# Patient Record
Sex: Male | Born: 1945 | Race: White | Hispanic: No | Marital: Married | Smoking: Current some day smoker
Health system: Southern US, Community
[De-identification: ages and names within clinical notes are randomized; demographics above are authoritative.]

## PROBLEM LIST (undated history)

## (undated) DIAGNOSIS — R911 Solitary pulmonary nodule: Secondary | ICD-10-CM

## (undated) DIAGNOSIS — I2510 Atherosclerotic heart disease of native coronary artery without angina pectoris: Secondary | ICD-10-CM

## (undated) DIAGNOSIS — F17200 Nicotine dependence, unspecified, uncomplicated: Secondary | ICD-10-CM

## (undated) DIAGNOSIS — S82853A Displaced trimalleolar fracture of unspecified lower leg, initial encounter for closed fracture: Secondary | ICD-10-CM

## (undated) DIAGNOSIS — I77810 Thoracic aortic ectasia: Secondary | ICD-10-CM

## (undated) DIAGNOSIS — M62830 Muscle spasm of back: Secondary | ICD-10-CM

## (undated) DIAGNOSIS — I82409 Acute embolism and thrombosis of unspecified deep veins of unspecified lower extremity: Secondary | ICD-10-CM

## (undated) DIAGNOSIS — I255 Ischemic cardiomyopathy: Secondary | ICD-10-CM

## (undated) DIAGNOSIS — R413 Other amnesia: Secondary | ICD-10-CM

## (undated) DIAGNOSIS — N529 Male erectile dysfunction, unspecified: Secondary | ICD-10-CM

## (undated) DIAGNOSIS — M7742 Metatarsalgia, left foot: Secondary | ICD-10-CM

## (undated) DIAGNOSIS — I420 Dilated cardiomyopathy: Secondary | ICD-10-CM

## (undated) DIAGNOSIS — C61 Malignant neoplasm of prostate: Secondary | ICD-10-CM

## (undated) DIAGNOSIS — L719 Rosacea, unspecified: Secondary | ICD-10-CM

## (undated) DIAGNOSIS — M1990 Unspecified osteoarthritis, unspecified site: Secondary | ICD-10-CM

## (undated) DIAGNOSIS — R296 Repeated falls: Secondary | ICD-10-CM

## (undated) DIAGNOSIS — M25469 Effusion, unspecified knee: Secondary | ICD-10-CM

## (undated) DIAGNOSIS — R2681 Unsteadiness on feet: Secondary | ICD-10-CM

## (undated) DIAGNOSIS — I10 Essential (primary) hypertension: Secondary | ICD-10-CM

## (undated) DIAGNOSIS — R3911 Hesitancy of micturition: Secondary | ICD-10-CM

## (undated) DIAGNOSIS — E789 Disorder of lipoprotein metabolism, unspecified: Secondary | ICD-10-CM

## (undated) DIAGNOSIS — M7740 Metatarsalgia, unspecified foot: Secondary | ICD-10-CM

## (undated) DIAGNOSIS — F0280 Dementia in other diseases classified elsewhere without behavioral disturbance: Secondary | ICD-10-CM

## (undated) DIAGNOSIS — I491 Atrial premature depolarization: Secondary | ICD-10-CM

## (undated) DIAGNOSIS — I441 Atrioventricular block, second degree: Secondary | ICD-10-CM

## (undated) DIAGNOSIS — E119 Type 2 diabetes mellitus without complications: Secondary | ICD-10-CM

## (undated) DIAGNOSIS — M1120 Other chondrocalcinosis, unspecified site: Secondary | ICD-10-CM

## (undated) DIAGNOSIS — M419 Scoliosis, unspecified: Secondary | ICD-10-CM

## (undated) DIAGNOSIS — C801 Malignant (primary) neoplasm, unspecified: Secondary | ICD-10-CM

## (undated) DIAGNOSIS — F1910 Other psychoactive substance abuse, uncomplicated: Secondary | ICD-10-CM

## (undated) DIAGNOSIS — Z87442 Personal history of urinary calculi: Secondary | ICD-10-CM

## (undated) DIAGNOSIS — E876 Hypokalemia: Secondary | ICD-10-CM

## (undated) DIAGNOSIS — M2040 Other hammer toe(s) (acquired), unspecified foot: Secondary | ICD-10-CM

## (undated) DIAGNOSIS — G4700 Insomnia, unspecified: Secondary | ICD-10-CM

## (undated) DIAGNOSIS — E785 Hyperlipidemia, unspecified: Secondary | ICD-10-CM

## (undated) DIAGNOSIS — N289 Disorder of kidney and ureter, unspecified: Secondary | ICD-10-CM

## (undated) HISTORY — DX: Acute embolism and thrombosis of unspecified deep veins of unspecified lower extremity: I82.409

## (undated) HISTORY — DX: Displaced trimalleolar fracture of unspecified lower leg, initial encounter for closed fracture: S82.853A

## (undated) HISTORY — DX: Disorder of kidney and ureter, unspecified: N28.9

## (undated) HISTORY — DX: Nicotine dependence, unspecified, uncomplicated: F17.200

## (undated) HISTORY — PX: FOOT FRACTURE SURGERY: SHX645

## (undated) HISTORY — DX: Other psychoactive substance abuse, uncomplicated: F19.10

## (undated) HISTORY — DX: Hyperlipidemia, unspecified: E78.5

## (undated) HISTORY — DX: Hypokalemia: E87.6

## (undated) HISTORY — DX: Other amnesia: R41.3

## (undated) HISTORY — DX: Unsteadiness on feet: R26.81

## (undated) HISTORY — DX: Metatarsalgia, left foot: M77.42

## (undated) HISTORY — DX: Dilated cardiomyopathy: I42.0

## (undated) HISTORY — DX: Type 2 diabetes mellitus without complications: E11.9

## (undated) HISTORY — DX: Male erectile dysfunction, unspecified: N52.9

## (undated) HISTORY — DX: Essential (primary) hypertension: I10

## (undated) HISTORY — DX: Atherosclerotic heart disease of native coronary artery without angina pectoris: I25.10

## (undated) HISTORY — DX: Atrioventricular block, second degree: I44.1

## (undated) HISTORY — DX: Atrial premature depolarization: I49.1

## (undated) HISTORY — DX: Other hammer toe(s) (acquired), unspecified foot: M20.40

## (undated) HISTORY — DX: Thoracic aortic ectasia: I77.810

## (undated) HISTORY — DX: Repeated falls: R29.6

## (undated) HISTORY — DX: Dementia in other diseases classified elsewhere, unspecified severity, without behavioral disturbance, psychotic disturbance, mood disturbance, and anxiety: F02.80

## (undated) HISTORY — DX: Insomnia, unspecified: G47.00

## (undated) HISTORY — PX: COLONOSCOPY: SHX174

## (undated) HISTORY — DX: Solitary pulmonary nodule: R91.1

## (undated) HISTORY — DX: Malignant neoplasm of prostate: C61

## (undated) HISTORY — DX: Other chondrocalcinosis, unspecified site: M11.20

## (undated) HISTORY — PX: FOOT SURGERY: SHX648

## (undated) HISTORY — DX: Rosacea, unspecified: L71.9

## (undated) HISTORY — DX: Hesitancy of micturition: R39.11

## (undated) HISTORY — PX: LITHOTRIPSY: SUR834

## (undated) HISTORY — DX: Scoliosis, unspecified: M41.9

## (undated) HISTORY — DX: Dilated cardiomyopathy: I25.5

## (undated) HISTORY — DX: Muscle spasm of back: M62.830

## (undated) HISTORY — DX: Effusion, unspecified knee: M25.469

## (undated) HISTORY — DX: Disorder of lipoprotein metabolism, unspecified: E78.9

---

## 1982-04-05 HISTORY — PX: VASECTOMY: SHX75

## 1988-04-05 HISTORY — PX: MOUTH SURGERY: SHX715

## 2005-08-19 ENCOUNTER — Emergency Department (HOSPITAL_COMMUNITY): Admission: EM | Admit: 2005-08-19 | Discharge: 2005-08-20 | Payer: Self-pay | Admitting: Emergency Medicine

## 2005-08-23 ENCOUNTER — Other Ambulatory Visit (HOSPITAL_COMMUNITY): Admission: RE | Admit: 2005-08-23 | Discharge: 2005-09-22 | Payer: Self-pay | Admitting: Psychiatry

## 2005-08-25 ENCOUNTER — Ambulatory Visit: Payer: Self-pay | Admitting: Psychiatry

## 2005-09-22 ENCOUNTER — Other Ambulatory Visit (HOSPITAL_COMMUNITY): Admission: RE | Admit: 2005-09-22 | Discharge: 2005-12-21 | Payer: Self-pay | Admitting: Psychiatry

## 2005-11-17 DIAGNOSIS — F1910 Other psychoactive substance abuse, uncomplicated: Secondary | ICD-10-CM

## 2005-11-17 HISTORY — DX: Other psychoactive substance abuse, uncomplicated: F19.10

## 2011-06-02 ENCOUNTER — Encounter: Payer: Self-pay | Admitting: Internal Medicine

## 2011-08-11 ENCOUNTER — Encounter: Payer: Self-pay | Admitting: Internal Medicine

## 2011-10-01 ENCOUNTER — Ambulatory Visit (AMBULATORY_SURGERY_CENTER): Payer: Managed Care, Other (non HMO) | Admitting: *Deleted

## 2011-10-01 ENCOUNTER — Encounter: Payer: Self-pay | Admitting: Internal Medicine

## 2011-10-01 VITALS — Ht 71.0 in | Wt 186.0 lb

## 2011-10-01 DIAGNOSIS — Z1211 Encounter for screening for malignant neoplasm of colon: Secondary | ICD-10-CM

## 2011-10-01 MED ORDER — MOVIPREP 100 G PO SOLR: ORAL | Status: DC

## 2011-10-15 ENCOUNTER — Encounter: Payer: Self-pay | Admitting: Internal Medicine

## 2011-10-15 ENCOUNTER — Ambulatory Visit (AMBULATORY_SURGERY_CENTER): Payer: Medicare Other | Admitting: Internal Medicine

## 2011-10-15 VITALS — BP 137/64 | HR 59 | Temp 97.3°F | Resp 20 | Ht 71.0 in | Wt 186.0 lb

## 2011-10-15 DIAGNOSIS — Z1211 Encounter for screening for malignant neoplasm of colon: Secondary | ICD-10-CM

## 2011-10-15 MED ORDER — SODIUM CHLORIDE 0.9 % IV SOLN: 500.0000 mL | INTRAVENOUS | Status: DC

## 2011-10-15 NOTE — Op Note (Signed)
[facility] [address] [place], [place]  [address]  COLONOSCOPY PROCEDURE REPORT  PATIENT:  Brent Griffin, Brent Griffin  MR#:  [id number] BIRTHDATE:  17-Apr-1945, 66 yrs. old  GENDER:  male ENDOSCOPIST:  Hedwig Morton. Juanda Chance, MD REF. BY:  Nolon Nations, M.D. PROCEDURE DATE:  10/15/2011 PROCEDURE:  Colonoscopy 19147 ASA CLASS:  Class II INDICATIONS:  colorectal cancer screening, average risk last colon 2003 MEDICATIONS:   MAC sedation, administered by CRNA, propofol (Diprivan) 200 mg  DESCRIPTION OF PROCEDURE:   After the risks and benefits and of the procedure were explained, informed consent was obtained. Digital rectal exam was performed and revealed no rectal masses. The LB CF-[id number] endoscope was introduced through the anus and advanced to the cecum, which was identified by both the appendix and ileocecal valve.  The quality of the prep was good, using MoviPrep.  The instrument was then slowly withdrawn as the colon was fully examined. <<PROCEDUREIMAGES>>  FINDINGS:  Mild diverticulosis was found in the sigmoid colon (see image1).  Internal Hemorrhoids were found (see image7 and image6). This was otherwise a normal examination of the colon (see image5, image4, image3, and image2).   Retroflexed views in the rectum revealed no abnormalities.    The scope was then withdrawn from the patient and the procedure completed.  COMPLICATIONS:  None ENDOSCOPIC IMPRESSION: 1) Mild diverticulosis in the sigmoid colon 2) Internal hemorrhoids 3) Otherwise normal examination RECOMMENDATIONS: 1) High fiber diet.  REPEAT EXAM:  In 10 year(s) for.  ______________________________ Hedwig Morton. Juanda Chance, MD  CC:  n. eSIGNED:   Hedwig Morton. Brodie at 10/15/2011 08:29 AM  Karrie Meres, [phone]

## 2011-10-15 NOTE — Progress Notes (Signed)
Patient did not experience any of the following events: a burn prior to discharge; a fall within the facility; wrong site/side/patient/procedure/implant event; or a hospital transfer or hospital admission upon discharge from the facility. (G8907) Patient did not have preoperative order for IV antibiotic SSI prophylaxis. (G8918)  

## 2011-10-15 NOTE — Patient Instructions (Addendum)
Discharge instructions given with verbal understanding. Handouts on diverticulosis and hemorrhoids given. Resume previous medications. YOU HAD AN ENDOSCOPIC PROCEDURE TODAY AT THE  ENDOSCOPY CENTER: Refer to the procedure report that was given to you for any specific questions about what was found during the examination.  If the procedure report does not answer your questions, please call your gastroenterologist to clarify.  If you requested that your care partner not be given the details of your procedure findings, then the procedure report has been included in a sealed envelope for you to review at your convenience later.  YOU SHOULD EXPECT: Some feelings of bloating in the abdomen. Passage of more gas than usual.  Walking can help get rid of the air that was put into your GI tract during the procedure and reduce the bloating. If you had a lower endoscopy (such as a colonoscopy or flexible sigmoidoscopy) you may notice spotting of blood in your stool or on the toilet paper. If you underwent a bowel prep for your procedure, then you may not have a normal bowel movement for a few days.  DIET: Your first meal following the procedure should be a light meal and then it is ok to progress to your normal diet.  A half-sandwich or bowl of soup is an example of a good first meal.  Heavy or fried foods are harder to digest and may make you feel nauseous or bloated.  Likewise meals heavy in dairy and vegetables can cause extra gas to form and this can also increase the bloating.  Drink plenty of fluids but you should avoid alcoholic beverages for 24 hours.  ACTIVITY: Your care partner should take you home directly after the procedure.  You should plan to take it easy, moving slowly for the rest of the day.  You can resume normal activity the day after the procedure however you should NOT DRIVE or use heavy machinery for 24 hours (because of the sedation medicines used during the test).    SYMPTOMS TO REPORT  IMMEDIATELY: A gastroenterologist can be reached at any hour.  During normal business hours, 8:30 AM to 5:00 PM Monday through Friday, call [phone].  After hours and on weekends, please call the GI answering service at [phone] who will take a message and have the physician on call contact you.   Following lower endoscopy (colonoscopy or flexible sigmoidoscopy):  Excessive amounts of blood in the stool  Significant tenderness or worsening of abdominal pains  Swelling of the abdomen that is new, acute  Fever of 100F or higher  FOLLOW UP: If any biopsies were taken you will be contacted by phone or by letter within the next 1-3 weeks.  Call your gastroenterologist if you have not heard about the biopsies in 3 weeks.  Our staff will call the home number listed on your records the next business day following your procedure to check on you and address any questions or concerns that you may have at that time regarding the information given to you following your procedure. This is a courtesy call and so if there is no answer at the home number and we have not heard from you through the emergency physician on call, we will assume that you have returned to your regular daily activities without incident.  SIGNATURES/CONFIDENTIALITY: You and/or your care partner have signed paperwork which will be entered into your electronic medical record.  These signatures attest to the fact that that the information above on your After Visit   Summary has been reviewed and is understood.  Full responsibility of the confidentiality of this discharge information lies with you and/or your care-partner. 

## 2011-10-15 NOTE — Progress Notes (Signed)
Propofol per j nulty crna. See scanned intra procedure report. ewm 

## 2011-10-18 ENCOUNTER — Telehealth: Payer: Self-pay | Admitting: *Deleted

## 2011-10-18 NOTE — Telephone Encounter (Signed)
  No answer at number given.  Let message on voicemail.

## 2013-05-13 DIAGNOSIS — E119 Type 2 diabetes mellitus without complications: Secondary | ICD-10-CM | POA: Insufficient documentation

## 2013-12-17 DIAGNOSIS — M62838 Other muscle spasm: Secondary | ICD-10-CM | POA: Insufficient documentation

## 2014-01-06 DIAGNOSIS — R972 Elevated prostate specific antigen [PSA]: Secondary | ICD-10-CM | POA: Insufficient documentation

## 2014-02-12 DIAGNOSIS — I10 Essential (primary) hypertension: Secondary | ICD-10-CM | POA: Insufficient documentation

## 2014-02-13 DIAGNOSIS — M419 Scoliosis, unspecified: Secondary | ICD-10-CM | POA: Insufficient documentation

## 2014-02-13 DIAGNOSIS — M62830 Muscle spasm of back: Secondary | ICD-10-CM | POA: Insufficient documentation

## 2014-02-13 DIAGNOSIS — L719 Rosacea, unspecified: Secondary | ICD-10-CM | POA: Insufficient documentation

## 2014-08-27 DIAGNOSIS — E1169 Type 2 diabetes mellitus with other specified complication: Secondary | ICD-10-CM | POA: Insufficient documentation

## 2014-08-27 DIAGNOSIS — E785 Hyperlipidemia, unspecified: Secondary | ICD-10-CM

## 2014-10-11 ENCOUNTER — Encounter: Payer: Self-pay | Admitting: Internal Medicine

## 2016-01-16 DIAGNOSIS — C61 Malignant neoplasm of prostate: Secondary | ICD-10-CM | POA: Insufficient documentation

## 2016-04-05 HISTORY — PX: ROTATOR CUFF REPAIR: SHX139

## 2016-05-25 ENCOUNTER — Ambulatory Visit (INDEPENDENT_AMBULATORY_CARE_PROVIDER_SITE_OTHER): Payer: Medicare Other | Admitting: Podiatry

## 2016-05-25 ENCOUNTER — Encounter: Payer: Self-pay | Admitting: Podiatry

## 2016-05-25 VITALS — BP 141/85 | HR 70 | Ht 70.0 in | Wt 197.0 lb

## 2016-05-25 DIAGNOSIS — B351 Tinea unguium: Secondary | ICD-10-CM

## 2016-05-25 DIAGNOSIS — M2040 Other hammer toe(s) (acquired), unspecified foot: Secondary | ICD-10-CM

## 2016-05-25 DIAGNOSIS — Q828 Other specified congenital malformations of skin: Secondary | ICD-10-CM

## 2016-05-25 DIAGNOSIS — M79672 Pain in left foot: Secondary | ICD-10-CM

## 2016-05-25 DIAGNOSIS — M79671 Pain in right foot: Secondary | ICD-10-CM | POA: Diagnosis not present

## 2016-05-25 DIAGNOSIS — M21969 Unspecified acquired deformity of unspecified lower leg: Secondary | ICD-10-CM | POA: Diagnosis not present

## 2016-05-25 NOTE — Patient Instructions (Signed)
Seen for painful calluses and evaluation for orthotics. X-ray findings reveal short first ray with elevated position. Contracted lesser digits with digital corns. All lesions debrided. Both feet casted for orthotics.

## 2016-05-25 NOTE — Progress Notes (Signed)
SUBJECTIVE: 71 y.o. year old male presents requesting a new pair of orthotics.  Patient brought in a homemade orthotics that was made by [facility] that lasted 4 years. He has had podiatrist x 2 who were retired. Stated that he has on going callus problem on bottom of both feet, always had bad ankle that turns easily. Does a lot of walking in woods with dog.  REVIEW OF SYSTEMS: A comprehensive review of systems was negative except for: Currently going through Prostate cancer treatment with Radiation therapy, Type II diabetic under control, and Scoliosis. Positive of on going foot calluses.  OBJECTIVE: DERMATOLOGIC EXAMINATION: Digital keratotic skin lesions at distal end of 3rd digiti bilateral. Keratotic lesions sub 1 bilateral, sub 3 left, sub 4 right, distal clivi 3rd bilateral.  VASCULAR EXAMINATION OF LOWER LIMBS: All pedal pulses are palpable with normal pulsation.  No edema or erythema noted. Temperature gradient from tibial crest to dorsum of foot is within normal bilateral.  NEUROLOGIC EXAMINATION OF THE LOWER LIMBS: All epicritic and tactile sensations grossly intact.   MUSCULOSKELETAL EXAMINATION: Positive for hypermobile first ray bilateral. Severe digital contracture 2-5 bilateral.   RADIOGRAPHIC STUDIES:  AP View:  Short first and long 2nd and 3rd metatarsal bilateral: Lateral view:  High arch foot with plantar calcaneal spur bilateral. Elevated first ray right.  ASSESSMENT: Multiple digital contracture with digital lesions.  Pain under plantar callus. Hypermobile first ray bilateral.  PLAN: Reviewed clinical findings and available treatment options. All lesions debrided. Both feet casted for orthotics.

## 2016-07-21 ENCOUNTER — Ambulatory Visit (INDEPENDENT_AMBULATORY_CARE_PROVIDER_SITE_OTHER): Payer: Medicare Other | Admitting: Podiatry

## 2016-07-21 ENCOUNTER — Encounter: Payer: Self-pay | Admitting: Podiatry

## 2016-07-21 DIAGNOSIS — M79672 Pain in left foot: Secondary | ICD-10-CM

## 2016-07-21 DIAGNOSIS — M79671 Pain in right foot: Secondary | ICD-10-CM | POA: Diagnosis not present

## 2016-07-21 DIAGNOSIS — S93402A Sprain of unspecified ligament of left ankle, initial encounter: Secondary | ICD-10-CM

## 2016-07-21 DIAGNOSIS — Q661 Congenital talipes calcaneovarus, unspecified foot: Secondary | ICD-10-CM

## 2016-07-21 DIAGNOSIS — B351 Tinea unguium: Secondary | ICD-10-CM

## 2016-07-21 DIAGNOSIS — Q828 Other specified congenital malformations of skin: Secondary | ICD-10-CM

## 2016-07-21 DIAGNOSIS — M21969 Unspecified acquired deformity of unspecified lower leg: Secondary | ICD-10-CM | POA: Diagnosis not present

## 2016-07-21 NOTE — Progress Notes (Signed)
SUBJECTIVE: 71 y.o. year old male presents stating Orthotics are doing great except his left ankle wants to turn in. Something need be done to prevent it.  Patient also request calluses and nails trimmed.  He is to have shoulder surgery tomorrow.  REVIEW OF SYSTEMS: A comprehensive review of systems was negative except for: Currently going through Prostate cancer treatment with Radiation therapy, Type II diabetic under control, and Scoliosis. Positive of on going foot calluses.  OBJECTIVE: DERMATOLOGIC EXAMINATION: Digital keratotic skin lesions at distal end of 3rd digiti bilateral. Keratotic lesions sub 1 bilateral, sub 3 left, sub 4 right, distal clivi 3rd bilateral.  VASCULAR EXAMINATION OF LOWER LIMBS: All pedal pulses are palpable with normal pulsation.  No edema or erythema noted. Temperature gradient from tibial crest to dorsum of foot is within normal bilateral.  NEUROLOGIC EXAMINATION OF THE LOWER LIMBS: All epicritic and tactile sensations grossly intact.   MUSCULOSKELETAL EXAMINATION: Positive for hypermobile first ray bilateral. Severe digital contracture 2-5 bilateral.  Rearfoot varus left with frequent turning.   RADIOGRAPHIC STUDIES:  AP View:  Short first and long 2nd and 3rd metatarsal bilateral: Lateral view:  High arch foot with plantar calcaneal spur bilateral. Elevated first ray right.  ASSESSMENT: Multiple digital contracture with digital lesions.  Pain under plantar callus. Hypermobile first ray bilateral. 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   PLAN: Reviewed clinical findings and available treatment options. All lesions and nails debrided. Ankle brace dispensed to prevent ankle sprain left.  Return in 2 month for palliation.

## 2016-07-21 NOTE — Patient Instructions (Signed)
Doing good with orthotics. All nails and calluses debrided. Ankle brace dispensed for problematic left ankle that turns in. Return in 2 months for RFC.

## 2016-07-29 ENCOUNTER — Ambulatory Visit: Payer: Medicare Other | Admitting: Podiatry

## 2016-09-14 ENCOUNTER — Ambulatory Visit (INDEPENDENT_AMBULATORY_CARE_PROVIDER_SITE_OTHER): Payer: Medicare Other | Admitting: Podiatry

## 2016-09-14 ENCOUNTER — Encounter: Payer: Self-pay | Admitting: Podiatry

## 2016-09-14 DIAGNOSIS — M79671 Pain in right foot: Secondary | ICD-10-CM | POA: Diagnosis not present

## 2016-09-14 DIAGNOSIS — Q828 Other specified congenital malformations of skin: Secondary | ICD-10-CM

## 2016-09-14 DIAGNOSIS — M79672 Pain in left foot: Secondary | ICD-10-CM

## 2016-09-14 DIAGNOSIS — B351 Tinea unguium: Secondary | ICD-10-CM

## 2016-09-14 NOTE — Progress Notes (Signed)
SUBJECTIVE: 71 y.o.year old malepresents requesting calluses and nails trimmed.   He is wearing Orthotics and left ankle brace, and doing well. Has had shoulder sugery right side 07/22/16 and recovering well.  OBJECTIVE: DERMATOLOGIC EXAMINATION: Digital keratotic skin lesions at distal end of 3rd digiti bilateral. Keratotic lesions sub 1 bilateral, sub 3 left, sub 4 right, distal clivi 3rd bilateral. Hypertrophic nails with fungal debris x 10.  VASCULAR EXAMINATION OF LOWER LIMBS: All pedal pulses are palpable with normal pulsation.  No edema or erythema noted. Temperature gradient from tibial crest to dorsum of foot is within normal bilateral.  NEUROLOGIC EXAMINATION OF THE LOWER LIMBS: All epicritic and tactile sensations grossly intact.   MUSCULOSKELETAL EXAMINATION: Positive for hypermobile first ray bilateral. Severe digital contracture 2-5 bilateral.  Rearfoot varus left with frequent turning.    ASSESSMENT: Onychomycosis x 10. Multiple digital contracture with digital lesions.  Pain under plantar callus. 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   PLAN: Reviewed clinical findings and available treatment options. All lesions and nails debrided. Return in 2 month for palliation.

## 2016-09-14 NOTE — Patient Instructions (Signed)
Seen for hypertrophic nails and painful calluses. All nails and calluses debrided. Return in 2 months or as needed.

## 2016-11-03 ENCOUNTER — Ambulatory Visit (INDEPENDENT_AMBULATORY_CARE_PROVIDER_SITE_OTHER): Payer: Medicare Other | Admitting: Podiatry

## 2016-11-03 ENCOUNTER — Encounter: Payer: Self-pay | Admitting: Podiatry

## 2016-11-03 DIAGNOSIS — M79672 Pain in left foot: Secondary | ICD-10-CM | POA: Diagnosis not present

## 2016-11-03 DIAGNOSIS — M79671 Pain in right foot: Secondary | ICD-10-CM

## 2016-11-03 DIAGNOSIS — B351 Tinea unguium: Secondary | ICD-10-CM | POA: Diagnosis not present

## 2016-11-03 NOTE — Patient Instructions (Signed)
Seen for hypertrophic nails. All nails debrided. Return in 2 months or as needed.  

## 2016-11-03 NOTE — Progress Notes (Signed)
SUBJECTIVE: 71 y.o.year old malepresents requesting calluses and nails trimmed. He was seen 2 months ago for routine foot care and requested to return in 2 month again.  He is wearing Orthotics and left ankle brace, and doing well. Has had shoulder sugery right side 07/22/16 and recovering well.  OBJECTIVE: DERMATOLOGIC EXAMINATION: Digital keratotic skin lesions at distal end of 3rd digiti bilateral. Keratotic lesions sub 1 bilateral, sub 3 left, sub 4 right, distal clivi 3rd bilateral. Hypertrophic nails with fungal debris x 10.  VASCULAR EXAMINATION OF LOWER LIMBS: All pedal pulses are palpable with normal pulsation.  No edema or erythema noted. Temperature gradient from tibial crest to dorsum of foot is within normal bilateral.  NEUROLOGIC EXAMINATION OF THE LOWER LIMBS: All epicritic and tactile sensations grossly intact.   MUSCULOSKELETAL EXAMINATION: Positive for hypermobile first ray bilateral. Severe digital contracture 2-5 bilateral.  Rearfoot varus left with frequent turning.    ASSESSMENT: Onychomycosis x 10. Multiple digital contracture with digital lesions.  Pain under plantar callus. 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   PLAN: Reviewed clinical findings and available treatment options. All lesions and nails debrided. Bleeding nail cleansed with iodine and dressing applied. Return in 2 month for palliation.

## 2016-11-09 ENCOUNTER — Ambulatory Visit: Payer: Medicare Other | Admitting: Podiatry

## 2017-01-03 ENCOUNTER — Ambulatory Visit: Payer: Medicare Other | Admitting: Podiatry

## 2017-02-22 ENCOUNTER — Ambulatory Visit: Payer: Medicare Other | Admitting: Podiatry

## 2017-02-28 ENCOUNTER — Ambulatory Visit (INDEPENDENT_AMBULATORY_CARE_PROVIDER_SITE_OTHER): Payer: Medicare Other | Admitting: Podiatry

## 2017-02-28 ENCOUNTER — Encounter: Payer: Self-pay | Admitting: Podiatry

## 2017-02-28 DIAGNOSIS — M79672 Pain in left foot: Secondary | ICD-10-CM | POA: Diagnosis not present

## 2017-02-28 DIAGNOSIS — B351 Tinea unguium: Secondary | ICD-10-CM

## 2017-02-28 DIAGNOSIS — M79671 Pain in right foot: Secondary | ICD-10-CM | POA: Diagnosis not present

## 2017-02-28 DIAGNOSIS — M2040 Other hammer toe(s) (acquired), unspecified foot: Secondary | ICD-10-CM

## 2017-02-28 DIAGNOSIS — M21969 Unspecified acquired deformity of unspecified lower leg: Secondary | ICD-10-CM

## 2017-02-28 NOTE — Patient Instructions (Signed)
Seen for hypertrophic nails and calluses. All nails and calluses debrided. Return in 1 month.

## 2017-02-28 NOTE — Progress Notes (Signed)
Subjective: 71 y.o. year old male patient presents complaining of painful nails. Patient requests toe nails and calluses trimmed.  Been using scrub stick to remove callus from the ball of left foot for the last 2 weeks. He has a vacation planned in January and expect to be on feet long hours. Last blood sugar reading was 84 yesterday.   Hx of right shoulder surgery 07/22/16 and has weak hand on right side. Been wearing Orthotics and left ankle brace with satisfaction.  Objective: Dermatologic: Digital keratotic lesions 3rd distal end bilateral. Porokeratosis under 1st MPJ bilateral, under 3rd MPJ left, 4th MPJ right, plantar heel left. Thick dystrophic nails x 10. Vascular: Pedal pulses are all palpable. Orthopedic: Contracted lesser digits 2-5 bilateral. Hypermobile first ray bilateral. Compensated rearfoot varus left. Neurologic: All epicritic and tactile sensations grossly intact.  Assessment: Dystrophic mycotic nails x 10. Pre ulcerative porokeratotic lesions plantar bilateral with digital lesions bilateral. 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 Diabetic under control.  Treatment: All mycotic nails, corns, calluses debrided.  Return in one month to repeat trimming before taking off for vacation.

## 2017-04-12 ENCOUNTER — Encounter: Payer: Self-pay | Admitting: Podiatry

## 2017-04-12 ENCOUNTER — Ambulatory Visit (INDEPENDENT_AMBULATORY_CARE_PROVIDER_SITE_OTHER): Payer: Medicare Other | Admitting: Podiatry

## 2017-04-12 DIAGNOSIS — M79672 Pain in left foot: Secondary | ICD-10-CM

## 2017-04-12 DIAGNOSIS — Q828 Other specified congenital malformations of skin: Secondary | ICD-10-CM | POA: Diagnosis not present

## 2017-04-12 DIAGNOSIS — M79671 Pain in right foot: Secondary | ICD-10-CM | POA: Diagnosis not present

## 2017-04-12 DIAGNOSIS — M21969 Unspecified acquired deformity of unspecified lower leg: Secondary | ICD-10-CM | POA: Diagnosis not present

## 2017-04-12 DIAGNOSIS — B351 Tinea unguium: Secondary | ICD-10-CM | POA: Diagnosis not present

## 2017-04-12 NOTE — Patient Instructions (Signed)
Seen for painful calluses and hypertrophic nails. All nails and calluses debrided. Return 6 weeks.

## 2017-04-12 NOTE — Progress Notes (Signed)
Subjective: 72 y.o. year old male patient presents complaining of pain in bottom of both feet since the end of December. He was seen on 02/28/17.   Hx of right shoulder surgery 07/22/16 and has weak hand on right side. Been wearing Orthotics and left ankle brace with satisfaction.  Objective: Dermatologic: Pre ulcerative keratotic lesion at distal end 3rd digit bilateral. Severe porokeratotic lesion sub 1 bilateral, under 3rd MPJ left, 4th MPJ right. Hypertrophic nails x 10. Vascular: Pedal pulses are all palpable. Orthopedic: Contracted lesser digits 2-5 bilateral. Hypermobile first ray bilateral. Compensated rearfoot varus left. Neurologic: All epicritic and tactile sensations grossly intact.  Assessment: Dystrophic mycotic nails x 10.  Treatment: All mycotic nails, corns, calluses debrided.  Return in 6 weeks or sooner if needed.

## 2017-05-24 ENCOUNTER — Ambulatory Visit (INDEPENDENT_AMBULATORY_CARE_PROVIDER_SITE_OTHER): Payer: Medicare Other | Admitting: Podiatry

## 2017-05-24 ENCOUNTER — Encounter: Payer: Self-pay | Admitting: Podiatry

## 2017-05-24 DIAGNOSIS — M79672 Pain in left foot: Secondary | ICD-10-CM | POA: Diagnosis not present

## 2017-05-24 DIAGNOSIS — B351 Tinea unguium: Secondary | ICD-10-CM

## 2017-05-24 DIAGNOSIS — M79671 Pain in right foot: Secondary | ICD-10-CM | POA: Diagnosis not present

## 2017-05-24 NOTE — Patient Instructions (Signed)
Seen for hypertrophic nails and painful calluses. All nails and calluses debrided. Return in 3 months or as needed.  

## 2017-05-24 NOTE — Progress Notes (Signed)
Subjective: 72 y.o. year old male patient presents complaining of painful nails and calluses. Patient requests toe nails, corns and calluses trimmed. Had a trip and did well.  Hx of right shoulder surgery 07/22/16 and has weak hand on right side. Been wearing Orthotics and left ankle brace with satisfaction.  Objective: Dermatologic: Thick yellow deformed nails x 10. Plantar callus distal end 3rd bilateral. Porokeratosis under 3rd and 4th MPJ right. Vascular: Pedal pulses are all palpable. Orthopedic: Contracted lesser digits  2-5 bilateral. Hypermobile first ray bilateral. Compensated rearfoot varus left. Neurologic: All epicritic and tactile sensations grossly intact.  Assessment: Dystrophic mycotic nails x 10. Painful porokeratosis. Digital contracture with painful corns and calluses.  Treatment: All mycotic nails, corns, calluses debrided.  Return in 3 months or sooner if needed.

## 2017-08-22 ENCOUNTER — Ambulatory Visit: Payer: Medicare Other | Admitting: Podiatry

## 2017-09-08 DIAGNOSIS — R3911 Hesitancy of micturition: Secondary | ICD-10-CM | POA: Insufficient documentation

## 2017-09-14 DIAGNOSIS — N5235 Erectile dysfunction following radiation therapy: Secondary | ICD-10-CM | POA: Insufficient documentation

## 2017-09-26 ENCOUNTER — Encounter: Payer: Self-pay | Admitting: Podiatry

## 2017-09-26 ENCOUNTER — Ambulatory Visit (INDEPENDENT_AMBULATORY_CARE_PROVIDER_SITE_OTHER): Payer: Medicare Other | Admitting: Podiatry

## 2017-09-26 DIAGNOSIS — B351 Tinea unguium: Secondary | ICD-10-CM

## 2017-09-26 DIAGNOSIS — M79672 Pain in left foot: Secondary | ICD-10-CM | POA: Diagnosis not present

## 2017-09-26 DIAGNOSIS — M79671 Pain in right foot: Secondary | ICD-10-CM | POA: Diagnosis not present

## 2017-09-26 NOTE — Progress Notes (Signed)
Subjective: 72 y.o. year old male patient presents requesting toe nails, corns and calluses trimmed. He is on his way out abroad next week.  Hx of right shoulder surgery 07/22/16 and has weak hand on right side. Been wearing Orthotics and left ankle brace with satisfaction.  Objective: Dermatologic: Thick yellow deformed nails x 10. Plantar callus distal end 3rd bilateral. Porokeratosis under 3rd and 4th MPJ right. Vascular: Pedal pulses are all palpable. Orthopedic: Contracted lesser digits  2-5 bilateral. Hypermobile first ray bilateral. Compensated rearfoot varus left. Neurologic: All epicritic and tactile sensations grossly intact.  Assessment: Dystrophic mycotic nails x 10. Painful porokeratosis. Digital contracture with painful corns and calluses.  Treatment: All mycotic nails, corns, calluses debrided.  Return in 3 months or sooner if needed.

## 2017-09-26 NOTE — Patient Instructions (Signed)
Seen for hypertrophic nails, corns, and calluses. All nails, corns, and calluses debrided. Return in 3 months or as needed.  

## 2017-12-01 DIAGNOSIS — M542 Cervicalgia: Secondary | ICD-10-CM | POA: Insufficient documentation

## 2018-03-10 DIAGNOSIS — F17200 Nicotine dependence, unspecified, uncomplicated: Secondary | ICD-10-CM | POA: Insufficient documentation

## 2018-03-10 DIAGNOSIS — F5102 Adjustment insomnia: Secondary | ICD-10-CM | POA: Insufficient documentation

## 2018-03-10 DIAGNOSIS — N289 Disorder of kidney and ureter, unspecified: Secondary | ICD-10-CM | POA: Insufficient documentation

## 2018-03-13 DIAGNOSIS — E876 Hypokalemia: Secondary | ICD-10-CM | POA: Insufficient documentation

## 2018-04-05 DIAGNOSIS — C801 Malignant (primary) neoplasm, unspecified: Secondary | ICD-10-CM

## 2018-04-05 HISTORY — DX: Malignant (primary) neoplasm, unspecified: C80.1

## 2018-04-19 ENCOUNTER — Other Ambulatory Visit: Payer: Self-pay

## 2018-04-20 ENCOUNTER — Encounter: Payer: Self-pay | Admitting: Podiatry

## 2018-04-20 ENCOUNTER — Ambulatory Visit (INDEPENDENT_AMBULATORY_CARE_PROVIDER_SITE_OTHER): Payer: Medicare Other | Admitting: Podiatry

## 2018-04-20 DIAGNOSIS — Q828 Other specified congenital malformations of skin: Secondary | ICD-10-CM | POA: Diagnosis not present

## 2018-04-20 DIAGNOSIS — E1142 Type 2 diabetes mellitus with diabetic polyneuropathy: Secondary | ICD-10-CM

## 2018-04-20 DIAGNOSIS — B351 Tinea unguium: Secondary | ICD-10-CM | POA: Diagnosis not present

## 2018-04-23 NOTE — Progress Notes (Signed)
Subjective:  Patient ID: [name]., male    DOB: 04/14/1945,  MRN: [id number]  Chief Complaint  Patient presents with  . Callouses    diabetic foot exam/debridement of bilateral painful nails and callouses    73 y.o. male presents  for diabetic foot care. Last AMBS was unknown last A1c 6.1. Reports numbness and tingling in their feet. Denies cramping in legs and thighs.  Review of Systems: Negative except as noted in the HPI. Denies N/V/F/Ch.  Past Medical History:  Diagnosis Date  . Diabetes mellitus   . Hyperlipidemia   . Hypertension   . 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  amLODipine (NORVASC) 10 MG tablet, Take 10 mg by mouth., Disp: , Rfl:  .  aspirin (GOODSENSE ASPIRIN) 325 MG tablet, Take by mouth., Disp: , Rfl:  .  baclofen (LIORESAL) 10 MG tablet, , Disp: , Rfl:  .  cyclobenzaprine (FLEXERIL) 10 MG tablet, Take 10 mg by mouth 3 (three) times daily as needed., Disp: , Rfl:  .  doxycycline (VIBRA-TABS) 100 MG tablet, Take 100 mg by mouth., Disp: , Rfl:  .  Folic Acid-Vit G2-RKY H06 (FA-VITAMIN B-6-VITAMIN B-12) 2.2-25-0.5 MG TABS, Take by mouth., Disp: , Rfl:  .  hydrochlorothiazide (HYDRODIURIL) 25 MG tablet, , Disp: , Rfl:  .  losartan (COZAAR) 100 MG tablet, , Disp: , Rfl:  .  metFORMIN (GLUCOPHAGE) 500 MG tablet, Take 500 mg by mouth., Disp: , Rfl:  .  metoprolol succinate (TOPROL-XL) 100 MG 24 hr tablet, , Disp: , Rfl:  .  Multiple Vitamin (MULTI-VITAMINS) TABS, Take by mouth., Disp: , Rfl:  .  naproxen (NAPROSYN) 500 MG tablet, Take 500 mg by mouth., Disp: , Rfl:  .  ONETOUCH DELICA LANCETS 23J MISC, USE ONE LANCET DAILY TO TEST BLOOD GLUCOSE, Disp: , Rfl: 3 .  ONETOUCH VERIO test strip, USE ONE STRIP DAILY TO TEST BLOOD GLUCOSE., Disp: , Rfl: 3 .  rosuvastatin (CRESTOR) 5 MG tablet, Take 5 mg by mouth., Disp: , Rfl:  .  tamsulosin (FLOMAX) 0.4 MG CAPS capsule, , Disp: , Rfl:  .  valsartan (DIOVAN)  320 MG tablet, Take 320 mg by mouth daily., Disp: , Rfl:  .  carbamide peroxide (DEBROX) 6.5 % OTIC solution, Place 5 drops in affected ear(s) four times daily for 1 week, Disp: , Rfl:   Social History   Tobacco Use  Smoking Status Current Some Day Smoker  . Packs/day: 0.10  . Types: Cigarettes  Smokeless Tobacco Never Used    No Known Allergies Objective:  There were no vitals filed for this visit. There is no height or weight on file to calculate BMI. Constitutional Well developed. Well nourished.  Vascular Dorsalis pedis pulses present 1+ bilaterally  Posterior tibial pulses 1+ bilaterally  Pedal hair growth diminished. Capillary refill normal to all digits.  No cyanosis or clubbing noted.  Neurologic Normal speech. Oriented to person, place, and time. Epicritic sensation to light touch grossly present bilaterally. Protective sensation with 5.07 monofilament  present bilaterally. Vibratory sensation present bilaterally.  Dermatologic Nails elongated, thickened, dystrophic. No open wounds. HPKs sub 3/4 MPJ R  Orthopedic: Normal joint ROM without pain or crepitus bilaterally. No visible deformities. No bony tenderness.   Assessment:   1. DM type 2 with diabetic peripheral neuropathy ([place])   2. Onychomycosis   3. Porokeratosis    Plan:  Patient was evaluated and treated and all questions  answered.  Diabetes with DPN, Onychomycosis -Educated on diabetic footcare. Diabetic risk level 1 -Nails x10 debrided sharply and manually with large nail nipper and rotary burr.   Procedure: Nail Debridement Rationale: Patient meets criteria for routine foot care due to PAd Type of Debridement: manual, sharp debridement. Instrumentation: Nail nipper, rotary burr. Number of Nails: 10   Procedure: Paring of Lesion Rationale: painful hyperkeratotic lesion Type of Debridement: manual, sharp debridement. Instrumentation: 312 blade Number of Lesions: 2   Will discuss surgical  intervention to prevent further callus formation in the future.  No follow-ups on file.

## 2018-05-11 ENCOUNTER — Ambulatory Visit (INDEPENDENT_AMBULATORY_CARE_PROVIDER_SITE_OTHER): Payer: Medicare Other

## 2018-05-11 ENCOUNTER — Other Ambulatory Visit: Payer: Self-pay | Admitting: Podiatry

## 2018-05-11 ENCOUNTER — Ambulatory Visit (INDEPENDENT_AMBULATORY_CARE_PROVIDER_SITE_OTHER): Payer: Medicare Other | Admitting: Podiatry

## 2018-05-11 DIAGNOSIS — M2012 Hallux valgus (acquired), left foot: Secondary | ICD-10-CM

## 2018-05-11 DIAGNOSIS — M79671 Pain in right foot: Secondary | ICD-10-CM

## 2018-05-11 DIAGNOSIS — M21962 Unspecified acquired deformity of left lower leg: Secondary | ICD-10-CM

## 2018-05-11 DIAGNOSIS — M79672 Pain in left foot: Secondary | ICD-10-CM

## 2018-05-11 DIAGNOSIS — M2041 Other hammer toe(s) (acquired), right foot: Secondary | ICD-10-CM

## 2018-05-11 DIAGNOSIS — M21961 Unspecified acquired deformity of right lower leg: Secondary | ICD-10-CM

## 2018-05-11 DIAGNOSIS — M2042 Other hammer toe(s) (acquired), left foot: Secondary | ICD-10-CM | POA: Diagnosis not present

## 2018-05-11 NOTE — Patient Instructions (Signed)
Pre-Operative Instructions  Congratulations, you have decided to take an important step towards improving your quality of life.  You can be assured that the doctors and staff at [facility] will be with you every step of the way.  Here are some important things you should know:  1. Plan to be at the surgery center/hospital at least 1 (one) hour prior to your scheduled time, unless otherwise directed by the surgical center/hospital staff.  You must have a responsible adult accompany you, remain during the surgery and drive you home.  Make sure you have directions to the surgical center/hospital to ensure you arrive on time. 2. If you are having surgery at [facility] or [facility], you will need a copy of your medical history and physical form from your family physician within one month prior to the date of surgery. We will give you a form for your primary physician to complete.  3. We make every effort to accommodate the date you request for surgery.  However, there are times where surgery dates or times have to be moved.  We will contact you as soon as possible if a change in schedule is required.   4. No aspirin/ibuprofen for one week before surgery.  If you are on aspirin, any non-steroidal anti-inflammatory medications (Mobic, Aleve, Ibuprofen) should not be taken seven (7) days prior to your surgery.  You make take Tylenol for pain prior to surgery.  5. Medications - If you are taking daily heart and blood pressure medications, seizure, reflux, allergy, asthma, anxiety, pain or diabetes medications, make sure you notify the surgery center/hospital before the day of surgery so they can tell you which medications you should take or avoid the day of surgery. 6. No food or drink after midnight the night before surgery unless directed otherwise by surgical center/hospital staff. 7. No alcoholic beverages 24-hours prior to surgery.  No smoking 24-hours prior or 24-hours after  surgery. 8. Wear loose pants or shorts. They should be loose enough to fit over bandages, boots, and casts. 9. Don't wear slip-on shoes. Sneakers are preferred. 10. Bring your boot with you to the surgery center/hospital.  Also bring crutches or a walker if your physician has prescribed it for you.  If you do not have this equipment, it will be provided for you after surgery. 11. If you have not been contacted by the surgery center/hospital by the day before your surgery, call to confirm the date and time of your surgery. 12. Leave-time from work may vary depending on the type of surgery you have.  Appropriate arrangements should be made prior to surgery with your employer. 13. Prescriptions will be provided immediately following surgery by your doctor.  Fill these as soon as possible after surgery and take the medication as directed. Pain medications will not be refilled on weekends and must be approved by the doctor. 14. Remove nail polish on the operative foot and avoid getting pedicures prior to surgery. 15. Wash the night before surgery.  The night before surgery wash the foot and leg well with water and the antibacterial soap provided. Be sure to pay special attention to beneath the toenails and in between the toes.  Wash for at least three (3) minutes. Rinse thoroughly with water and dry well with a towel.  Perform this wash unless told not to do so by your physician.  Enclosed: 1 Ice pack (please put in freezer the night before surgery)   1 Hibiclens skin cleaner     Pre-op instructions  If you have any questions regarding the instructions, please do not hesitate to call our office.  [place]: [address], [place], [place] [address] -- [phone]  [place]: [address] [place], [place] [address] -- [phone]  [place]: [address]  [place], [place] [address] -- [phone]  [place]: [address], [address], [place], [place] [address] -- [phone]  Website:  [email or web address] 

## 2018-06-03 NOTE — Progress Notes (Signed)
Subjective:  Patient ID: [name]., male    DOB: June 15, 1945,  MRN: [id number]  Chief Complaint  Patient presents with  . Callouses    Pt states bilateral plantar callouses, and callouses on bilateral 3rd toes.    73 y.o. male presents  for diabetic foot care. Last AMBS was unknown last A1c 6.1. Reports numbness and tingling in their feet. Denies cramping in legs and thighs.  Review of Systems: Negative except as noted in the HPI. Denies N/V/F/Ch.  Past Medical History:  Diagnosis Date  . Diabetes mellitus   . Hyperlipidemia   . Hypertension   . 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  amLODipine (NORVASC) 10 MG tablet, Take 10 mg by mouth., Disp: , Rfl:  .  aspirin (GOODSENSE ASPIRIN) 325 MG tablet, Take by mouth., Disp: , Rfl:  .  baclofen (LIORESAL) 10 MG tablet, , Disp: , Rfl:  .  carbamide peroxide (DEBROX) 6.5 % OTIC solution, Place 5 drops in affected ear(s) four times daily for 1 week, Disp: , Rfl:  .  cyclobenzaprine (FLEXERIL) 10 MG tablet, Take 10 mg by mouth 3 (three) times daily as needed., Disp: , Rfl:  .  doxycycline (VIBRA-TABS) 100 MG tablet, Take 100 mg by mouth., Disp: , Rfl:  .  Folic Acid-Vit V7-CHY I50 (FA-VITAMIN B-6-VITAMIN B-12) 2.2-25-0.5 MG TABS, Take by mouth., Disp: , Rfl:  .  hydrochlorothiazide (HYDRODIURIL) 25 MG tablet, , Disp: , Rfl:  .  losartan (COZAAR) 100 MG tablet, , Disp: , Rfl:  .  metFORMIN (GLUCOPHAGE) 500 MG tablet, Take 500 mg by mouth., Disp: , Rfl:  .  metoprolol succinate (TOPROL-XL) 100 MG 24 hr tablet, , Disp: , Rfl:  .  Multiple Vitamin (MULTI-VITAMINS) TABS, Take by mouth., Disp: , Rfl:  .  naproxen (NAPROSYN) 500 MG tablet, Take 500 mg by mouth., Disp: , Rfl:  .  ONETOUCH DELICA LANCETS 27X MISC, USE ONE LANCET DAILY TO TEST BLOOD GLUCOSE, Disp: , Rfl: 3 .  ONETOUCH VERIO test strip, USE ONE STRIP DAILY TO TEST BLOOD GLUCOSE., Disp: , Rfl: 3 .  rosuvastatin  (CRESTOR) 5 MG tablet, Take 5 mg by mouth., Disp: , Rfl:  .  tamsulosin (FLOMAX) 0.4 MG CAPS capsule, , Disp: , Rfl:  .  tiZANidine (ZANAFLEX) 4 MG tablet, , Disp: , Rfl:  .  valsartan (DIOVAN) 320 MG tablet, Take 320 mg by mouth daily., Disp: , Rfl:   Social History   Tobacco Use  Smoking Status Current Some Day Smoker  . Packs/day: 0.10  . Types: Cigarettes  Smokeless Tobacco Never Used    No Known Allergies Objective:  There were no vitals filed for this visit. There is no height or weight on file to calculate BMI. Constitutional Well developed. Well nourished.  Vascular Dorsalis pedis pulses present 1+ bilaterally  Posterior tibial pulses 1+ bilaterally  Pedal hair growth diminished. Capillary refill normal to all digits.  No cyanosis or clubbing noted.  Neurologic Normal speech. Oriented to person, place, and time. Epicritic sensation to light touch grossly present bilaterally. Protective sensation with 5.07 monofilament  present bilaterally. Vibratory sensation present bilaterally.  Dermatologic Nails elongated, thickened, dystrophic. No open wounds. HPKs sub 3/4 MPJ R  Orthopedic: Normal joint ROM without pain or crepitus bilaterally. Hallux interphalangeus left, prominent metatarsal heads 2/3 left, hammertoe left 2nd/3rd  toe left, right 3rd toe   Assessment:   1. Metatarsal deformity, right  2. Metatarsal deformity, left   3. Hallux interphalangeus, acquired, left   4. Hammertoe of left foot   5. Hammertoe of right foot    Plan:  Patient was evaluated and treated and all questions answered.  Left Great Toe Hallux Interphalangeus, metatarsal deformity, hammertoes -Discussed etiology of the above deformities and their role in formation of calluses.  Discussed definitive surgical correction of the multiple deformities and attempt to alleviate the conditions. -Patient has failed all conservative therapy and wishes to proceed with surgical intervention. All  risks, benefits, and alternatives discussed with patient. No guarantees given. Consent reviewed and signed by patient. -Planned procedures: Left great toe straightening osteotomy, second metatarsal shortening osteotomy, third metatarsal shortening osteotomy, correction second hammertoe, third hammertoe, excision of lesion from heel, correction hammertoe right third toe with flexor tenotomy  Return in about 9 weeks (around 07/13/2018) for Diabetic Foot Care.

## 2018-07-13 ENCOUNTER — Ambulatory Visit: Payer: Medicare Other | Admitting: Podiatry

## 2018-12-05 HISTORY — PX: FOOT SURGERY: SHX648

## 2020-03-27 ENCOUNTER — Emergency Department (HOSPITAL_BASED_OUTPATIENT_CLINIC_OR_DEPARTMENT_OTHER): Payer: Medicare Other

## 2020-03-27 ENCOUNTER — Other Ambulatory Visit: Payer: Self-pay

## 2020-03-27 ENCOUNTER — Emergency Department (HOSPITAL_BASED_OUTPATIENT_CLINIC_OR_DEPARTMENT_OTHER)
Admission: EM | Admit: 2020-03-27 | Discharge: 2020-03-27 | Disposition: A | Discharge status: Home / Self Care | Payer: Medicare Other | Attending: Emergency Medicine | Admitting: Emergency Medicine

## 2020-03-27 ENCOUNTER — Encounter (HOSPITAL_BASED_OUTPATIENT_CLINIC_OR_DEPARTMENT_OTHER): Payer: Self-pay | Admitting: *Deleted

## 2020-03-27 DIAGNOSIS — I10 Essential (primary) hypertension: Secondary | ICD-10-CM | POA: Diagnosis not present

## 2020-03-27 DIAGNOSIS — Z7984 Long term (current) use of oral hypoglycemic drugs: Secondary | ICD-10-CM | POA: Insufficient documentation

## 2020-03-27 DIAGNOSIS — S42402A Unspecified fracture of lower end of left humerus, initial encounter for closed fracture: Secondary | ICD-10-CM

## 2020-03-27 DIAGNOSIS — F17210 Nicotine dependence, cigarettes, uncomplicated: Secondary | ICD-10-CM | POA: Diagnosis not present

## 2020-03-27 DIAGNOSIS — W19XXXA Unspecified fall, initial encounter: Secondary | ICD-10-CM | POA: Insufficient documentation

## 2020-03-27 DIAGNOSIS — Z20822 Contact with and (suspected) exposure to covid-19: Secondary | ICD-10-CM | POA: Diagnosis not present

## 2020-03-27 DIAGNOSIS — Z7982 Long term (current) use of aspirin: Secondary | ICD-10-CM | POA: Diagnosis not present

## 2020-03-27 DIAGNOSIS — S40922A Unspecified superficial injury of left upper arm, initial encounter: Secondary | ICD-10-CM | POA: Diagnosis present

## 2020-03-27 DIAGNOSIS — E1169 Type 2 diabetes mellitus with other specified complication: Secondary | ICD-10-CM | POA: Diagnosis not present

## 2020-03-27 DIAGNOSIS — Z79899 Other long term (current) drug therapy: Secondary | ICD-10-CM | POA: Insufficient documentation

## 2020-03-27 LAB — RESP PANEL BY RT-PCR (FLU A&B, COVID) ARPGX2
Influenza A by PCR: NEGATIVE
Influenza B by PCR: NEGATIVE
SARS Coronavirus 2 by RT PCR: NEGATIVE

## 2020-03-27 MED ORDER — OXYCODONE-ACETAMINOPHEN 5-325 MG PO TABS
1.0000 | ORAL_TABLET | Freq: Once | ORAL | Status: AC
  Administered 2020-03-27: 1 via ORAL
  Filled 2020-03-27: qty 1

## 2020-03-27 MED ORDER — OXYCODONE-ACETAMINOPHEN 5-325 MG PO TABS: 1.0000 | ORAL_TABLET | ORAL | 0 refills | Status: DC | PRN

## 2020-03-27 NOTE — ED Provider Notes (Signed)
[name] EMERGENCY DEPARTMENT Provider Note   CSN: [id number] Arrival date & time: 03/27/20  1212     History Chief Complaint  Patient presents with  . Fall  . Arm Injury    Brent Griffin. is a 74 y.o. male.  The history is provided by the patient and medical records. No language interpreter was used.  Arm Injury Location:  Elbow Elbow location:  L elbow Injury: yes   Time since incident:  1 day Mechanism of injury: fall   Pain details:    Quality:  Aching Handedness:  Ambidextrous Dislocation: no   Prior injury to area:  Yes Relieved by:  Nothing Worsened by:  Movement Ineffective treatments:  None tried Associated symptoms: no back pain, no fatigue, no fever, no muscle weakness, no neck pain and no numbness        Past Medical History:  Diagnosis Date  . Diabetes mellitus   . Hyperlipidemia   . Hypertension   . Substance abuse ([place]) [id number]   former alcoholic     Patient Active Problem List   Diagnosis Date Noted  . Hypokalemia 03/13/2018  . Adjustment insomnia 03/10/2018  . Renal insufficiency 03/10/2018  . Tobacco use disorder 03/10/2018  . Neck pain 12/01/2017  . Erectile dysfunction following radiation therapy 09/14/2017  . Urinary hesitancy 09/08/2017  . Prostate cancer ([place]) 01/16/2016  . Hyperlipidemia LDL goal <100 08/27/2014  . Hyperlipidemia associated with type 2 diabetes mellitus ([place]) 08/27/2014  . Muscle spasm of back 02/13/2014  . Rosacea 02/13/2014  . Scoliosis 02/13/2014  . Essential hypertension 02/12/2014  . Elevated prostate specific antigen (PSA) 01/06/2014  . Muscle spasms of neck 12/17/2013  . Type II diabetes mellitus ([place]) 05/13/2013    Past Surgical History:  Procedure Laterality Date  . COLONOSCOPY    . FOOT SURGERY    . MOUTH SURGERY  1990  . VASECTOMY  1984       Family History  Problem Relation Age of Onset  . Colon cancer Other 76    Social History   Tobacco Use  . Smoking  status: Current Some Day Smoker    Packs/day: 0.10    Types: Cigarettes  . Smokeless tobacco: Never Used  Substance Use Topics  . Alcohol use: No    Comment: former  . Drug use: No    Home Medications Prior to Admission medications   Medication Sig Start Date End Date Taking? Authorizing Provider  amLODipine (NORVASC) 10 MG tablet Take 10 mg by mouth. 01/19/16   [provider]  aspirin (GOODSENSE ASPIRIN) 325 MG tablet Take by mouth. 11/07/10   [provider]  baclofen (LIORESAL) 10 MG tablet  02/28/16   [provider]  carbamide peroxide (DEBROX) 6.5 % OTIC solution Place 5 drops in affected ear(s) four times daily for 1 week 10/20/16   [provider]  cyclobenzaprine (FLEXERIL) 10 MG tablet Take 10 mg by mouth 3 (three) times daily as needed.    [provider]  doxycycline (VIBRA-TABS) 100 MG tablet Take 100 mg by mouth. 01/19/16   [provider]  Folic Acid-Vit Q000111Q 123456 (FA-VITAMIN B-6-VITAMIN B-12) 2.2-25-0.5 MG TABS Take by mouth. 02/09/16   [provider]  hydrochlorothiazide (HYDRODIURIL) 25 MG tablet  02/28/16   [provider]  losartan (COZAAR) 100 MG tablet  04/20/16   [provider]  metFORMIN (GLUCOPHAGE) 500 MG tablet Take 500 mg by mouth. 03/10/16   [provider]  metoprolol succinate (  TOPROL-XL) 100 MG 24 hr tablet  05/18/16   [provider]  Multiple Vitamin (MULTI-VITAMINS) TABS Take by mouth.    [provider]  naproxen (NAPROSYN) 500 MG tablet Take 500 mg by mouth. 12/03/15   [provider]  Dola [place]yle LANCETS 33G MISC USE ONE LANCET DAILY TO TEST BLOOD GLUCOSE 05/16/16   [provider]  ONETOUCH VERIO test strip USE ONE STRIP DAILY TO TEST BLOOD GLUCOSE. 05/15/16   [provider]  rosuvastatin (CRESTOR) 5 MG tablet Take 5 mg by mouth. 04/20/16   [provider]  tamsulosin (FLOMAX) 0.4 MG CAPS capsule  05/04/16    [provider]  tiZANidine (ZANAFLEX) 4 MG tablet  04/21/18   [provider]  valsartan (DIOVAN) 320 MG tablet Take 320 mg by mouth daily.    [provider]    Allergies    Patient has no known allergies.  Review of Systems   Review of Systems  Constitutional: Negative for chills, diaphoresis, fatigue and fever.  HENT: Negative for congestion.   Eyes: Negative for visual disturbance.  Respiratory: Negative for cough, shortness of breath and wheezing.   Cardiovascular: Negative for chest pain.  Gastrointestinal: Negative for abdominal pain, constipation, diarrhea, nausea and vomiting.  Musculoskeletal: Negative for arthralgias, back pain, neck pain and neck stiffness.  Skin: Negative for wound.  Neurological: Negative for headaches.  Psychiatric/Behavioral: Negative for agitation and confusion.  All other systems reviewed and are negative.   Physical Exam Updated Vital Signs BP 138/76 (BP Location: Right Arm)   Pulse (!) 33   Temp 98.1 F (36.7 C) (Oral)   Resp 20   Ht 5\' 10"  (1.778 m)   Wt 87.1 kg   SpO2 100%   BMI 27.55 kg/m   Physical Exam Vitals and nursing note reviewed.  Constitutional:      General: He is not in acute distress.    Appearance: He is well-developed and well-nourished. He is not ill-appearing, toxic-appearing or diaphoretic.  HENT:     Head: Normocephalic and atraumatic.     Nose: Nose normal. No congestion or rhinorrhea.     Mouth/Throat:     Mouth: Mucous membranes are moist.     Pharynx: No oropharyngeal exudate or posterior oropharyngeal erythema.  Eyes:     Extraocular Movements: Extraocular movements intact.     Conjunctiva/sclera: Conjunctivae normal.     Pupils: Pupils are equal, round, and reactive to light.  Cardiovascular:     Rate and Rhythm: Normal rate and regular rhythm.     Heart sounds: No murmur heard.   Pulmonary:     Effort: Pulmonary effort is normal. No respiratory distress.     Breath  sounds: Normal breath sounds. No wheezing, rhonchi or rales.  Chest:     Chest wall: No tenderness.  Abdominal:     General: Abdomen is flat.     Palpations: Abdomen is soft.     Tenderness: There is no abdominal tenderness. There is no right CVA tenderness, left CVA tenderness, guarding or rebound.  Musculoskeletal:        General: Swelling, tenderness, deformity and signs of injury present. No edema.     Cervical back: Neck supple. No tenderness.     Left lower leg: No edema.  Skin:    General: Skin is warm and dry.     Capillary Refill: Capillary refill takes less than 2 seconds.     Findings: No erythema or rash.  Neurological:  General: No focal deficit present.     Mental Status: He is alert and oriented to person, place, and time.     Sensory: No sensory deficit.     Motor: No weakness.  Psychiatric:        Mood and Affect: Mood and affect and mood normal.     ED Results / Procedures / Treatments   Labs (all labs ordered are listed, but only abnormal results are displayed) Labs Reviewed  RESP PANEL BY RT-PCR (FLU A&B, COVID) [id number]    EKG None  Radiology CT Elbow Left Wo Contrast  Result Date: 03/27/2020 CLINICAL DATA:  Left elbow swelling and bruising after falling yesterday. Reported elbow fracture on outside radiographs. EXAM: CT OF THE UPPER LEFT EXTREMITY WITHOUT CONTRAST TECHNIQUE: Multidetector CT imaging of the left elbow was performed according to the standard protocol. COMPARISON:  None. FINDINGS: Bones/Joint/Cartilage There is a comminuted and moderately displaced intercondylar fracture of the distal humerus. The medial humeral epicondyle demonstrates up to 10 mm of anterior and medial displacement relative to the remainder of the humerus. This fracture extends into the central articular surface of the trochlea. No involvement of the articular surface of the capitellum identified. The radiocapitellar and ulnohumeral articulations are intact. There are  probable intra-articular fracture fragments between the trochlea and olecranon process. There is spurring of the olecranon process without definite acute injury of the proximal ulna or radius. Moderate-sized elbow joint effusion noted. Ligaments Suboptimally assessed by CT. Muscles and Tendons The biceps and triceps tendons appear intact. No focal muscular hematoma identified. Soft tissues Diffuse soft tissue swelling around the elbow, greatest posteriorly. No focal hematoma, foreign body or soft tissue emphysema. IMPRESSION: 1. Comminuted and moderately displaced intercondylar fracture of the distal humerus as described, involving the articular surface of the trochlea. The medial humeral epicondyle demonstrates up to 10 mm of anterior and medial displacement relative to the remainder of the humerus. 2. Probable intra-articular fracture fragments between the trochlea and olecranon process. Moderate-sized elbow joint effusion. 3. Diffuse soft tissue swelling around the elbow, greatest posteriorly. Electronically Signed   By: Richardean Sale M.D.   On: 03/27/2020 15:28    Procedures Procedures (including critical care time)  Medications Ordered in ED Medications  oxyCODONE-acetaminophen (PERCOCET/ROXICET) 5-325 MG per tablet 1 tablet (1 tablet Oral Given 03/27/20 1416)    ED Course  I have reviewed the triage vital signs and the nursing notes.  Pertinent labs & imaging results that were available during my care of the patient were reviewed by me and considered in my medical decision making (see chart for details).    MDM Rules/Calculators/A&P                          Brent Griffin. is a 74 y.o. male with a past medical history significant for hypertension, hyperlipidemia, prior prostate cancer, and diabetes who presents for further management of elbow injury.  Patient reports that yesterday, he was walking his large dog when it jerked him to go see his wife and was dragged to the ground.   Patient landed on his elbow and sustained injury to it.  He reports that he saw an outpatient clinic with [facility] who did x-rays and then they called him today saying that he has a distal humerus fracture in the elbow.  Patient called his family who is good friends with the providers at [facility] and they put him in touch with  Dr. Paralee Cancel.  Dr. Ihor Gully told him to come to the emergency department for splint placement.  Patient reports he is having some elbow pain but otherwise denies numbness, tingling, or weakness in the hand.  He denies any feeling cold in the hand and has normal sensation.  He denies any other injuries or complaints.  Pain is moderate at this time.  He has swelling of the elbow.  On exam, he is swelling in the elbow and has tenderness.  He does have pain when I manipulate the elbow.  He has intact pulses, strength, and sensation with grip strength.  He has good cap refill.  It is not cold.  There is no evidence of acute compartment syndrome at this time.  Shoulder is nontender and lungs are clear and chest is nontender.  I spoke with Dr. Alvan Dame who requested we get a CT of the left elbow, swab him for Covid to help with perioperative management, and place him in a long-arm posterior slab splint with padding.  Patient will follow up with the orthopedics team with [facility] next week.  Patient was given a dose of pain medicine and will follow up with the orthopedics team.  He was given a prescription for pain medicine as well.  Patient understood plan of care and will be discharged after splint was placed.  Patient agreed with plan and knows to watch for signs and symptoms of developing compartment syndrome.   Final Clinical Impression(s) / ED Diagnoses Final diagnoses:  Closed fracture of left elbow, initial encounter  Fall, initial encounter    Rx / DC Orders ED Discharge Orders         Ordered    oxyCODONE-acetaminophen (PERCOCET/ROXICET) 5-325 MG  tablet  Every 4 hours PRN        03/27/20 1543          Clinical Impression: 1. Closed fracture of left elbow, initial encounter   2. Fall, initial encounter     Disposition: Discharge  Condition: Good  I have discussed the results, Dx and Tx plan with the pt(& family if present). He/she/they expressed understanding and agree(s) with the plan. Discharge instructions discussed at great length. Strict return precautions discussed and pt &/or family have verbalized understanding of the instructions. No further questions at time of discharge.    New Prescriptions   OXYCODONE-ACETAMINOPHEN (PERCOCET/ROXICET) 5-325 MG TABLET    Take 1 tablet by mouth every 4 (four) hours as needed for severe pain.    Follow Up: Paralee Cancel, [place] [address] [address] [phone]   with [facility]  [facility] [place] EMERGENCY DEPARTMENT [address] [id number] [address] [place] [phone]       Kohler Pellerito, Gwenyth Allegra, MD 03/27/20 1640

## 2020-03-27 NOTE — ED Notes (Signed)
Pt. To get pain meds and has a driver

## 2020-03-27 NOTE — Discharge Instructions (Addendum)
Your work-up today and imaging confirmed a distal humeral fracture at the elbow.  I spoke with your orthopedic team with Dr. Isa Rankin with [facility] and he requested the CT which we performed.  They will use this to help with operative planning.  He also requested a Covid test which we collected.  He requested a shoulder immobilizer and placing your elbow in a splint.  Please try to keep it elevated.  Please follow-up with them next week to discuss further management.  If any symptoms change or worsen including losing sensation in your fingers or fingers changing color, please return to the nearest emergency department.

## 2020-03-27 NOTE — ED Triage Notes (Addendum)
Fall yesterday. Left arm injury. His left elbow is swollen. He had an xray this am. He has the report. He is unable to get into an orthopedic office today.

## 2020-03-31 ENCOUNTER — Other Ambulatory Visit (HOSPITAL_COMMUNITY): Payer: Medicare Other

## 2020-03-31 ENCOUNTER — Other Ambulatory Visit (HOSPITAL_COMMUNITY)
Admission: RE | Admit: 2020-03-31 | Discharge: 2020-03-31 | Disposition: A | Discharge status: Home / Self Care | Payer: Medicare Other | Source: Ambulatory Visit | Attending: Orthopedic Surgery | Admitting: Orthopedic Surgery

## 2020-03-31 ENCOUNTER — Encounter (HOSPITAL_COMMUNITY): Payer: Self-pay | Admitting: Orthopedic Surgery

## 2020-03-31 ENCOUNTER — Other Ambulatory Visit: Payer: Self-pay

## 2020-03-31 DIAGNOSIS — Z01812 Encounter for preprocedural laboratory examination: Secondary | ICD-10-CM | POA: Insufficient documentation

## 2020-03-31 DIAGNOSIS — Z20822 Contact with and (suspected) exposure to covid-19: Secondary | ICD-10-CM | POA: Insufficient documentation

## 2020-03-31 NOTE — Progress Notes (Signed)
Mr. Brent Griffin denies chest pain or shortness of breath. Mr. Brent Griffin was tested for Covid today and is in quarantine with his wife.  Mr. Brent Griffin has type II diabetes. Patient no longer take Metformin- A1C is < 7.0. I instructed patient to check CBG after awaking and every 2 hours until arrival  to the hospital.  I Instructed patient if CBG is less than 70 to take 4 Glucose Tablets or 1 tube of Glucose Gel or 1/2 cup of a clear juice. Recheck CBG in 15 minutes then call pre- op desk at [phone] for further instructions. If scheduled to receive Insulin, do not take Insulin.

## 2020-04-01 ENCOUNTER — Ambulatory Visit (HOSPITAL_COMMUNITY): Payer: Medicare Other | Admitting: Certified Registered"

## 2020-04-01 ENCOUNTER — Inpatient Hospital Stay (HOSPITAL_COMMUNITY)
Admission: EM | Admit: 2020-04-01 | Discharge: 2020-04-07 | DRG: 492 | Disposition: A | Discharge status: Skilled Nursing Facility | Payer: Medicare Other | Attending: Orthopedic Surgery | Admitting: Orthopedic Surgery

## 2020-04-01 ENCOUNTER — Ambulatory Visit (HOSPITAL_COMMUNITY): Payer: Medicare Other

## 2020-04-01 ENCOUNTER — Encounter (HOSPITAL_COMMUNITY)
Admission: EM | Disposition: A | Discharge status: Skilled Nursing Facility | Payer: Self-pay | Source: Home / Self Care | Attending: Orthopedic Surgery

## 2020-04-01 ENCOUNTER — Encounter (HOSPITAL_COMMUNITY): Payer: Self-pay | Admitting: Orthopedic Surgery

## 2020-04-01 DIAGNOSIS — Y92019 Unspecified place in single-family (private) house as the place of occurrence of the external cause: Secondary | ICD-10-CM

## 2020-04-01 DIAGNOSIS — Z79899 Other long term (current) drug therapy: Secondary | ICD-10-CM

## 2020-04-01 DIAGNOSIS — I10 Essential (primary) hypertension: Secondary | ICD-10-CM | POA: Diagnosis present

## 2020-04-01 DIAGNOSIS — M7022 Olecranon bursitis, left elbow: Secondary | ICD-10-CM | POA: Diagnosis present

## 2020-04-01 DIAGNOSIS — E871 Hypo-osmolality and hyponatremia: Secondary | ICD-10-CM | POA: Diagnosis present

## 2020-04-01 DIAGNOSIS — W1839XA Other fall on same level, initial encounter: Secondary | ICD-10-CM | POA: Diagnosis present

## 2020-04-01 DIAGNOSIS — Z923 Personal history of irradiation: Secondary | ICD-10-CM

## 2020-04-01 DIAGNOSIS — F17210 Nicotine dependence, cigarettes, uncomplicated: Secondary | ICD-10-CM | POA: Diagnosis present

## 2020-04-01 DIAGNOSIS — S42402A Unspecified fracture of lower end of left humerus, initial encounter for closed fracture: Secondary | ICD-10-CM | POA: Diagnosis present

## 2020-04-01 DIAGNOSIS — E119 Type 2 diabetes mellitus without complications: Secondary | ICD-10-CM | POA: Diagnosis present

## 2020-04-01 DIAGNOSIS — D62 Acute posthemorrhagic anemia: Secondary | ICD-10-CM | POA: Diagnosis not present

## 2020-04-01 DIAGNOSIS — E876 Hypokalemia: Secondary | ICD-10-CM | POA: Diagnosis present

## 2020-04-01 DIAGNOSIS — Z800 Family history of malignant neoplasm of digestive organs: Secondary | ICD-10-CM

## 2020-04-01 DIAGNOSIS — Z87442 Personal history of urinary calculi: Secondary | ICD-10-CM

## 2020-04-01 DIAGNOSIS — Z419 Encounter for procedure for purposes other than remedying health state, unspecified: Secondary | ICD-10-CM

## 2020-04-01 DIAGNOSIS — Z23 Encounter for immunization: Secondary | ICD-10-CM

## 2020-04-01 DIAGNOSIS — Z8546 Personal history of malignant neoplasm of prostate: Secondary | ICD-10-CM

## 2020-04-01 DIAGNOSIS — G9341 Metabolic encephalopathy: Secondary | ICD-10-CM | POA: Diagnosis not present

## 2020-04-01 DIAGNOSIS — Z20822 Contact with and (suspected) exposure to covid-19: Secondary | ICD-10-CM | POA: Diagnosis present

## 2020-04-01 DIAGNOSIS — F17200 Nicotine dependence, unspecified, uncomplicated: Secondary | ICD-10-CM | POA: Diagnosis present

## 2020-04-01 DIAGNOSIS — Z7982 Long term (current) use of aspirin: Secondary | ICD-10-CM

## 2020-04-01 DIAGNOSIS — R059 Cough, unspecified: Secondary | ICD-10-CM

## 2020-04-01 DIAGNOSIS — E785 Hyperlipidemia, unspecified: Secondary | ICD-10-CM | POA: Diagnosis present

## 2020-04-01 DIAGNOSIS — S42492A Other displaced fracture of lower end of left humerus, initial encounter for closed fracture: Principal | ICD-10-CM | POA: Diagnosis present

## 2020-04-01 HISTORY — DX: Metatarsalgia, unspecified foot: M77.40

## 2020-04-01 HISTORY — PX: ORIF ELBOW FRACTURE: SHX5031

## 2020-04-01 HISTORY — DX: Unspecified osteoarthritis, unspecified site: M19.90

## 2020-04-01 HISTORY — DX: Personal history of urinary calculi: Z87.442

## 2020-04-01 HISTORY — PX: ULNAR NERVE TRANSPOSITION: SHX2595

## 2020-04-01 HISTORY — DX: Malignant (primary) neoplasm, unspecified: C80.1

## 2020-04-01 LAB — BASIC METABOLIC PANEL
Anion gap: 10 (ref 5–15)
BUN: 12 mg/dL (ref 8–23)
CO2: 25 mmol/L (ref 22–32)
Calcium: 8.7 mg/dL — ABNORMAL LOW (ref 8.9–10.3)
Chloride: 106 mmol/L (ref 98–111)
Creatinine, Ser: 0.89 mg/dL (ref 0.61–1.24)
GFR, Estimated: >60 mL/min (ref >60)
Glucose, Bld: 93 mg/dL (ref 70–99)
Potassium: 3.3 mmol/L — ABNORMAL LOW (ref 3.5–5.1)
Sodium: 141 mmol/L (ref 135–145)

## 2020-04-01 LAB — GLUCOSE, CAPILLARY
Glucose-Capillary: 92 mg/dL (ref 70–99)
Glucose-Capillary: 93 mg/dL (ref 70–99)
Glucose-Capillary: 75 mg/dL (ref 70–99)

## 2020-04-01 LAB — CBC
HCT: 41.3 % (ref 39.0–52.0)
Hemoglobin: 13.5 g/dL (ref 13.0–17.0)
MCH: 30.5 pg (ref 26.0–34.0)
MCHC: 32.7 g/dL (ref 30.0–36.0)
MCV: 93.2 fL (ref 80.0–100.0)
Platelets: 161 10*3/uL (ref 150–400)
RBC: 4.43 MIL/uL (ref 4.22–5.81)
RDW: 14.5 % (ref 11.5–15.5)
WBC: 5 10*3/uL (ref 4.0–10.5)
nRBC: 0 % (ref 0.0–0.2)

## 2020-04-01 LAB — SARS CORONAVIRUS 2 (TAT 6-24 HRS): SARS Coronavirus 2: NEGATIVE

## 2020-04-01 LAB — SURGICAL PCR SCREEN
MRSA, PCR: NEGATIVE
Staphylococcus aureus: POSITIVE — AB

## 2020-04-01 SURGERY — OPEN REDUCTION INTERNAL FIXATION (ORIF) ELBOW/OLECRANON FRACTURE
Anesthesia: Regional | Site: Elbow | Laterality: Left

## 2020-04-01 MED ORDER — 0.9 % SODIUM CHLORIDE (POUR BTL) OPTIME
TOPICAL | Status: DC | PRN
  Administered 2020-04-01: 16:00:00 1000 mL

## 2020-04-01 MED ORDER — FENTANYL CITRATE (PF) 100 MCG/2ML IJ SOLN
INTRAMUSCULAR | Status: DC | PRN
  Administered 2020-04-01: 100 ug via INTRAVENOUS

## 2020-04-01 MED ORDER — CHLORHEXIDINE GLUCONATE 0.12 % MT SOLN
15.0000 mL | Freq: Once | OROMUCOSAL | Status: AC
  Administered 2020-04-01: 13:00:00 15 mL via OROMUCOSAL
  Filled 2020-04-01: qty 15

## 2020-04-01 MED ORDER — FENTANYL CITRATE (PF) 100 MCG/2ML IJ SOLN
50.0000 ug | Freq: Once | INTRAMUSCULAR | Status: AC
  Filled 2020-04-01: qty 1

## 2020-04-01 MED ORDER — BUPIVACAINE HCL (PF) 0.25 % IJ SOLN
INTRAMUSCULAR | Status: AC
  Filled 2020-04-01: qty 30

## 2020-04-01 MED ORDER — OXYCODONE HCL 5 MG PO TABS: 5.0000 mg | ORAL_TABLET | Freq: Once | ORAL | Status: DC | PRN

## 2020-04-01 MED ORDER — OXYCODONE HCL 5 MG PO TABS
10.0000 mg | ORAL_TABLET | ORAL | Status: DC | PRN
  Administered 2020-04-02: 10 mg via ORAL
  Filled 2020-04-01 (×2): qty 2

## 2020-04-01 MED ORDER — PROMETHAZINE HCL 12.5 MG RE SUPP
12.5000 mg | Freq: Four times a day (QID) | RECTAL | Status: DC | PRN
  Filled 2020-04-01: qty 1

## 2020-04-01 MED ORDER — ONDANSETRON HCL 4 MG/2ML IJ SOLN: 4.0000 mg | Freq: Four times a day (QID) | INTRAMUSCULAR | Status: DC | PRN

## 2020-04-01 MED ORDER — HYDROMORPHONE HCL 1 MG/ML IJ SOLN
0.5000 mg | INTRAMUSCULAR | Status: DC | PRN
  Administered 2020-04-02: 1 mg via INTRAVENOUS
  Filled 2020-04-01: qty 1

## 2020-04-01 MED ORDER — LACTATED RINGERS IV SOLN: INTRAVENOUS | Status: DC

## 2020-04-01 MED ORDER — LOSARTAN POTASSIUM 50 MG PO TABS
100.0000 mg | ORAL_TABLET | Freq: Every day | ORAL | Status: DC
  Administered 2020-04-01 – 2020-04-07 (×7): 100 mg via ORAL
  Filled 2020-04-01 (×8): qty 2

## 2020-04-01 MED ORDER — FENTANYL CITRATE (PF) 100 MCG/2ML IJ SOLN: 25.0000 ug | INTRAMUSCULAR | Status: DC | PRN

## 2020-04-01 MED ORDER — MIDAZOLAM HCL 2 MG/2ML IJ SOLN
INTRAMUSCULAR | Status: AC
  Filled 2020-04-01: qty 2

## 2020-04-01 MED ORDER — OXYCODONE HCL 5 MG PO TABS
5.0000 mg | ORAL_TABLET | ORAL | Status: DC | PRN
  Administered 2020-04-02: 10 mg via ORAL
  Filled 2020-04-01: qty 1

## 2020-04-01 MED ORDER — FAMOTIDINE 20 MG PO TABS: 20.0000 mg | ORAL_TABLET | Freq: Two times a day (BID) | ORAL | Status: DC | PRN

## 2020-04-01 MED ORDER — METOPROLOL SUCCINATE ER 25 MG PO TB24
ORAL_TABLET | ORAL | Status: AC
  Filled 2020-04-01: qty 6

## 2020-04-01 MED ORDER — BUPIVACAINE-EPINEPHRINE (PF) 0.5% -1:200000 IJ SOLN
INTRAMUSCULAR | Status: DC | PRN
  Administered 2020-04-01: 30 mL via PERINEURAL

## 2020-04-01 MED ORDER — HYDROCHLOROTHIAZIDE 25 MG PO TABS
25.0000 mg | ORAL_TABLET | Freq: Every day | ORAL | Status: DC
Start: 2020-04-01 — End: 2020-04-08
  Administered 2020-04-01 – 2020-04-07 (×7): 25 mg via ORAL
  Filled 2020-04-01 (×7): qty 1

## 2020-04-01 MED ORDER — ONDANSETRON HCL 4 MG/2ML IJ SOLN: 4.0000 mg | Freq: Once | INTRAMUSCULAR | Status: DC | PRN

## 2020-04-01 MED ORDER — CEFAZOLIN SODIUM-DEXTROSE 2-4 GM/100ML-% IV SOLN
2.0000 g | INTRAVENOUS | Status: AC
  Administered 2020-04-01: 15:00:00 2 g via INTRAVENOUS
  Filled 2020-04-01: qty 100

## 2020-04-01 MED ORDER — LIDOCAINE 2% (20 MG/ML) 5 ML SYRINGE
INTRAMUSCULAR | Status: AC
  Filled 2020-04-01: qty 5

## 2020-04-01 MED ORDER — LIDOCAINE 2% (20 MG/ML) 5 ML SYRINGE
INTRAMUSCULAR | Status: DC | PRN
  Administered 2020-04-01: 40 mg via INTRAVENOUS

## 2020-04-01 MED ORDER — POTASSIUM CHLORIDE CRYS ER 20 MEQ PO TBCR
20.0000 meq | EXTENDED_RELEASE_TABLET | Freq: Two times a day (BID) | ORAL | Status: DC
  Administered 2020-04-01 – 2020-04-07 (×12): 20 meq via ORAL
  Filled 2020-04-01 (×12): qty 1

## 2020-04-01 MED ORDER — ONDANSETRON HCL 4 MG PO TABS: 4.0000 mg | ORAL_TABLET | Freq: Four times a day (QID) | ORAL | Status: DC | PRN

## 2020-04-01 MED ORDER — ORAL CARE MOUTH RINSE: 15.0000 mL | Freq: Once | OROMUCOSAL | Status: AC

## 2020-04-01 MED ORDER — ROSUVASTATIN CALCIUM 5 MG PO TABS
10.0000 mg | ORAL_TABLET | Freq: Every day | ORAL | Status: DC
  Administered 2020-04-01 – 2020-04-06 (×6): 10 mg via ORAL
  Filled 2020-04-01 (×6): qty 2

## 2020-04-01 MED ORDER — OXYCODONE HCL 5 MG/5ML PO SOLN
5.0000 mg | Freq: Once | ORAL | Status: DC | PRN
Start: 2020-04-01 — End: 2020-04-01

## 2020-04-01 MED ORDER — PROPOFOL 10 MG/ML IV BOLUS
INTRAVENOUS | Status: DC | PRN
Start: 2020-04-01 — End: 2020-04-01
  Administered 2020-04-01: 20 mg via INTRAVENOUS

## 2020-04-01 MED ORDER — FENTANYL CITRATE (PF) 250 MCG/5ML IJ SOLN
INTRAMUSCULAR | Status: AC
  Filled 2020-04-01: qty 5

## 2020-04-01 MED ORDER — MIDAZOLAM HCL 5 MG/5ML IJ SOLN
INTRAMUSCULAR | Status: DC | PRN
  Administered 2020-04-01: 2 mg via INTRAVENOUS

## 2020-04-01 MED ORDER — DOCUSATE SODIUM 100 MG PO CAPS
100.0000 mg | ORAL_CAPSULE | Freq: Two times a day (BID) | ORAL | Status: DC
  Administered 2020-04-01 – 2020-04-05 (×8): 100 mg via ORAL
  Filled 2020-04-01 (×12): qty 1

## 2020-04-01 MED ORDER — METHOCARBAMOL 500 MG PO TABS
500.0000 mg | ORAL_TABLET | Freq: Four times a day (QID) | ORAL | Status: DC | PRN
  Administered 2020-04-02 – 2020-04-03 (×2): 500 mg via ORAL
  Filled 2020-04-01 (×2): qty 1

## 2020-04-01 MED ORDER — SODIUM CHLORIDE 0.9 % IR SOLN
Status: DC | PRN
  Administered 2020-04-01: 3000 mL

## 2020-04-01 MED ORDER — PROPOFOL 10 MG/ML IV BOLUS
INTRAVENOUS | Status: AC
  Filled 2020-04-01: qty 20

## 2020-04-01 MED ORDER — METHOCARBAMOL 1000 MG/10ML IJ SOLN
500.0000 mg | Freq: Four times a day (QID) | INTRAVENOUS | Status: DC | PRN
  Filled 2020-04-01: qty 5

## 2020-04-01 MED ORDER — CEFAZOLIN SODIUM-DEXTROSE 1-4 GM/50ML-% IV SOLN
1.0000 g | Freq: Three times a day (TID) | INTRAVENOUS | Status: DC
  Administered 2020-04-02 – 2020-04-06 (×13): 1 g via INTRAVENOUS
  Filled 2020-04-01 (×13): qty 50

## 2020-04-01 MED ORDER — ACETAMINOPHEN 325 MG PO TABS
325.0000 mg | ORAL_TABLET | Freq: Four times a day (QID) | ORAL | Status: DC | PRN
  Administered 2020-04-03: 650 mg via ORAL
  Administered 2020-04-07: 325 mg via ORAL
  Filled 2020-04-01 (×2): qty 2

## 2020-04-01 MED ORDER — CEFAZOLIN SODIUM-DEXTROSE 1-4 GM/50ML-% IV SOLN
1.0000 g | INTRAVENOUS | Status: AC
  Administered 2020-04-01: 22:00:00 1 g via INTRAVENOUS
  Filled 2020-04-01: qty 50

## 2020-04-01 MED ORDER — ASCORBIC ACID 500 MG PO TABS
1000.0000 mg | ORAL_TABLET | Freq: Every day | ORAL | Status: DC
  Administered 2020-04-01 – 2020-04-07 (×7): 1000 mg via ORAL
  Filled 2020-04-01 (×7): qty 2

## 2020-04-01 MED ORDER — PROPOFOL 500 MG/50ML IV EMUL
INTRAVENOUS | Status: DC | PRN
  Administered 2020-04-01: 25 ug/kg/min via INTRAVENOUS

## 2020-04-01 MED ORDER — ACETAMINOPHEN 500 MG PO TABS
1000.0000 mg | ORAL_TABLET | Freq: Four times a day (QID) | ORAL | Status: AC
  Administered 2020-04-01 – 2020-04-02 (×4): 1000 mg via ORAL
  Filled 2020-04-01 (×5): qty 2

## 2020-04-01 MED ORDER — METOPROLOL SUCCINATE ER 50 MG PO TB24
150.0000 mg | ORAL_TABLET | Freq: Every day | ORAL | Status: DC
  Administered 2020-04-01 – 2020-04-07 (×7): 150 mg via ORAL
  Filled 2020-04-01 (×6): qty 1

## 2020-04-01 MED ORDER — FENTANYL CITRATE (PF) 100 MCG/2ML IJ SOLN
INTRAMUSCULAR | Status: AC
  Administered 2020-04-01: 14:00:00 50 ug via INTRAVENOUS
  Filled 2020-04-01: qty 2

## 2020-04-01 SURGICAL SUPPLY — 85 items
BIT DRILL 2.0 (BIT) ×2
BIT DRILL 2.5X2.75 QC CALB (BIT) ×2 IMPLANT
BIT DRILL 2XNS DISP SS SM FRAG (BIT) ×1 IMPLANT
BIT DRILL 4.8X300 (BIT) ×2 IMPLANT
BIT DRILL CALIBRATED 2.7 (BIT) ×2 IMPLANT
BIT DRL 2XNS DISP SS SM FRAG (BIT) ×1
BLADE LONG MED 31X9 (MISCELLANEOUS) ×2 IMPLANT
BNDG COHESIVE 4X5 TAN STRL (GAUZE/BANDAGES/DRESSINGS) IMPLANT
BNDG ELASTIC 3X5.8 VLCR STR LF (GAUZE/BANDAGES/DRESSINGS) IMPLANT
BNDG ELASTIC 4X5.8 VLCR STR LF (GAUZE/BANDAGES/DRESSINGS) ×8 IMPLANT
BNDG ESMARK 4X9 LF (GAUZE/BANDAGES/DRESSINGS) ×2 IMPLANT
BNDG GAUZE ELAST 4 BULKY (GAUZE/BANDAGES/DRESSINGS) ×4 IMPLANT
CABLE CERLAGE W/NEEDLE CRIMP (Cable) ×2 IMPLANT
CORD BIPOLAR FORCEPS 12FT (ELECTRODE) ×2 IMPLANT
COVER MAYO STAND STRL (DRAPES) ×2 IMPLANT
CUFF TOURN SGL QUICK 18X4 (TOURNIQUET CUFF) ×2 IMPLANT
CUFF TOURN SGL QUICK 24 (TOURNIQUET CUFF)
CUFF TRNQT CYL 24X4X16.5-23 (TOURNIQUET CUFF) IMPLANT
DRAPE C-ARM 42X72 X-RAY (DRAPES) ×4 IMPLANT
DRAPE INCISE IOBAN 66X45 STRL (DRAPES) ×2 IMPLANT
DRAPE OEC MINIVIEW 54X84 (DRAPES) IMPLANT
DRSG ADAPTIC 3X8 NADH LF (GAUZE/BANDAGES/DRESSINGS) ×2 IMPLANT
DRSG MEPITEL 4X7.2 (GAUZE/BANDAGES/DRESSINGS) ×2 IMPLANT
GAUZE SPONGE 4X4 12PLY STRL (GAUZE/BANDAGES/DRESSINGS) ×2 IMPLANT
GAUZE XEROFORM 1X8 LF (GAUZE/BANDAGES/DRESSINGS) IMPLANT
GAUZE XEROFORM 5X9 LF (GAUZE/BANDAGES/DRESSINGS) ×6 IMPLANT
GLOVE BIOGEL M 8.0 STRL (GLOVE) ×2 IMPLANT
GLOVE SS BIOGEL STRL SZ 8 (GLOVE) ×1 IMPLANT
GLOVE SUPERSENSE BIOGEL SZ 8 (GLOVE) ×1
GLOVE SURG SS PI 7.0 STRL IVOR (GLOVE) ×2 IMPLANT
GLOVE SURG SS PI 8.0 STRL IVOR (GLOVE) ×2 IMPLANT
GOWN STRL REUS W/ TWL LRG LVL3 (GOWN DISPOSABLE) ×2 IMPLANT
GOWN STRL REUS W/ TWL XL LVL3 (GOWN DISPOSABLE) ×3 IMPLANT
GOWN STRL REUS W/TWL LRG LVL3 (GOWN DISPOSABLE) ×4
GOWN STRL REUS W/TWL XL LVL3 (GOWN DISPOSABLE) ×6
K-WIRE DBL TROCAR .045X4 (WIRE) ×4
KIT BASIN OR (CUSTOM PROCEDURE TRAY) ×2 IMPLANT
KIT TURNOVER KIT B (KITS) ×2 IMPLANT
KWIRE DBL TROCAR .045X4 (WIRE) ×2 IMPLANT
LOOP VESSEL MAXI BLUE (MISCELLANEOUS) IMPLANT
LOOP VESSEL MINI RED (MISCELLANEOUS) ×2 IMPLANT
MANIFOLD NEPTUNE II (INSTRUMENTS) ×2 IMPLANT
MARKER SKIN DUAL TIP RULER LAB (MISCELLANEOUS) ×2 IMPLANT
NEEDLE HYPO 25GX1X1/2 BEV (NEEDLE) IMPLANT
NS IRRIG 1000ML POUR BTL (IV SOLUTION) ×2 IMPLANT
PACK ORTHO EXTREMITY (CUSTOM PROCEDURE TRAY) ×2 IMPLANT
PAD ARMBOARD 7.5X6 YLW CONV (MISCELLANEOUS) ×6 IMPLANT
PAD CAST 3X4 CTTN HI CHSV (CAST SUPPLIES) ×1 IMPLANT
PAD CAST 4YDX4 CTTN HI CHSV (CAST SUPPLIES) ×3 IMPLANT
PADDING CAST COTTON 3X4 STRL (CAST SUPPLIES) ×2
PADDING CAST COTTON 4X4 STRL (CAST SUPPLIES) ×6
PIN GUIDE DRILL TIP 2.8X300 (DRILL) ×2 IMPLANT
PLATE DIST HUM LAT LT SM (Plate) ×2 IMPLANT
PLATE DIST HUM MEDIAL LT SM (Plate) ×2 IMPLANT
PUTTY DBM STAGRAFT PLUS 10CC (Putty) ×2 IMPLANT
SCREW CANN 90X16X6.5 NS (Screw) ×1 IMPLANT
SCREW CANNULATED 6.5X90MM (Screw) ×2 IMPLANT
SCREW CORT 3.5X26 (Screw) ×2 IMPLANT
SCREW CORT 3.5X32 (Screw) ×2 IMPLANT
SCREW CORT T15 26X3.5XST LCK (Screw) ×1 IMPLANT
SCREW CORT T15 32X3.5XST LCK (Screw) ×1 IMPLANT
SCREW LOCK 3.5X20 DIST TIB (Screw) ×2 IMPLANT
SCREW LOCK 3.5X48 DIST TIB (Screw) ×2 IMPLANT
SCREW LOCK CORT STAR 3.5X20 (Screw) ×6 IMPLANT
SCREW LOCK CORT STAR 3.5X38 (Screw) ×2 IMPLANT
SCREW LOCK CORT STAR 3.5X40 (Screw) ×2 IMPLANT
SCREW LOCK CORT STAR 3.5X52 (Screw) ×2 IMPLANT
SET CYSTO W/LG BORE CLAMP LF (SET/KITS/TRAYS/PACK) ×2 IMPLANT
SLING ARM IMMOBILIZER XL (CAST SUPPLIES) ×2 IMPLANT
SOL PREP POV-IOD 4OZ 10% (MISCELLANEOUS) ×6 IMPLANT
SPECIMEN JAR SMALL (MISCELLANEOUS) ×2 IMPLANT
SPLINT FIBERGLASS 4X30 (CAST SUPPLIES) ×2 IMPLANT
SPONGE LAP 18X18 RF (DISPOSABLE) ×4 IMPLANT
SUT PROLENE 3 0 PS 2 (SUTURE) ×12 IMPLANT
SUT VIC AB 3-0 FS2 27 (SUTURE) IMPLANT
SUT VIC AB 3-0 SH 27 (SUTURE) ×6
SUT VIC AB 3-0 SH 27X BRD (SUTURE) ×3 IMPLANT
SYR CONTROL 10ML LL (SYRINGE) IMPLANT
TOWEL GREEN STERILE (TOWEL DISPOSABLE) ×4 IMPLANT
TOWEL GREEN STERILE FF (TOWEL DISPOSABLE) ×2 IMPLANT
TUBE CONNECTING 12X1/4 (SUCTIONS) ×2 IMPLANT
UNDERPAD 30X36 HEAVY ABSORB (UNDERPADS AND DIAPERS) ×2 IMPLANT
WASHER 3.5MM (Orthopedic Implant) ×4 IMPLANT
WASHER FLAT 6.5MM (Washer) ×2 IMPLANT
WATER STERILE IRR 1000ML POUR (IV SOLUTION) ×2 IMPLANT

## 2020-04-01 NOTE — Anesthesia Procedure Notes (Signed)
Anesthesia Regional Block: Supraclavicular block   Pre-Anesthetic Checklist: ,, timeout performed, Correct Patient, Correct Site, Correct Laterality, Correct Procedure, Correct Position, site marked, Risks and benefits discussed,  Surgical consent,  Pre-op evaluation,  At surgeon's request and post-op pain management  Laterality: Left  Prep: chloraprep       Needles:  Injection technique: Single-shot  Needle Type: Echogenic Needle     Needle Length: 5cm  Needle Gauge: 21     Additional Needles:   Narrative:  Start time: 04/01/2020 2:10 PM End time: 04/01/2020 2:13 PM Injection made incrementally with aspirations every 5 mL.  Performed by: Personally  Anesthesiologist: Beryle Lathe, MD  Additional Notes: No pain on injection. No increased resistance to injection. Injection made in 5cc increments. Good needle visualization. Patient tolerated the procedure well.

## 2020-04-01 NOTE — Op Note (Signed)
Operative note 04/01/2020  Dominica Severin MD  Preoperative diagnosis: Comminuted complex left elbow fracture with marked cartilage injury and comminution as well as metaphyseal bone loss.  Significant swelling left upper extremity status post fracture  Postop diagnosis: Same  Operative procedure #1 open reduction internal fixation with Biomet titanium plate and screw construct left elbow comminuted complex intra-articular low T condylar fracture utilizing allograft bone graft.  We utilized parallel plating for the fracture process.  #2 ulnar nerve release and anterior transposition left elbow #3 extensive bursectomy left elbow secondary to chronic bursitis #4 olecranon osteotomy and repair left elbow #5 stress radiography performed examined and interpreted by myself  Surgeon Dominica Severin  Anesthesia block with heavy IV sedation  Estimated blood loss less than 100 cc  Drains 1  Operation detail patient was seen by myself and anesthesia he was given a block in the preop holding area and counseled.  He was prepped and draped following this in the operative theater.  We took him to the operative theater placed SCDs about his legs turned him in a sloppy lateral position and made sure he was comfortable.  With sedation given the patient had the arm prepped and draped with 2 Hibiclens scrubs followed by Betadine scrub and paint which I performed by myself.  Sterile field was cured sterile tourniquet was placed and timeout was observed.  Preoperative antibiotics were given in the form of Ancef and all looked well.  Body parts were very carefully padded.  Following this the patient and carefully and cautiously underwent a utilitarian posterior incision.  Immediate bursectomy was necessary as he had a large inflamed bursa.  This was a large bursa circumferentially dissected and removed from the posterior olecranon region.  Following bursectomy the patient then underwent a very careful and cautious  approach to the extremity with ulnar nerve decompression and anterior transposition after the bursectomy was performed I very carefully identified the fascial adipose plane and then released the ulnar nerve at the arcade of Struthers, medial intermuscular septum which was excised, cubital tunnel, Osborne's ligament and the 2 heads of the FCU both superficial and deep.  Following this the ulnar nerve was gently transposed on a vessel loop and tucked for safekeeping.  Once this was done I then turned attention towards the olecranon tip.  A guidepin was placed and localization of the guidepin and a proposed olecranon osteotomy site was made.  I used an oscillating saw on the near and then a far break of the cortex about the olecranon with a elevator type device.  Following this the olecranon was swung back very carefully and expose the fracture site.  The fracture was very significantly injured.  Large amounts of cartilage loss and combination were noted.  I very carefully piece the cartilage out of poor position and then reassembled the fragments with K wires.  Following this we then placed a graft bone graft in the central area and reattached the cartilage as best as possible.  The patient tolerated this well.  He did have some pre-existing arthritis which was notable as well.  Unfortunately with the cartilage injury comminution and fragmentation distally as well as previous pre-existing arthritis he will have a high propensity towards traumatic arthritis.  Nevertheless we reassembled everything very nicely and was able to recreate the normal distal humerus contours on AP lateral oblique radiograph.  Following provisional fixation with Kirschner wire then placed a medial lateral plate.  I was able achieve excellent purchase.  Homerun screws distally  was accomplished without difficulty.  AP lateral and oblique x-rays and live fluoroscopy was accomplished to verify correct position.  I was quite pleased with  this.  Once the parallel plating was performed with standard AO technique I then tested the stability once more and then performed placement of a cable and a 90 mm with washer cannulated screw to repair the olecranon osteotomy after irrigated with 2 L of fluid.  Following repair with tensioning utilizing cable and a 90 mm cannulated screw we then irrigated an additional liter.  Following this tourniquet was deflated at less than 2 hours and the patient had the nerve secured with Vesseloops removed anteriorly.  I sutured the fascia about the olecranon osteotomy intact down the adipose tissue so to prevent subluxation of the nerve.  I tested this multiple times and look very good I was very pleased with this.  Following this we then irrigated and closed the fascia over the cable and took final x-rays.  The skin edge was closed over a Hemovac drain with Prolene suture.  The patient had good refill excellent pulse and a pink warm hand.  He has massive swelling given the timeframe duration from injury to presentation from my evaluation and surgical repair.  Will work very hard on controlling this.  I discussed all issues with patient and his family.  It has been a pleasure to participate in his care plan and we look forward to participating in his postop recovery. Postoperatively we will take his stitches out a 16 to 18 days and then begin well arm assisted range of motion in a long-arm splint making sure there is significant padding.  Between weeks 2 through 8 we want to simply get his arm up to 90 degrees and our goal is 20 degrees extension 90 degrees of flexion with full pronation supination the first 8 weeks.  Following this we will go for more aggressive flexion in the office predicated on x-rays looking stable.  All questions have been addressed. Anberlin Diez MD

## 2020-04-01 NOTE — Anesthesia Postprocedure Evaluation (Signed)
Anesthesia Post Note  Patient: Niquan Charnley.  Procedure(s) Performed: OPEN REDUCTION INTERNAL FIXATION (ORIF) SUPRACONDYLAR ELBOW  FRACTURE, OLECRANON OSTEOTOMY AND BURSECTOMY (Left Elbow) ULNAR NERVE RELEASE AND TRANSPOSITION (Left Elbow)     Patient location during evaluation: PACU Anesthesia Type: Regional and MAC Level of consciousness: awake and alert Pain management: pain level controlled Vital Signs Assessment: post-procedure vital signs reviewed and stable Respiratory status: spontaneous breathing, nonlabored ventilation, respiratory function stable and patient connected to nasal cannula oxygen Cardiovascular status: stable and blood pressure returned to baseline Postop Assessment: no apparent nausea or vomiting Anesthetic complications: no   No complications documented.  Last Vitals:  Vitals:   04/01/20 1855 04/01/20 1910  BP: (!) 137/99 (!) 148/92  Pulse: (!) 58 (!) 58  Resp: 16 19  Temp:    SpO2: 100% 98%    Last Pain:  Vitals:   04/01/20 1855  TempSrc:   PainSc: 0-No pain                 Kmari Halter S

## 2020-04-01 NOTE — Anesthesia Procedure Notes (Signed)
Procedure Name: MAC Date/Time: 04/01/2020 3:10 PM Performed by: Moshe Salisbury, CRNA Pre-anesthesia Checklist: Patient identified, Emergency Drugs available, Suction available and Patient being monitored Patient Re-evaluated:Patient Re-evaluated prior to induction Oxygen Delivery Method: Nasal cannula Placement Confirmation: positive ETCO2 Dental Injury: Teeth and Oropharynx as per pre-operative assessment

## 2020-04-01 NOTE — H&P (Signed)
Brent Griffin. is an 74 y.o. male.   Chief Complaint: Comminuted complex left elbow fracture HPI: Patient presents with a left elbow comminuted complex fracture with displacement marked swelling and intact neurovascular exam.  Were planning ORIF.  This will be a left elbow open reduction internal fixation and repair with ulnar nerve release and transposition and olecranon osteotomy.  I discussed him all risk and benefits.  Patient presents for evaluation and treatment of the of their upper extremity predicament. The patient denies neck, back, chest or  abdominal pain. The patient notes that they have no lower extremity problems. The patients primary complaint is noted. We are planning surgical care pathway for the upper extremity.  Past Medical History:  Diagnosis Date  . Arthritis    thumb right hand  . 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 Diabetes mellitus    type II, not taking medications - 03/31/20  . History of kidney stones    x 3  . Hyperlipidemia   . Hypertension   . Metatarsalgia    left foot  . 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 COLONOSCOPY    . FOOT FRACTURE SURGERY Bilateral   . FOOT SURGERY Left 12/2018  . LITHOTRIPSY    . MOUTH SURGERY  1990  . ROTATOR CUFF REPAIR Right 2018  . VASECTOMY  1984    Family History  Problem Relation Age of Onset  . Colon cancer Other 37   Social History:  reports that he has been smoking cigarettes. He has smoked for the past 30.00 years. He has never used smokeless tobacco. He reports that he does not drink alcohol and does not use drugs.  Allergies: No Known Allergies  Medications Prior to Admission  Medication Sig Dispense Refill  . acetaminophen (TYLENOL) 500 MG tablet Take 1,000 mg by mouth every 6 (six) hours as needed for moderate pain or headache.    Marland Kitchen amLODipine (NORVASC) 10 MG tablet Take 10 mg by mouth.    Marland Kitchen aspirin EC 81 MG  tablet Take 81 mg by mouth daily. Swallow whole.    . Cinnamon 500 MG capsule Take 500 mg by mouth daily.    Marland Kitchen doxycycline (VIBRA-TABS) 100 MG tablet Take 100 mg by mouth daily.    . folic acid (FOLVITE) 1 MG tablet Take 1 mg by mouth daily.    . hydrochlorothiazide (HYDRODIURIL) 25 MG tablet Take 25 mg by mouth daily.    Marland Kitchen losartan (COZAAR) 100 MG tablet Take 100 mg by mouth daily.    . Melatonin 5 MG CAPS Take 10-15 mg by mouth at bedtime as needed (sleep).    . metoprolol succinate (TOPROL-XL) 100 MG 24 hr tablet Take 150 mg by mouth at bedtime.    . Multiple Vitamin (MULTI-VITAMINS) TABS Take 1 tablet by mouth daily.    . naproxen (NAPROSYN) 500 MG tablet Take 500 mg by mouth 2 (two) times daily as needed for moderate pain.    . Omega-3 Fatty Acids (OMEGA 3 PO) Take 2 capsules by mouth daily.    Marland Kitchen oxyCODONE-acetaminophen (PERCOCET/ROXICET) 5-325 MG tablet Take 1 tablet by mouth every 4 (four) hours as needed for severe pain. 15 tablet 0  . potassium chloride SA (KLOR-CON) 20 MEQ tablet Take 20 mEq by mouth 2 (two) times daily.    . rosuvastatin (CRESTOR) 10 MG tablet Take 10 mg by mouth at bedtime.    Marland Kitchen  sodium chloride (OCEAN) 0.65 % SOLN nasal spray Place 1 spray into both nostrils as needed for congestion.    Marland Kitchen tiZANidine (ZANAFLEX) 4 MG tablet Take 4 mg by mouth 2 (two) times daily as needed for muscle spasms.    . TURMERIC PO Take 1 tablet by mouth daily.    . vitamin B-12 (CYANOCOBALAMIN) 1000 MCG tablet Take 1,000 mcg by mouth daily.    . Menthol-Methyl Salicylate (SALONPAS PAIN RELIEF PATCH EX) Apply 1-2 patches topically daily as needed (pain).    Glory Rosebush DELICA LANCETS 99991111 MISC USE ONE LANCET DAILY TO TEST BLOOD GLUCOSE  3  . ONETOUCH VERIO test strip USE ONE STRIP DAILY TO TEST BLOOD GLUCOSE.  3    Results for orders placed or performed during the hospital encounter of 04/01/20 (from the past 48 hour(s))  Glucose, capillary     Status: None   Collection Time: 04/01/20 12:07  PM  Result Value Ref Range   Glucose-Capillary 92 70 - 99 mg/dL    Comment: Glucose reference range applies only to samples taken after fasting for at least 8 hours.  Basic metabolic panel per protocol     Status: Abnormal   Collection Time: 04/01/20 12:35 PM  Result Value Ref Range   Sodium 141 135 - 145 mmol/L   Potassium 3.3 (L) 3.5 - 5.1 mmol/L   Chloride 106 98 - 111 mmol/L   CO2 25 22 - 32 mmol/L   Glucose, Bld 93 70 - 99 mg/dL    Comment: Glucose reference range applies only to samples taken after fasting for at least 8 hours.   BUN 12 8 - 23 mg/dL   Creatinine, Ser 0.89 0.61 - 1.24 mg/dL   Calcium 8.7 (L) 8.9 - 10.3 mg/dL   GFR, Estimated >60 >60 mL/min    Comment: (NOTE) Calculated using the CKD-EPI Creatinine Equation (2021)    Anion gap 10 5 - 15    Comment: Performed at [facility] [address], [place], [place] [address]  CBC per protocol     Status: None   Collection Time: 04/01/20 12:35 PM  Result Value Ref Range   WBC 5.0 4.0 - 10.5 K/uL   RBC 4.43 4.22 - 5.81 MIL/uL   Hemoglobin 13.5 13.0 - 17.0 g/dL   HCT 41.3 39.0 - 52.0 %   MCV 93.2 80.0 - 100.0 fL   MCH 30.5 26.0 - 34.0 pg   MCHC 32.7 30.0 - 36.0 g/dL   RDW 14.5 11.5 - 15.5 %   Platelets 161 150 - 400 K/uL   nRBC 0.0 0.0 - 0.2 %    Comment: Performed at [facility], [place] [address], [place], [place] [address]  Glucose, capillary     Status: None   Collection Time: 04/01/20  2:10 PM  Result Value Ref Range   Glucose-Capillary 93 70 - 99 mg/dL    Comment: Glucose reference range applies only to samples taken after fasting for at least 8 hours.   No results found.  Review of Systems  Respiratory: Negative.   Cardiovascular: Negative.   Gastrointestinal: Negative.   Endocrine: Negative.   Genitourinary: Negative.     Blood pressure (!) 169/86, pulse 73, temperature 98.5 F (36.9 C), temperature source Oral, resp. rate 12, height 5\' 10"  (1.778 m), weight 87.1 kg, SpO2 97  %. Physical Exam left elbow comminuted complex fracture with swelling ecchymosis and no evidence of compartment syndrome.  I reviewed his preoperative CT scan  and x-rays and will plan for surgical reconstruction as outlined extensively in my office.  I discussed him risk-benefit profiles and other issues germane to the upper extremity predicament.  With this in mind we will proceed accordingly.  The patient is alert and oriented in no acute distress. The patient complains of pain in the affected upper extremity.  The patient is noted to have a normal HEENT exam. Lung fields show equal chest expansion and no shortness of breath. Abdomen exam is nontender without distention. Lower extremity examination does not show any fracture dislocation or blood clot symptoms. Pelvis is stable and the neck and back are stable and nontender.  Assessment/Plan We will plan for open reduction internal fixation olecranon osteotomy and repair reconstruction with ulnar nerve decompression and transposition left elbow.  We are planning surgery for your upper extremity. The risk and benefits of surgery to include risk of bleeding, infection, anesthesia,  damage to normal structures and failure of the surgery to accomplish its intended goals of relieving symptoms and restoring function have been discussed in detail. With this in mind we plan to proceed. I have specifically discussed with the patient the pre-and postoperative regime and the dos and don'ts and risk and benefits in great detail. Risk and benefits of surgery also include risk of dystrophy(CRPS), chronic nerve pain, failure of the healing process to go onto completion and other inherent risks of surgery The relavent the pathophysiology of the disease/injury process, as well as the alternatives for treatment and postoperative course of action has been discussed in great detail with the patient who desires to proceed.  We will do everything in our power to help  you (the patient) restore function to the upper extremity. It is a pleasure to see this patient today.   Oletta Cohn III, MD 04/01/2020, 2:37 PM

## 2020-04-01 NOTE — Anesthesia Preprocedure Evaluation (Addendum)
Anesthesia Evaluation  Patient identified by MRN, date of birth, ID band Patient awake    Reviewed: Allergy & Precautions, NPO status , Patient's Chart, lab work & pertinent test results  History of Anesthesia Complications Negative for: history of anesthetic complications  Airway Mallampati: III  TM Distance: >3 FB Neck ROM: Full    Dental  (+) Dental Advisory Given, Teeth Intact   Pulmonary Current Smoker and Patient abstained from smoking.,    Pulmonary exam normal        Cardiovascular hypertension, Pt. on medications and Pt. on home beta blockers Normal cardiovascular exam     Neuro/Psych negative neurological ROS  negative psych ROS   GI/Hepatic negative GI ROS, (+)     substance abuse (previous)  alcohol use,   Endo/Other  diabetes, Type 2, Oral Hypoglycemic Agents  Renal/GU negative Renal ROS     Musculoskeletal  (+) Arthritis ,   Abdominal   Peds  Hematology negative hematology ROS (+)   Anesthesia Other Findings Covid test negative   Reproductive/Obstetrics                            Anesthesia Physical Anesthesia Plan  ASA: III  Anesthesia Plan: Regional   Post-op Pain Management:  Regional for Post-op pain   Induction: Intravenous  PONV Risk Score and Plan: 1 and Propofol infusion and Treatment may vary due to age or medical condition  Airway Management Planned: Natural Airway and Simple Face Mask  Additional Equipment: None  Intra-op Plan:   Post-operative Plan:   Informed Consent: I have reviewed the patients History and Physical, chart, labs and discussed the procedure including the risks, benefits and alternatives for the proposed anesthesia with the patient or authorized representative who has indicated his/her understanding and acceptance.       Plan Discussed with: CRNA, Anesthesiologist and Surgeon  Anesthesia Plan Comments: (Discussed nerve  block with surgeon prior to surgery, surgeon ok with preoperative nerve block.)     Anesthesia Quick Evaluation

## 2020-04-01 NOTE — Transfer of Care (Signed)
Immediate Anesthesia Transfer of Care Note  Patient: Brent Griffin.  Procedure(s) Performed: OPEN REDUCTION INTERNAL FIXATION (ORIF) SUPRACONDYLAR ELBOW  FRACTURE, OLECRANON OSTEOTOMY AND BURSECTOMY (Left Elbow) ULNAR NERVE RELEASE AND TRANSPOSITION (Left Elbow)  Patient Location: PACU  Anesthesia Type:MAC and Regional  Level of Consciousness: awake, alert  and oriented  Airway & Oxygen Therapy: Patient Spontanous Breathing and Patient connected to nasal cannula oxygen  Post-op Assessment: Report given to RN and Post -op Vital signs reviewed and stable  Post vital signs: Reviewed and stable  Last Vitals:  Vitals Value Taken Time  BP 135/84 04/01/20 1825  Temp    Pulse 58 04/01/20 1827  Resp 17 04/01/20 1827  SpO2 98 % 04/01/20 1827  Vitals shown include unvalidated device data.  Last Pain:  Vitals:   04/01/20 1410  TempSrc:   PainSc: 0-No pain      Patients Stated Pain Goal: 2 (70/11/00 3496)  Complications: No complications documented.

## 2020-04-02 ENCOUNTER — Encounter (HOSPITAL_COMMUNITY): Payer: Self-pay | Admitting: Orthopedic Surgery

## 2020-04-02 MED ORDER — INFLUENZA VAC A&B SA ADJ QUAD 0.5 ML IM PRSY
0.5000 mL | PREFILLED_SYRINGE | INTRAMUSCULAR | Status: AC
  Administered 2020-04-05: 0.5 mL via INTRAMUSCULAR
  Filled 2020-04-02: qty 0.5

## 2020-04-02 MED ORDER — MUPIROCIN 2 % EX OINT
1.0000 "application " | TOPICAL_OINTMENT | Freq: Two times a day (BID) | CUTANEOUS | Status: AC
  Administered 2020-04-02 – 2020-04-06 (×10): 1 via NASAL
  Filled 2020-04-02 (×2): qty 22

## 2020-04-02 MED ORDER — CHLORHEXIDINE GLUCONATE CLOTH 2 % EX PADS
6.0000 | MEDICATED_PAD | Freq: Every day | CUTANEOUS | Status: AC
  Administered 2020-04-02 – 2020-04-06 (×4): 6 via TOPICAL

## 2020-04-02 NOTE — Progress Notes (Signed)
Patient ID: Brent Griffin., male   DOB: [birth year], 74 y.o.   MRN: [id number] Patient is seen today at bedside.  Patient is alert.  Patient is appropriate to the time and place.  He still feels as though he is shaking off the anesthesia and the nursing staff reports he was a little bit confused after anesthesia last night.  We will continue to watch this.  He has a warm hand.  I removed his drain at bedside.  Reviewed the sling and once again discussed with him the importance of elevation massage and edema control to the fingers.  His block is still intact and as this wears off he understands it will be very important to elevate move and massage fingers.  We can have him see therapy today in terms of physical and occupational therapy.  If he is stable I would consider discharge later today.  No signs of DVT infection or complication at present time.  Lower extremity examination is stable.  He has equal chest expansion no shortness of breath and a regular rate in terms of his heart rate.  Abdomen is nontender.  Status post reconstructive efforts and intervention left comminuted complex elbow fracture with olecranon osteotomy, ulnar nerve release and transposition, and open reduction internal fixation left elbow fracture April 01, 2020.  All questions have been addressed.  I discussed with nursing staff I will call him later today if he is stable will transition to home which is his preference.  Tavonna Worthington MD

## 2020-04-02 NOTE — Evaluation (Signed)
Physical Therapy Evaluation Patient Details Name: Brent Griffin. 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 Comes from home where he lives with his wife in a mutli-lelvel home with a few steps to enter; Presents to PT with mildly incr fall risk, mild gait and balance dysfunction;  Patient will benefit from skilled PT to increase their independence and safety with mobility to allow discharge to the venue listed below.       Follow Up Recommendations Home health PT    Equipment Recommendations  None recommended by PT (pretty well-equipped; agree with shower seat)    Recommendations for Other Services       Precautions / Restrictions Precautions Precautions: Fall Required Braces or Orthoses:  (Declined sling; Tells me it'as uncomfortable) Restrictions Other Position/Activity Restrictions: No formal orders but assumed NWB L LE      Mobility  Bed Mobility Overal bed mobility: Needs Assistance Bed Mobility: Supine to Sit     Supine to sit: Min assist     General bed mobility comments: Min A for scooting hips. Heavy use of bed rail    Transfers Overall transfer level: Needs assistance Equipment used: Straight cane Transfers: Sit to/from Stand Sit to Stand: Min guard         General transfer comment: min guard for safety, unsteadiness noted with mobility and no support, improved with handheld assist and increased activity.  Ambulation/Gait Ambulation/Gait assistance: Min  guard (with and without physical contact) Gait Distance (Feet): 60 Feet Assistive device: Straight cane Gait Pattern/deviations: Step-through pattern     General Gait Details: Mildlly uncoordinated steps; erratic step width; tending to keep cane a bit ahead of him rather than beside him, and did not correct with cues  Stairs            Wheelchair Mobility    Modified Rankin (Stroke Patients Only)       Balance     Sitting balance-Leahy Scale: Fair       Standing balance-Leahy Scale: Fair                               Pertinent Vitals/Pain Pain Assessment: No/denies pain    Home Living Family/patient expects to be discharged to:: Private residence Living Arrangements: Spouse/significant other Available Help at Discharge: Family Type of Home: House Home Access: Stairs to enter Entrance Stairs-Rails:  (on front door, but not back door`) Entrance Stairs-Number of Steps: 5-6 Home Layout: Multi-level;Able to live on main level with bedroom/bathroom Home Equipment: Gil[employer] Mor - single point;Toilet riser Additional Comments: Wife works full time from home. Pt reports his family next door has a shower chair that he can borrow    Prior Function Level of Independence: Independent         Comments: Independent with all ADLs, IADLs and mobility. Reports he has canes but does not like to use them. Pt enjoys taking care of his dog.     Hand Dominance        Extremity/Trunk Assessment   Upper  Extremity Assessment Upper Extremity Assessment: Defer to OT evaluation    Lower Extremity Assessment Lower Extremity Assessment:  (Notable mild dyscoordination with steps, erratic step width, no overt scissoring)       Communication   Communication: No difficulties  Cognition Arousal/Alertness: Awake/alert Behavior During Therapy: WFL for tasks assessed/performed Overall Cognitive Status: Impaired/Different from baseline Area of Impairment:  Safety/judgement;Following commands                       Following Commands: Follows multi-step commands with increased time Safety/Judgement: Decreased awareness of safety;Decreased awareness of deficits     General Comments: Agree with OT notes on cognition; slightly "off"      General Comments      Exercises     Assessment/Plan    PT Assessment Patient needs continued PT services  PT Problem List Decreased activity tolerance;Decreased balance;Decreased coordination;Decreased cognition;Decreased knowledge of precautions       PT Treatment Interventions DME instruction;Gait training;Stair training;Functional mobility training;Therapeutic activities;Therapeutic exercise;Balance training;Cognitive remediation;Patient/family education    PT Goals (Current goals can be found in the Care Plan section)  Acute Rehab PT Goals Patient Stated Goal: be able to go home, return to normal activities PT Goal Formulation: With patient Time For Goal Achievement: 04/16/20 Potential to Achieve Goals: Good    Frequency Min 4X/week   Barriers to discharge        Co-evaluation               AM-PAC PT "6 Clicks" Mobility  Outcome Measure Help needed turning from your back to your side while in a flat bed without using bedrails?: A Little Help needed moving from lying on your back to sitting on the side of a flat bed without using bedrails?: A Little Help needed moving to and from a bed to a chair (including a wheelchair)?: A Little Help needed standing up from a chair using your arms (e.g., wheelchair or bedside chair)?: A Little Help needed to walk in hospital room?: A Little Help needed climbing 3-5 steps with a railing? : A Little 6 Click Score: 18    End of Session Equipment Utilized During Treatment: Gait belt Activity Tolerance: Patient tolerated treatment well Patient left: in bed;with call bell/phone within reach;with bed alarm set   PT Visit Diagnosis: Other  abnormalities of gait and mobility (R26.89)    Time: [id number] PT Time Calculation (min) (ACUTE ONLY): 16 min   Charges:   PT Evaluation $PT Eval Moderate Complexity: 1 Mod          Roney Marion, [place] Pager [phone] Office [place] 04/02/2020, 1:51 PM

## 2020-04-02 NOTE — Plan of Care (Signed)
Patient is alert, oriented x4. Denies any pain d/t regional nerve block to left arm.  Problem: Activity: Goal: Ability to avoid complications of mobility impairment will improve Outcome: Progressing Goal: Ability to tolerate increased activity will improve Outcome: Progressing   Problem: Education: Goal: Verbalization of understanding the information provided will improve Outcome: Progressing   Problem: Coping: Goal: Level of anxiety will decrease Outcome: Progressing   Problem: Physical Regulation: Goal: Postoperative complications will be avoided or minimized Outcome: Progressing   Problem: Respiratory: Goal: Ability to maintain a clear airway will improve Outcome: Progressing   Problem: Pain Management: Goal: Pain level will decrease Outcome: Progressing   Problem: Skin Integrity: Goal: Signs of wound healing will improve Outcome: Progressing   Problem: Tissue Perfusion: Goal: Ability to maintain adequate tissue perfusion will improve Outcome: Progressing   Problem: Education: Goal: Knowledge of General Education information will improve Description: Including pain rating scale, medication(s)/side effects and non-pharmacologic comfort measures Outcome: Progressing   Problem: Health Behavior/Discharge Planning: Goal: Ability to manage health-related needs will improve Outcome: Progressing   Problem: Clinical Measurements: Goal: Ability to maintain clinical measurements within normal limits will improve Outcome: Progressing Goal: Will remain free from infection Outcome: Progressing Goal: Diagnostic test results will improve Outcome: Progressing Goal: Respiratory complications will improve Outcome: Progressing Goal: Cardiovascular complication will be avoided Outcome: Progressing   Problem: Activity: Goal: Risk for activity intolerance will decrease Outcome: Progressing   Problem: Nutrition: Goal: Adequate nutrition will be maintained Outcome:  Progressing   Problem: Coping: Goal: Level of anxiety will decrease Outcome: Progressing   Problem: Elimination: Goal: Will not experience complications related to bowel motility Outcome: Progressing Goal: Will not experience complications related to urinary retention Outcome: Progressing   Problem: Pain Managment: Goal: General experience of comfort will improve Outcome: Progressing   Problem: Safety: Goal: Ability to remain free from injury will improve Outcome: Progressing   Problem: Skin Integrity: Goal: Risk for impaired skin integrity will decrease Outcome: Progressing

## 2020-04-02 NOTE — Progress Notes (Signed)
Patient ID: Brent Heal., male   DOB: June 24, 1945, 74 y.o.   MRN: [id number] Spoke with the patient's wife and patient's nurse in regards to the patient and his pain level etc.  Patient is doing reasonably well but given his pain issues and the late surgery yesterday we will plan for observation overnight.  I will draw labs in the morning and make sure his electrolytes look well.  His block is worn off and he certainly has significant pain to warrant continued observation  We will use pain medicine cautiously as he is still a little cloudy from the anesthesia but does act appropriate according to his wife who is been with him.  Unfortunately this can be a very painful elbow thus if he has significant pain I do feel that we need to continue the p.o. oxycodone and I explained this to his wife   Lekeshia Kram MD

## 2020-04-02 NOTE — Evaluation (Signed)
Occupational Therapy Evaluation/Discharge Patient Details Name: Brent Griffin. 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 Pt presents now with nerve block still in effect, able to wiggle digits but unable to oppose digits. Pt edematous - educated on elevation, ice and ROM to combat swelling. Educated pt on precautions with cues intermittently needed to avoid WB as pt with decreased proprioception of L UE. Pt overall Min A for bed mobility, min guard for sit to stand and mobility in room without AD. Pt unsteady without UE support and improved with handheld assist. On exit of room, pt with noted confusion (see cognition section, after-effect of anesthesia?) that resolved with redirection of tasks. Pt requires Min A for UB/LB ADLs due to deficits. Pt's wife works from home, so recommend 24/7 supervision initially, use of shower chair for safety and use of cane for stability at home. Pt would benefit from [facility] follow-up to assess home setup and safety with ADLs/IADLs. Then progression to OP OT as ROM/WB restrictions change. All further needs can be met in next venue. OT to sign off.     Follow Up Recommendations  Home health OT;Supervision/Assistance - 24 hour    Equipment Recommendations  Other (comment) (plans to borrow shower chair from family next door)    Recommendations for Other Services       Precautions / Restrictions Precautions Precautions: Fall Required Braces or  Orthoses: Sling Restrictions Weight Bearing Restrictions: Yes Other Position/Activity Restrictions: No formal orders but assumed NWB L LE      Mobility Bed Mobility Overal bed mobility: Needs Assistance Bed Mobility: Supine to Sit     Supine to sit: Min assist     General bed mobility comments: Min A for scooting hips. Heavy use of bed rail    Transfers Overall transfer level: Needs assistance Equipment used: None;1 person hand held assist Transfers: Sit to/from Stand Sit to Stand: Min guard         General transfer comment: min guard for safety, unsteadiness noted with mobility and no support, improved with handheld assist and increased activity.    Balance Overall balance assessment: Needs assistance Sitting-balance support: No upper extremity supported;Feet supported Sitting balance-Leahy Scale: Good     Standing balance support: No upper extremity supported;Single extremity supported;During functional activity Standing balance-Leahy Scale: Good Standing balance comment: able to mobilize without AD though unsteady, improved with one UE support                           ADL either performed or assessed with clinical judgement   ADL Overall ADL's : Needs assistance/impaired Eating/Feeding: Set up;Sitting Eating/Feeding Details (indicate cue type and reason): Assistance to open drink cup, able to obtain utensils and open pepper packet using L hand as helper Grooming: Supervision/safety;Standing   Upper Body Bathing: Minimal assistance;Sitting   Lower Body Bathing: Supervison/ safety;Sitting/lateral leans;Sit to/from stand   Upper Body Dressing : Minimal assistance;Sitting Upper Body Dressing Details (indicate cue type and reason): Reports dressing self with pullover  shirts at initial injury with larger bandage. Reports donning affected UE last, but encouraged pt to don affected UE first to increase efficiency Lower Body Dressing: Minimal assistance;Sit  to/from stand;Sitting/lateral leans Lower Body Dressing Details (indicate cue type and reason): Min A for donning socks with one handed technique. Pt bending to feet but encouraged using figure four position if possible to decrease fall risk and avoid inadvertently placing weight in L UE (noted on bedside due to decreased sensation/proprioception) Toilet Transfer: Min guard;Ambulation;Regular Teacher, adult education Details (indicate cue type and reason): simulated in room, improved stability with handheld assist Toileting- Clothing Manipulation and Hygiene: Supervision/safety;Sitting/lateral lean;Sit to/from stand       Functional mobility during ADLs: Min guard General ADL Comments: Pt with limitations in dynamic standing balance, higher level problem solving     Vision Baseline Vision/History: Wears glasses Wears Glasses: Reading only Patient Visual Report: No change from baseline Vision Assessment?:  (to be further assessed) Additional Comments: to be further assessed. on exit of room, pt reporting seeing mask hanging on wall, but was wire rack that holds saniwipes. vision vs cognition? Pt reports wearing glassess only for seeing     Perception     Praxis      Pertinent Vitals/Pain Pain Assessment: No/denies pain (nerve block)     Hand Dominance  (ambidextrous)   Extremity/Trunk Assessment Upper Extremity Assessment Upper Extremity Assessment: LUE deficits/detail LUE Deficits / Details: Edematous L hand. able to wiggle digits, make light fist, difficulty with opposition. Nerve block in effect, mostly numb along outer aspect of hand, 5th digit. AAROM of shoulder performed by pt WFL. bandaged from mid hand to proximal elbow LUE: Unable to fully assess due to immobilization LUE Sensation: decreased light touch;decreased proprioception LUE Coordination: decreased fine motor;decreased gross motor   Lower Extremity Assessment Lower Extremity Assessment: Defer to PT evaluation    Cervical / Trunk Assessment Cervical / Trunk Assessment: Normal   Communication Communication Communication: No difficulties   Cognition Arousal/Alertness: Awake/alert Behavior During Therapy: WFL for tasks assessed/performed Overall Cognitive Status: Impaired/Different from baseline Area of Impairment: Safety/judgement;Following commands                       Following Commands: Follows multi-step commands with increased time Safety/Judgement: Decreased awareness of safety;Decreased awareness of deficits     General Comments: Pt A&Ox4. though exhibited some instances of confusion (maybe from anesthesia?). Attempted to walk out of room after educated need for mask, looked in fall bag OT brought in for mask and reported seeing mask hanging outside of door (was wire rack for holding saniwipes). Otherwise, functional   General Comments  Educated on elevation (propped up UE sitting EOB while eating breakfast), digit motion, ice and sling wear. Encouraged pt to use shower chair and cane for stability. Recommended to have dog in another room and allow pt to sit down once at home to prevent injury if dog jumps on pt    Exercises     Shoulder Instructions      Home Living Family/patient expects to be discharged to:: Private residence Living Arrangements: Spouse/significant other Available Help at Discharge: Family Type of Home: House Home Access: Stairs to enter [employer] of Steps: 5-6 Entrance Stairs-Rails:  (on front door, but not back door`) Home Layout: Multi-level;Able to live on main level with bedroom/bathroom     Bathroom Shower/Tub: Walk-in shower;Tub/shower unit   Bathroom Toilet: Standard     Home Equipment: Cane - single point;Toilet riser  Additional Comments: Wife works full time from home. Pt reports his family next door has a shower chair that he can borrow      Prior Functioning/Environment Level of Independence: Independent         Comments: Independent with all ADLs, IADLs and mobility. Reports he has canes but does not like to use them. Pt enjoys taking care of his dog.        OT Problem List: Decreased strength;Decreased range of motion;Impaired balance (sitting and/or standing);Decreased knowledge of precautions;Impaired UE functional use      OT Treatment/Interventions:      OT Goals(Current goals can be found in the care plan section) Acute Rehab OT Goals Patient Stated Goal: be able to go home, return to normal activities OT Goal Formulation: All assessment and education complete, DC therapy  OT Frequency:     Barriers to D/C:            Co-evaluation              AM-PAC OT "6 Clicks" Daily Activity     Outcome Measure Help from another person eating meals?: A Little Help from another person taking care of personal grooming?: A Little Help from another person toileting, which includes using toliet, bedpan, or urinal?: A Little Help from another person bathing (including washing, rinsing, drying)?: A Little Help from another person to put on and taking off regular upper body clothing?: A Little Help from another person to put on and taking off regular lower body clothing?: A Little 6 Click Score: 18   End of Session Equipment Utilized During Treatment: Gait belt;Other (comment) (sling) Nurse Communication: Mobility status;Other (comment) (cognition presentation)  Activity Tolerance: Patient tolerated treatment well Patient left: in bed;with call bell/phone within reach;with bed alarm set (sitting EOB eating breakfast with L UE propped up)  OT Visit Diagnosis: Other abnormalities of gait and mobility (R26.89);Unsteadiness on feet (R26.81);Muscle weakness (generalized) (M62.81)                Time: 9432-0037 OT Time Calculation (min): 31 min Charges:  OT General Charges $OT Visit: 1 Visit OT Evaluation $OT Eval Low Complexity: 1 Low OT Treatments $Self Care/Home Management : 8-22  mins  Layla Maw, OTR/L  Layla Maw 04/02/2020, 8:35 AM

## 2020-04-02 NOTE — Care Management Obs Status (Signed)
MEDICARE OBSERVATION STATUS NOTIFICATION   Patient Details  Name: Brent Griffin. MRN: [id number] Date of Birth: January 25, 1946   Medicare Observation Status Notification Given:  Yes    Janae Bridgeman, RN 04/02/2020, 2:43 PM

## 2020-04-02 NOTE — TOC Initial Note (Signed)
Transition of Care (TOC) - Initial/Assessment Note    Patient Details  Name: Brent Griffin. MRN: [id number] Date of Birth: 02-Aug-1945  Transition of Care [facility]) CM/SW Contact:    Curlene Labrum, RN Phone Number: 04/02/2020, 3:00 PM  Clinical Narrative:                 Case management met with the patient at the bedside regarding transitions of care to home.  The patient lives with his wife, Brent Griffin who is able to provide assistance at home.  The patient states that he might need a cane to aide in ambulation - I called [employer] and they will delivered a cane to the hospital room prior to patient's discharge to home.  I presented the patient with a Medicare observation form and he acknowledged understanding and left a copy of the document with the patient at the bedside.  I offered Medicare choice to the patient regarding home health services and he did not have a preference.  I called Tommi Rumps, RNCM with [facility] and the patient was set up with PT/Ot home health services.  The patient is able to discharge home once the patient is cleared by Dr. Amedeo Plenty today or tomorrow if medically stable.  Expected Discharge Plan: [place] Barriers to Discharge: Continued Medical Work up ([employer] to deliver cane to hospital room.)   Patient Goals and CMS Choice Patient states their goals for this hospitalization and ongoing recovery are:: Patient plans to discharge home with wife. CMS Medicare.gov Compare Post Acute Care list provided to:: Patient Choice offered to / list presented to : Patient  Expected Discharge Plan and Services Expected Discharge Plan: [place]   Discharge Planning Services: CM Consult Post Acute Care Choice: [place] Living arrangements for the past 2 months: Single Family Home                 DME Arranged: Kasandra Knudsen DME Agency: [employer] Date DME Agency Contacted: 04/02/20 Time DME Agency Contacted:  [phone] Representative spoke with at DME Agency: CM with [employer] [place]: PT,OT [place] Agency: [place] Date [place]: 04/02/20 Time [place]: [place] Representative spoke with at [place]: Tommi Rumps, [place] with [place]  Prior Living Arrangements/Services Living arrangements for the past 2 months: Single Family Home Lives with:: Spouse Patient language and need for interpreter reviewed:: Yes Do you feel safe going back to the place where you live?: Yes      Need for Family Participation in Patient Care: Yes (Comment) Care giver support system in place?: Yes (comment)   Criminal Activity/Legal Involvement Pertinent to Current Situation/Hospitalization: No - Comment as needed  Activities of Daily Living Home Assistive Devices/Equipment: CBG Meter,Blood pressure cuff,Other (Comment) (pulse ox) ADL Screening (condition at time of admission) Patient's cognitive ability adequate to safely complete daily activities?: Yes Is the patient deaf or have difficulty hearing?: No Does the patient have difficulty seeing, even when wearing glasses/contacts?: No Does the patient have difficulty concentrating, remembering, or making decisions?: No Patient able to express need for assistance with ADLs?: No Does the patient have difficulty dressing or bathing?: Yes Independently performs ADLs?: Yes (appropriate for developmental age) Does the patient have difficulty walking or climbing stairs?: No Weakness of Legs: None Weakness of Arms/Hands: None  Permission Sought/Granted Permission sought to share information with : Case Manager Permission granted to share information with : Yes, Verbal Permission Granted     Permission  granted to share info w AGENCY: [place] agency, [employer] for [place] granted to share info w Relationship: spouse - Dann Galicia     Emotional Assessment Appearance:: Appears stated age Attitude/Demeanor/Rapport: Gracious Affect (typically  observed): Accepting Orientation: : Oriented to Self,Oriented to Place,Oriented to  Time,Oriented to Situation Alcohol / Substance Use: Not Applicable Psych Involvement: No (comment)  Admission diagnosis:  Closed fracture of left distal humerus [S42.402A] Patient Active Problem List   Diagnosis Date Noted  . Closed fracture of left distal humerus 04/01/2020  . Hypokalemia 03/13/2018  . Adjustment insomnia 03/10/2018  . Renal insufficiency 03/10/2018  . Tobacco use disorder 03/10/2018  . Neck pain 12/01/2017  . Erectile dysfunction following radiation therapy 09/14/2017  . Urinary hesitancy 09/08/2017  . Prostate cancer ([place]) 01/16/2016  . Hyperlipidemia LDL goal <100 08/27/2014  . Hyperlipidemia associated with type 2 diabetes mellitus ([place]) 08/27/2014  . Muscle spasm of back 02/13/2014  . Rosacea 02/13/2014  . Scoliosis 02/13/2014  . Essential hypertension 02/12/2014  . Elevated prostate specific antigen (PSA) 01/06/2014  . Muscle spasms of neck 12/17/2013  . Type II diabetes mellitus ([place]) 05/13/2013   PCP:  [name], PA-C Pharmacy:   [place], [place] [address] [place] [address] Phone: [phone] Fax: [phone]  [facility] [place], [address] [place] [address] Phone: [phone] Fax: [phone]     Social Determinants of Health (SDOH) Interventions    Readmission Risk Interventions No flowsheet data found.

## 2020-04-03 ENCOUNTER — Inpatient Hospital Stay (HOSPITAL_COMMUNITY): Payer: Medicare Other

## 2020-04-03 DIAGNOSIS — Z23 Encounter for immunization: Secondary | ICD-10-CM | POA: Diagnosis present

## 2020-04-03 DIAGNOSIS — Z79899 Other long term (current) drug therapy: Secondary | ICD-10-CM | POA: Diagnosis not present

## 2020-04-03 DIAGNOSIS — Z419 Encounter for procedure for purposes other than remedying health state, unspecified: Secondary | ICD-10-CM | POA: Diagnosis not present

## 2020-04-03 DIAGNOSIS — I10 Essential (primary) hypertension: Secondary | ICD-10-CM | POA: Diagnosis present

## 2020-04-03 DIAGNOSIS — S42402A Unspecified fracture of lower end of left humerus, initial encounter for closed fracture: Secondary | ICD-10-CM

## 2020-04-03 DIAGNOSIS — S42412A Displaced simple supracondylar fracture without intercondylar fracture of left humerus, initial encounter for closed fracture: Secondary | ICD-10-CM | POA: Diagnosis present

## 2020-04-03 DIAGNOSIS — D62 Acute posthemorrhagic anemia: Secondary | ICD-10-CM | POA: Diagnosis not present

## 2020-04-03 DIAGNOSIS — F17210 Nicotine dependence, cigarettes, uncomplicated: Secondary | ICD-10-CM | POA: Diagnosis present

## 2020-04-03 DIAGNOSIS — F17200 Nicotine dependence, unspecified, uncomplicated: Secondary | ICD-10-CM | POA: Diagnosis present

## 2020-04-03 DIAGNOSIS — Z923 Personal history of irradiation: Secondary | ICD-10-CM | POA: Diagnosis not present

## 2020-04-03 DIAGNOSIS — Z800 Family history of malignant neoplasm of digestive organs: Secondary | ICD-10-CM | POA: Diagnosis not present

## 2020-04-03 DIAGNOSIS — E871 Hypo-osmolality and hyponatremia: Secondary | ICD-10-CM | POA: Diagnosis present

## 2020-04-03 DIAGNOSIS — G9341 Metabolic encephalopathy: Secondary | ICD-10-CM

## 2020-04-03 DIAGNOSIS — Z87442 Personal history of urinary calculi: Secondary | ICD-10-CM | POA: Diagnosis not present

## 2020-04-03 DIAGNOSIS — M7022 Olecranon bursitis, left elbow: Secondary | ICD-10-CM | POA: Diagnosis present

## 2020-04-03 DIAGNOSIS — Z8546 Personal history of malignant neoplasm of prostate: Secondary | ICD-10-CM | POA: Diagnosis not present

## 2020-04-03 DIAGNOSIS — Z20822 Contact with and (suspected) exposure to covid-19: Secondary | ICD-10-CM | POA: Diagnosis present

## 2020-04-03 DIAGNOSIS — E876 Hypokalemia: Secondary | ICD-10-CM | POA: Diagnosis present

## 2020-04-03 DIAGNOSIS — Y92019 Unspecified place in single-family (private) house as the place of occurrence of the external cause: Secondary | ICD-10-CM | POA: Diagnosis not present

## 2020-04-03 DIAGNOSIS — S42492A Other displaced fracture of lower end of left humerus, initial encounter for closed fracture: Secondary | ICD-10-CM | POA: Diagnosis present

## 2020-04-03 DIAGNOSIS — E119 Type 2 diabetes mellitus without complications: Secondary | ICD-10-CM | POA: Diagnosis present

## 2020-04-03 DIAGNOSIS — W1839XA Other fall on same level, initial encounter: Secondary | ICD-10-CM | POA: Diagnosis present

## 2020-04-03 DIAGNOSIS — E785 Hyperlipidemia, unspecified: Secondary | ICD-10-CM | POA: Diagnosis present

## 2020-04-03 DIAGNOSIS — Z7982 Long term (current) use of aspirin: Secondary | ICD-10-CM | POA: Diagnosis not present

## 2020-04-03 LAB — URINALYSIS, ROUTINE W REFLEX MICROSCOPIC
Bilirubin Urine: NEGATIVE
Glucose, UA: NEGATIVE mg/dL
Hgb urine dipstick: NEGATIVE
Ketones, ur: 5 mg/dL — AB
Leukocytes,Ua: NEGATIVE
Nitrite: NEGATIVE
Protein, ur: NEGATIVE mg/dL
Specific Gravity, Urine: 1.021 (ref 1.005–1.030)
pH: 5 (ref 5.0–8.0)

## 2020-04-03 LAB — CBC WITH DIFFERENTIAL/PLATELET
Abs Immature Granulocytes: 0.03 10*3/uL (ref 0.00–0.07)
Basophils Absolute: 0 10*3/uL (ref 0.0–0.1)
Basophils Relative: 0 %
Eosinophils Absolute: 0 10*3/uL (ref 0.0–0.5)
Eosinophils Relative: 0 %
HCT: 33 % — ABNORMAL LOW (ref 39.0–52.0)
Hemoglobin: 11.8 g/dL — ABNORMAL LOW (ref 13.0–17.0)
Immature Granulocytes: 0 %
Lymphocytes Relative: 8 %
Lymphs Abs: 0.7 10*3/uL (ref 0.7–4.0)
MCH: 31.6 pg (ref 26.0–34.0)
MCHC: 35.8 g/dL (ref 30.0–36.0)
MCV: 88.5 fL (ref 80.0–100.0)
Monocytes Absolute: 0.9 10*3/uL (ref 0.1–1.0)
Monocytes Relative: 11 %
Neutro Abs: 6.4 10*3/uL (ref 1.7–7.7)
Neutrophils Relative %: 81 %
Platelets: 159 10*3/uL (ref 150–400)
RBC: 3.73 MIL/uL — ABNORMAL LOW (ref 4.22–5.81)
RDW: 14.4 % (ref 11.5–15.5)
WBC: 8.1 10*3/uL (ref 4.0–10.5)
nRBC: 0 % (ref 0.0–0.2)

## 2020-04-03 LAB — BASIC METABOLIC PANEL
Anion gap: 7 (ref 5–15)
BUN: 14 mg/dL (ref 8–23)
CO2: 28 mmol/L (ref 22–32)
Calcium: 8.3 mg/dL — ABNORMAL LOW (ref 8.9–10.3)
Chloride: 98 mmol/L (ref 98–111)
Creatinine, Ser: 1 mg/dL (ref 0.61–1.24)
GFR, Estimated: >60 mL/min (ref >60)
Glucose, Bld: 120 mg/dL — ABNORMAL HIGH (ref 70–99)
Potassium: 3.2 mmol/L — ABNORMAL LOW (ref 3.5–5.1)
Sodium: 133 mmol/L — ABNORMAL LOW (ref 135–145)

## 2020-04-03 MED ORDER — METHOCARBAMOL 500 MG PO TABS: 500.0000 mg | ORAL_TABLET | Freq: Two times a day (BID) | ORAL | Status: DC | PRN

## 2020-04-03 MED ORDER — CALCIUM GLUCONATE-NACL 1-0.675 GM/50ML-% IV SOLN
1.0000 g | Freq: Once | INTRAVENOUS | Status: AC
  Administered 2020-04-03: 18:00:00 1000 mg via INTRAVENOUS
  Filled 2020-04-03: qty 50

## 2020-04-03 MED ORDER — OXYCODONE HCL 5 MG PO TABS: 5.0000 mg | ORAL_TABLET | ORAL | Status: DC | PRN

## 2020-04-03 MED ORDER — METHOCARBAMOL 1000 MG/10ML IJ SOLN
500.0000 mg | Freq: Four times a day (QID) | INTRAVENOUS | Status: DC | PRN
  Filled 2020-04-03: qty 5

## 2020-04-03 NOTE — Progress Notes (Signed)
ERROR: this note is incorrect, meant for different patient. No delay on imaging due to implantation. Disregard previous note from MRI.

## 2020-04-03 NOTE — Consult Note (Signed)
Brent Griffin. MC:[id number] DOB: [birth year] DOA: 04/01/2020 PCP: [name], PA-C   Requesting physician: Mabeline Caras, MD Date of consultation: 04/03/20 Reason for consultation: Altered mental  Impression/Recommendations Active Problems:   Closed fracture of left distal humerus    1. Closed fracture of the left distal humerus 2/2 fall: Patient status post ORIF 12/28 -Per Ortho  2. Acute metabolic encephalopathy: Patient normally is alert and oriented x3, but since surgery has been more slowed responses. Currently not oriented to place, time, or circumstance. At this time question possible causes including CVA possibly related with a low flow state during surgery, infection, medication induced with opioids/anesthesia, or postoperative delirium. If rule out all other causes would suspect postoperative delirium which in most cases less 1 week or less but can last longer in patients with underlying memory or cognitive issues. -Neuro checks -Check MRI of the brain -Check urinalysis -Check chest x-ray  -limiting opioid and muscle relaxants to see if symptoms improve  3. Hyponatremia/hypokalemia/hypocalcemia: Acute. Sodium 133, potassium 3.2, and calcium 8.3. Patient on a diuretic which likely could be causing symptoms. Patient ordered to be given potassium replacement. -Give 1 g of calcium gluconate -recheck electrolytes in a.m. and replace as needed  4. Essential hypertension -would resume all home meds at discharge  5. Postoperative anemia: Hemoglobin went from 13.5 on day of admission down to 11.8 g/dL with normal MCV and MCH. Vital signs otherwise noted to be stable -Continue to monitor   I will followup again tomorrow. Please contact me if I can be of assistance in the meanwhile. Thank you for this consultation.  Chief Complaint: Fall  HPI:  Brent Beseda. is a 74 y.o. male with medical history significant  of hypertension, hyperlipidemia, diabetes mellitus type 2, remote alcohol abuse, arthritis, and prostate cancer s/p radiation treatment presents after getting pulled down by his dog on 12/22. He was seen in the ED the following day found to have a communicated complex left elbow fracture with referred to orthopedics. Due to the holiday schedule he was admitted into the hospital for surgical correction on 12/28. He was admitted to Dr. Amedeo Plenty and underwent successful open reduction internal fixation olecranon osteotomy and repair reconstruction with ulnar nerve decompression and transposition of left elbow. Following the procedure patient was noted to be slowed in his responses. Wife notes normally patient is alert and oriented to person, place, and time although she makes it seems that he may have some early signs possibly of dementia. Currently, patient states that it is 49, that he is in [place] and not in [facility], and is unable to tell me why and he needed to come into the hospital. Symptoms were initially thought possibly related to anesthesia, but has had significant time for that wear off. Patient has not been receiving a lot of pain medication. He complains only of mild nonproductive cough and still ha the Foley catheter in place.   Review of Systems  Constitutional: Negative for fever.  Respiratory: Positive for cough.   Genitourinary: Negative for dysuria.  Musculoskeletal: Positive for joint pain.  Neurological: Negative for speech change and focal weakness.     Past Medical History:  Diagnosis Date  . Arthritis    thumb right hand  . Cancer ([place])    Prostrate- tx Radiation  . Diabetes mellitus    type II, not taking medications - 03/31/20  . History of kidney stones    x  3  . Hyperlipidemia   . Hypertension   . Metatarsalgia    left foot  . 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 COLONOSCOPY     . FOOT FRACTURE SURGERY Bilateral   . FOOT SURGERY Left 12/2018  . LITHOTRIPSY    . MOUTH SURGERY  1990  . ORIF ELBOW FRACTURE Left 04/01/2020   Procedure: OPEN REDUCTION INTERNAL FIXATION (ORIF) SUPRACONDYLAR ELBOW  FRACTURE, OLECRANON OSTEOTOMY AND BURSECTOMY;  Surgeon: Roseanne Kaufman, MD;  Location: [place];  Service: Orthopedics;  Laterality: Left;  . ROTATOR CUFF REPAIR Right 2018  . ULNAR NERVE TRANSPOSITION Left 04/01/2020   Procedure: ULNAR NERVE RELEASE AND TRANSPOSITION;  Surgeon: Roseanne Kaufman, MD;  Location: [place];  Service: Orthopedics;  Laterality: Left;  Marland Kitchen VASECTOMY  1984   Social History:  reports that he has been smoking cigarettes. He has smoked for the past 30.00 years. He has never used smokeless tobacco. He reports that he does not drink alcohol and does not use drugs.  No Known Allergies Family History  Problem Relation Age of Onset  . Colon cancer Other 62    Prior to Admission medications   Medication Sig Start Date End Date Taking? Authorizing Provider  acetaminophen (TYLENOL) 500 MG tablet Take 1,000 mg by mouth every 6 (six) hours as needed for moderate pain or headache.   Yes [provider]  amLODipine (NORVASC) 10 MG tablet Take 10 mg by mouth. 01/19/16  Yes [provider]  aspirin EC 81 MG tablet Take 81 mg by mouth daily. Swallow whole.   Yes [provider]  Cinnamon 500 MG capsule Take 500 mg by mouth daily.   Yes [provider]  doxycycline (VIBRA-TABS) 100 MG tablet Take 100 mg by mouth daily. 01/19/16  Yes [provider]  folic acid (FOLVITE) 1 MG tablet Take 1 mg by mouth daily.   Yes [provider]  hydrochlorothiazide (HYDRODIURIL) 25 MG tablet Take 25 mg by mouth daily. 02/28/16  Yes [provider]  losartan (COZAAR) 100 MG tablet Take 100 mg by mouth daily. 04/20/16  Yes [provider]  Melatonin 5 MG CAPS Take 10-15 mg by mouth at bedtime as needed (sleep).   Yes  [provider]  metoprolol succinate (TOPROL-XL) 100 MG 24 hr tablet Take 150 mg by mouth at bedtime. 05/18/16  Yes [provider]  Multiple Vitamin (MULTI-VITAMINS) TABS Take 1 tablet by mouth daily.   Yes [provider]  naproxen (NAPROSYN) 500 MG tablet Take 500 mg by mouth 2 (two) times daily as needed for moderate pain. 12/03/15  Yes [provider]  Omega-3 Fatty Acids (OMEGA 3 PO) Take 2 capsules by mouth daily.   Yes [provider]  oxyCODONE-acetaminophen (PERCOCET/ROXICET) 5-325 MG tablet Take 1 tablet by mouth every 4 (four) hours as needed for severe pain. 03/27/20  Yes Tegeler, Gwenyth Allegra, MD  potassium chloride SA (KLOR-CON) 20 MEQ tablet Take 20 mEq by mouth 2 (two) times daily.   Yes [provider]  rosuvastatin (CRESTOR) 10 MG tablet Take 10 mg by mouth at bedtime.   Yes [provider]  sodium chloride (OCEAN) 0.65 % SOLN nasal spray Place 1 spray into both nostrils as needed for congestion.   Yes [provider]  tiZANidine (ZANAFLEX) 4 MG tablet Take 4 mg by mouth 2 (two) times daily as needed for muscle spasms. 04/21/18  Yes  [provider]  TURMERIC PO Take 1 tablet by mouth daily.   Yes [provider]  vitamin B-12 (CYANOCOBALAMIN) 1000 MCG tablet Take 1,000 mcg by mouth daily.   Yes [provider]  Menthol-Methyl Salicylate (SALONPAS PAIN RELIEF PATCH EX) Apply 1-2 patches topically daily as needed (pain).    [provider]  Dola Argyle LANCETS 33G MISC USE ONE LANCET DAILY TO TEST BLOOD GLUCOSE 05/16/16   [provider]  ONETOUCH VERIO test strip USE ONE STRIP DAILY TO TEST BLOOD GLUCOSE. 05/15/16   [provider]   Physical Exam:  Constitutional: Elderly male who appears to be in no acute distress Vitals:   04/02/20 2056 04/03/20 0338 04/03/20 0827 04/03/20 1442  BP: 118/79 125/78 126/68 106/65  Pulse: 74 67 87 70  Resp: 17 17 20 18    Temp: 99.4 F (37.4 C) 98 F (36.7 C) 100.1 F (37.8 C) 99.1 F (37.3 C)  TempSrc: Oral  Oral Oral  SpO2: 94% 94% 94% 96%  Weight:      Height:       Eyes: PERRL, lids and conjunctivae normal ENMT: Mucous membranes are moist. Posterior pharynx clear of any exudate or lesions.  Neck: normal, supple, no masses, no thyromegaly Respiratory:  Cardiovascular: Regular rate and rhythm, no murmurs / rubs / gallops. No extremity edema. 2+ pedal pulses. No carotid bruits.  Abdomen: no tenderness, no masses palpated. No hepatosplenomegaly. Bowel sounds positive.  Musculoskeletal: no clubbing / cyanosis. Swelling left hand currently in cast Skin: no rashes, lesions, ulcers. No induration Neurologic: CN 2-12 grossly intact.  Strength 5/5 in all 4.  Psychiatric: Alert and oriented to self at this time.  Labs on Admission:  Basic Metabolic Panel: Recent Labs  Lab 04/01/20 1235 04/03/20 0223  NA 141 133*  K 3.3* 3.2*  CL 106 98  CO2 25 28  GLUCOSE 93 120*  BUN 12 14  CREATININE 0.89 1.00  CALCIUM 8.7* 8.3*   Liver Function Tests: No results for input(s): AST, ALT, ALKPHOS, BILITOT, PROT, ALBUMIN in the last 168 hours. No results for input(s): LIPASE, AMYLASE in the last 168 hours. No results for input(s): AMMONIA in the last 168 hours. CBC: Recent Labs  Lab 04/01/20 1235 04/03/20 0223  WBC 5.0 8.1  NEUTROABS  --  6.4  HGB 13.5 11.8*  HCT 41.3 33.0*  MCV 93.2 88.5  PLT 161 159   Cardiac Enzymes: No results for input(s): CKTOTAL, CKMB, CKMBINDEX, TROPONINI in the last 168 hours. BNP: Invalid input(s): POCBNP CBG: Recent Labs  Lab 04/01/20 1207 04/01/20 1410 04/01/20 1959  GLUCAP 92 93 75    Radiological Exams on Admission: DG Elbow 2 Views Left  Result Date: 04/01/2020 CLINICAL DATA:  Elective surgery. EXAM: LEFT ELBOW - 2 VIEW; DG C-ARM 1-60 MIN COMPARISON:  Preoperative elbow CT 03/27/2020 FINDINGS: Eleven fluoroscopic spot images of the left elbow obtained in  the operating room. Placement of medial and lateral plate and multi screw fixation of comminuted distal humerus fracture. Placement of screw in trapeze wire in the olecranon. Total fluoroscopy time 34 seconds. Total dose 1.43 mGy. IMPRESSION: Procedural fluoroscopy during ORIF distal humerus fracture and placement of hardware in the proximal ulna. Electronically Signed   By: 03/29/2020 M.D.   On: 04/01/2020 18:42   DG C-Arm 1-60 Min  Result Date: 04/01/2020 CLINICAL DATA:  Elective surgery. EXAM: LEFT ELBOW - 2 VIEW; DG C-ARM 1-60 MIN COMPARISON:  Preoperative elbow CT 03/27/2020 FINDINGS: Eleven fluoroscopic spot  images of the left elbow obtained in the operating room. Placement of medial and lateral plate and multi screw fixation of comminuted distal humerus fracture. Placement of screw in trapeze wire in the olecranon. Total fluoroscopy time 34 seconds. Total dose 1.43 mGy. IMPRESSION: Procedural fluoroscopy during ORIF distal humerus fracture and placement of hardware in the proximal ulna. Electronically Signed   By: Keith Rake M.D.   On: 04/01/2020 18:42    EKG: Independently reviewed. Sinus rhythm at 73 bpm with prolonged QT of 513.  Time spent: >45 minutes  [facility]   If 7PM-7AM, please contact night-coverage

## 2020-04-03 NOTE — Progress Notes (Signed)
Patient ID: Brent Griffin., male   DOB: [birth year], 74 y.o.   MRN: [id number] Patient seen today at bedside.  The patient is alert to the day which is Thursday but offered that the month is May.  I redirected him and he states he does realize it is December.  Nevertheless, he has had just a bit of slowness which I would not expect at this point.  I feel he has had time for the anesthesia to wear off.  He was given Dilaudid last night 1 mg and at 5 PM or so oxycodone.  Given these issues I would like to see if we can get medicine to see him and would obtain a CT of his head so that we can ascertain if he has had any type of TIA events or if he is just slow to recover from the anesthetic issues.  His hand has good flexion and extension about the fingers.  He has little sensory loss about the ulnar nerve which is expected.  The other fingers are sensate.  There is no signs of compartment syndrome.  His bandage is not loose and there is been no seepage in the bandage in terms of any drainage.  He states his pain is better.  I have reviewed his notes from therapy.  At present juncture he has no evidence of DVT in the lower extremity he moves his feet well and feels comfortable with his motor function about the feet.  His right arm is stable there is no lymphadenopathy.  His chest appears to be clear.  I performed a comprehensive look today.  Laboratory results are in the chart.  He does have some anemia secondary to blood loss at the time of surgery.  He does have a potassium which is low and has a history of potassium replacement daily.  At present juncture he is stable orthopedically but I would like for him to be seen by medicine to make sure he is safe to transition to home.  I recommend a CT of the head which we will order and see if medicine sees any glaring issues.  I will await word from therapy on how he does today and consider home health if necessary.  I discussed these issues with him at  bedside and with the nursing staff.  Jeffery Gammell MD [phone]

## 2020-04-03 NOTE — Progress Notes (Signed)
Pt has Conditional pacer and leads. MRI TBD tomorrow during day when radiology RN can program implant. Floor or ED RN will have to monitor. Dr. Dawley aware.  

## 2020-04-03 NOTE — Progress Notes (Signed)
Physical Therapy Treatment Patient Details Name: Brent Griffin. 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  Pt is more off balance and continuing to struggle with return on demonstration of use of SPC or any AD.  He is also a bit confused and did talk with MD about his change from last PT visit.  Per MD agreement the discharge plan is updated to SNF, but will work on finding a way to improve him and return to previous plan.  Focus on goals of PT with progression of standing balance skills and training of gait as tolerated.  Pt is returned to bed with LUE supported and elevated for reduction of edema.  Dr Amanda Pea asks for PT to call him post visit to update and let him know if pt is more able to move with less help, and whether PT can upgrade the discharge plan.  Follow Up Recommendations  SNF     Equipment Recommendations  None recommended by PT    Recommendations for Other Services       Precautions / Restrictions Precautions Precautions: Fall Precaution Comments: NWB on LUE Required Braces or Orthoses: Sling Restrictions Weight Bearing Restrictions: Yes LUE Weight Bearing: Non weight bearing    Mobility  Bed Mobility Overal bed mobility: Needs Assistance Bed Mobility: Supine to Sit;Sit to Supine     Supine to sit: Mod assist Sit to supine: Mod assist      Transfers Overall transfer level: Needs assistance Equipment used: Straight cane;Rolling walker (2 wheeled) Transfers: Sit to/from  Stand Sit to Stand: Mod assist         General transfer comment: used SPC with no success in safe steps, sidesteps on side of bed with poor quality.  RW used for RUE support only with better control  Ambulation/Gait Ambulation/Gait assistance: Independent;Min assist;Mod assist Gait Distance (Feet): 4 Feet Assistive device: Rolling walker (2 wheeled);1 person hand held assist;Straight cane Gait Pattern/deviations: Step-to pattern;Wide base of support;Decreased stride length Gait velocity: redcued Gait velocity interpretation: <1.8 ft/sec, indicate of risk for recurrent falls General Gait Details: persisten issues of cane placement but very much unsteady today and unsafe, better with support to use RW with RUE only   Stairs             Wheelchair Mobility    Modified Rankin (Stroke Patients Only)       Balance     Sitting balance-Leahy Scale: Fair       Standing balance-Leahy Scale: Poor                              Cognition Arousal/Alertness: Awake/alert Behavior During Therapy: Impulsive Overall Cognitive Status: Impaired/Different from baseline Area of Impairment: Safety/judgement;Problem solving;Awareness;Following commands;Attention                   Current Attention Level: Selective   Following Commands: Follows one step commands inconsistently Safety/Judgement: Decreased awareness of safety;Decreased awareness of deficits Awareness: Intellectual Problem Solving: Slow processing;Requires verbal cues;Requires  tactile cues        Exercises      General Comments General comments (skin integrity, edema, etc.): pt is having trouble with safety of gait and does not followcues well today.  Requires up to mod assist to maneuver due to increased loss of balance and poor return on demonstration with gait and AD      Pertinent Vitals/Pain Pain Assessment: Faces Faces Pain Scale: Hurts little more Pain Location: L arm Pain  Intervention(s): Monitored during session;Premedicated before session;Repositioned    Home Living                      Prior Function            PT Goals (current goals can now be found in the care plan section) Acute Rehab PT Goals Patient Stated Goal: go home and hurt less Progress towards PT goals: Not progressing toward goals - comment    Frequency    Min 4X/week      PT Plan Discharge plan needs to be updated    Co-evaluation              AM-PAC PT "6 Clicks" Mobility   Outcome Measure  Help needed turning from your back to your side while in a flat bed without using bedrails?: A Little Help needed moving from lying on your back to sitting on the side of a flat bed without using bedrails?: A Lot Help needed moving to and from a bed to a chair (including a wheelchair)?: A Lot Help needed standing up from a chair using your arms (e.g., wheelchair or bedside chair)?: A Lot Help needed to walk in hospital room?: A Lot Help needed climbing 3-5 steps with a railing? : A Lot 6 Click Score: 13    End of Session Equipment Utilized During Treatment: Gait belt Activity Tolerance: Treatment limited secondary to medical complications (Comment);Patient limited by lethargy Patient left: in bed;with call bell/phone within reach;with bed alarm set Nurse Communication: Mobility status PT Visit Diagnosis: Unsteadiness on feet (R26.81);History of falling (Z91.81);Other abnormalities of gait and mobility (R26.89)     Time: 8756-4332 PT Time Calculation (min) (ACUTE ONLY): 33 min  Charges:  $Therapeutic Activity: 23-37 mins             Ivar Drape 04/03/2020, 11:46 PM  Samul Dada, PT MS Acute Rehab Dept. Number: [facility] [phone] and [facility] [phone]

## 2020-04-04 DIAGNOSIS — Z419 Encounter for procedure for purposes other than remedying health state, unspecified: Secondary | ICD-10-CM | POA: Diagnosis not present

## 2020-04-04 DIAGNOSIS — G9341 Metabolic encephalopathy: Secondary | ICD-10-CM | POA: Diagnosis not present

## 2020-04-04 LAB — CBC WITH DIFFERENTIAL/PLATELET
Abs Immature Granulocytes: 0.02 10*3/uL (ref 0.00–0.07)
Basophils Absolute: 0 10*3/uL (ref 0.0–0.1)
Basophils Relative: 0 %
Eosinophils Absolute: 0 10*3/uL (ref 0.0–0.5)
Eosinophils Relative: 0 %
HCT: 33.6 % — ABNORMAL LOW (ref 39.0–52.0)
Hemoglobin: 11.9 g/dL — ABNORMAL LOW (ref 13.0–17.0)
Immature Granulocytes: 0 %
Lymphocytes Relative: 9 %
Lymphs Abs: 0.6 10*3/uL — ABNORMAL LOW (ref 0.7–4.0)
MCH: 31.9 pg (ref 26.0–34.0)
MCHC: 35.4 g/dL (ref 30.0–36.0)
MCV: 90.1 fL (ref 80.0–100.0)
Monocytes Absolute: 1 10*3/uL (ref 0.1–1.0)
Monocytes Relative: 15 %
Neutro Abs: 5 10*3/uL (ref 1.7–7.7)
Neutrophils Relative %: 76 %
Platelets: 172 10*3/uL (ref 150–400)
RBC: 3.73 MIL/uL — ABNORMAL LOW (ref 4.22–5.81)
RDW: 14.6 % (ref 11.5–15.5)
WBC: 6.7 10*3/uL (ref 4.0–10.5)
nRBC: 0 % (ref 0.0–0.2)

## 2020-04-04 LAB — BASIC METABOLIC PANEL
Anion gap: 12 (ref 5–15)
BUN: 17 mg/dL (ref 8–23)
CO2: 23 mmol/L (ref 22–32)
Calcium: 8.5 mg/dL — ABNORMAL LOW (ref 8.9–10.3)
Chloride: 99 mmol/L (ref 98–111)
Creatinine, Ser: 0.92 mg/dL (ref 0.61–1.24)
GFR, Estimated: >60 mL/min (ref >60)
Glucose, Bld: 107 mg/dL — ABNORMAL HIGH (ref 70–99)
Potassium: 3.4 mmol/L — ABNORMAL LOW (ref 3.5–5.1)
Sodium: 134 mmol/L — ABNORMAL LOW (ref 135–145)

## 2020-04-04 LAB — ALBUMIN: Albumin: 2.6 g/dL — ABNORMAL LOW (ref 3.5–5.0)

## 2020-04-04 LAB — MAGNESIUM: Magnesium: 1.9 mg/dL (ref 1.7–2.4)

## 2020-04-04 MED ORDER — ENOXAPARIN SODIUM 40 MG/0.4ML ~~LOC~~ SOLN
40.0000 mg | SUBCUTANEOUS | Status: DC
  Administered 2020-04-04 – 2020-04-07 (×4): 40 mg via SUBCUTANEOUS
  Filled 2020-04-04 (×4): qty 0.4

## 2020-04-04 NOTE — Progress Notes (Signed)
PROGRESS NOTE    [name].  ZC:[id number] DOB: Nov 23, 1945 DOA: 04/01/2020 PCP: [name], PA-C   No chief complaint on file.  Brief Narrative:  Ahnaf Gorman. is Makinsey Pepitone 74 y.o. male with medical history significant of hypertension, hyperlipidemia, diabetes mellitus type 2, remote alcohol abuse, arthritis, and prostate cancer s/p radiation treatment presents after getting pulled down by his dog on 12/22. He was seen in the ED the following day found to have Eevie Lapp communicated complex left elbow fracture with referred to orthopedics. Due to the holiday schedule he was admitted into the hospital for surgical correction on 12/28. He was admitted to Dr. Amedeo Plenty and underwent successful open reduction internal fixation olecranon osteotomy and repair reconstruction with ulnar nerve decompression and transposition of left elbow. Following the procedure patient was noted to be slowed in his responses. Wife notes normally patient is alert and oriented to person, place, and time although she makes it seems that he may have some early signs possibly of dementia. Currently, patient states that it is 16, that he is in [place] and not in [facility], and is unable to tell me why and he needed to come into the hospital. Symptoms were initially thought possibly related to anesthesia, but has had significant time for that wear off. Patient has not been receiving Latorria Zeoli lot of pain medication. He complains only of mild nonproductive cough and still ha the Foley catheter in place.  Assessment & Plan:   Active Problems:   Closed fracture of left distal humerus   Acute metabolic encephalopathy  1. Closed fracture of the left distal humerus 2/2 fall: Patient status post ORIF 12/28 -Per Ortho  2. Acute metabolic encephalopathy: Patient normally is alert and oriented x3, but since surgery has been more slowed responses. Postoperative/hospital delirium likely etiology.  He's improved  today, Zitlaly Malson&Ox3, not quite to his baseline, but significant improvement per him and his wife.   1. Delirium precautions 2. MRI brain without acute infarct.  Atrophy and chronic microvascular ischemia.  Chronic microhemorrhage to L thalamus. -Check urinalysis - not concerning for UTI  -Check chest x-ray - no active disease -limiting opioid and muscle relaxants as tolerated  3. Elevated Temperature 1. No true temp to 100.4, has 2 temps above 100 2. UA not concerning for infection.  CXR without active disease. 3. Will ask Dr. Amedeo Plenty to evaluate surgical site 4. Follow blood cultures if true fever  5. Currently on ancef  4. Hyponatremia/hypokalemia/hypocalcemia: 1. Replace and follow  2. Follow albumin  3. Hyponatremia is mild, continue to monitor  4. He's on HCTZ, follow   4. Essential hypertension -would resume all home meds at discharge  5. Postoperative anemia: Hemoglobin went from 13.5 on day of admission down to 11.8 g/dL with normal MCV and MCH. Vital signs otherwise noted to be stable -Continue to monitor intermittently            DVT prophylaxis: SCDs. Will discuss with Dr. Amedeo Plenty regarding pharm dvt ppx. Code Status: full Family Communication: wife at bedside Disposition:   Status is: Inpatient  Remains inpatient appropriate because:Inpatient level of care appropriate due to severity of illness   Dispo: per ortho       Consultants:   Ortho is primary  [facility] consulting  Procedures:  open reduction internal fixation with Biomet titanium plate and screw construct left elbow comminuted complex intra-articular low T condylar fracture utilizing allograft bone graft.  We utilized parallel plating for the  fracture process.  #2 ulnar nerve release and anterior transposition left elbow #3 extensive bursectomy left elbow secondary to chronic bursitis #4 olecranon osteotomy and repair left elbow #5 stress radiography performed examined and interpreted by  myself  Antimicrobials:  Anti-infectives (From admission, onward)   Start     Dose/Rate Route Frequency Ordered Stop   04/02/20 0600  ceFAZolin (ANCEF) IVPB 2g/100 mL premix        2 g 200 mL/hr over 30 Minutes Intravenous On call to O.R. 04/01/20 1230 04/01/20 1448   04/02/20 0230  ceFAZolin (ANCEF) IVPB 1 g/50 mL premix        1 g 100 mL/hr over 30 Minutes Intravenous Every 8 hours 04/01/20 2018     04/01/20 2115  ceFAZolin (ANCEF) IVPB 1 g/50 mL premix        1 g 100 mL/hr over 30 Minutes Intravenous NOW 04/01/20 2018 04/01/20 2205      Subjective: No new complaints Jamee Keach&Ox4 today   Objective: Vitals:   04/03/20 1442 04/03/20 1932 04/04/20 0750 04/04/20 1300  BP: 106/65 130/77 120/86 102/65  Pulse: 70 75 87 80  Resp: 18 16 17 19   Temp: 99.1 F (37.3 C) 99.1 F (37.3 C) 99.7 F (37.6 C) 100.2 F (37.9 C)  TempSrc: Oral Oral Oral Oral  SpO2: 96% 99% 96%   Weight:      Height:        Intake/Output Summary (Last 24 hours) at 04/04/2020 1535 Last data filed at 04/03/2020 1953 Gross per 24 hour  Intake 50 ml  Output 300 ml  Net -250 ml   Filed Weights   04/01/20 1204  Weight: 87.1 kg    Examination:  General exam: Appears calm and comfortable  Respiratory system: Clear to auscultation. Respiratory effort normal. Cardiovascular system: S1 & S2 heard, RRR.  Gastrointestinal system: Abdomen is nondistended, soft and nontender.   Central nervous system: Alert and oriented. No focal neurological deficits. Extremities: LUE in sling Skin: No rashes, lesions or ulcers Psychiatry: Judgement and insight appear normal. Mood & affect appropriate.     Data Reviewed: I have personally reviewed following labs and imaging studies  CBC: Recent Labs  Lab 04/01/20 1235 04/03/20 0223  WBC 5.0 8.1  NEUTROABS  --  6.4  HGB 13.5 11.8*  HCT 41.3 33.0*  MCV 93.2 88.5  PLT 161 159    Basic Metabolic Panel: Recent Labs  Lab 04/01/20 1235 04/03/20 0223 04/04/20 0226   NA 141 133* 134*  K 3.3* 3.2* 3.4*  CL 106 98 99  CO2 25 28 23   GLUCOSE 93 120* 107*  BUN 12 14 17   CREATININE 0.89 1.00 0.92  CALCIUM 8.7* 8.3* 8.5*  MG  --   --  1.9    GFR: Estimated Creatinine Clearance: 73.8 mL/min (by C-G formula based on SCr of 0.92 mg/dL).  Liver Function Tests: No results for input(s): AST, ALT, ALKPHOS, BILITOT, PROT, ALBUMIN in the last 168 hours.  CBG: Recent Labs  Lab 04/01/20 1207 04/01/20 1410 04/01/20 1959  GLUCAP 92 93 75     Recent Results (from the past 240 hour(s))  Resp Panel by RT-PCR (Flu Jodie Leiner&B, Covid) Nasopharyngeal Swab     Status: None   Collection Time: 03/27/20  2:05 PM   Specimen: Nasopharyngeal Swab; Nasopharyngeal(NP) swabs in vial transport medium  Result Value Ref Range Status   SARS Coronavirus 2 by RT PCR NEGATIVE NEGATIVE Final    Comment: (NOTE) SARS-CoV-2 target nucleic acids are NOT  DETECTED.  The SARS-CoV-2 RNA is generally detectable in upper respiratory specimens during the acute phase of infection. The lowest concentration of SARS-CoV-2 viral copies this assay can detect is 138 copies/mL. Gabrielle Mester negative result does not preclude SARS-Cov-2 infection and should not be used as the sole basis for treatment or other patient management decisions. Sephira Zellman negative result may occur with  improper specimen collection/handling, submission of specimen other than nasopharyngeal swab, presence of viral mutation(s) within the areas targeted by this assay, and inadequate number of viral copies(<138 copies/mL). Kieley Akter negative result must be combined with clinical observations, patient history, and epidemiological information. The expected result is Negative.  Fact Sheet for Patients:  EntrepreneurPulse.com.au  Fact Sheet for Healthcare Providers:  IncredibleEmployment.be  This test is no t yet approved or cleared by the [place] FDA and  has been authorized for detection and/or diagnosis of  SARS-CoV-2 by FDA under an Emergency Use Authorization (EUA). This EUA will remain  in effect (meaning this test can be used) for the duration of the COVID-19 declaration under Section 564(b)(1) of the Act, 21 U.S.C.section 360bbb-3(b)(1), unless the authorization is terminated  or revoked sooner.       Influenza Shira Bobst by PCR NEGATIVE NEGATIVE Final   Influenza B by PCR NEGATIVE NEGATIVE Final    Comment: (NOTE) The Xpert Xpress SARS-CoV-2/FLU/RSV plus assay is intended as an aid in the diagnosis of influenza from Nasopharyngeal swab specimens and should not be used as Dannell Raczkowski sole basis for treatment. Nasal washings and aspirates are unacceptable for Xpert Xpress SARS-CoV-2/FLU/RSV testing.  Fact Sheet for Patients: EntrepreneurPulse.com.au  Fact Sheet for Healthcare Providers: IncredibleEmployment.be  This test is not yet approved or cleared by the [place] FDA and has been authorized for detection and/or diagnosis of SARS-CoV-2 by FDA under an Emergency Use Authorization (EUA). This EUA will remain in effect (meaning this test can be used) for the duration of the COVID-19 declaration under Section 564(b)(1) of the Act, 21 U.S.C. section 360bbb-3(b)(1), unless the authorization is terminated or revoked.  Performed at [facility], [address] [place], [place] [address]   SARS CORONAVIRUS 2 (TAT 6-24 HRS) Nasopharyngeal Nasopharyngeal Swab     Status: None   Collection Time: 03/31/20  1:56 PM   Specimen: Nasopharyngeal Swab  Result Value Ref Range Status   SARS Coronavirus 2 NEGATIVE NEGATIVE Final    Comment: (NOTE) SARS-CoV-2 target nucleic acids are NOT DETECTED.  The SARS-CoV-2 RNA is generally detectable in upper and lower respiratory specimens during the acute phase of infection. Negative results do not preclude SARS-CoV-2 infection, do not rule out co-infections with other pathogens, and should not be used as the sole  basis for treatment or other patient management decisions. Negative results must be combined with clinical observations, patient history, and epidemiological information. The expected result is Negative.  Fact Sheet for Patients: SugarRoll.be  Fact Sheet for Healthcare Providers: https://www.woods-mathews.com/  This test is not yet approved or cleared by the [place] FDA and  has been authorized for detection and/or diagnosis of SARS-CoV-2 by FDA under an Emergency Use Authorization (EUA). This EUA will remain  in effect (meaning this test can be used) for the duration of the COVID-19 declaration under Se ction 564(b)(1) of the Act, 21 U.S.C. section 360bbb-3(b)(1), unless the authorization is terminated or revoked sooner.  Performed at [facility], [place] [address], [place], [place] [address]   Surgical pcr screen     Status: Abnormal  Collection Time: 04/01/20  1:41 PM   Specimen: Nasal Mucosa; Nasal Swab  Result Value Ref Range Status   MRSA, PCR NEGATIVE NEGATIVE Final   Staphylococcus aureus POSITIVE (Toure Edmonds) NEGATIVE Final    Comment: (NOTE) The Xpert SA Assay (FDA approved for NASAL specimens in patients 60 years of age and older), is one component of Yeray Tomas comprehensive surveillance program. It is not intended to diagnose infection nor to guide or monitor treatment. Performed at [facility], [place] [address] [place], [place] [address]          Radiology Studies: MR BRAIN WO CONTRAST  Result Date: 04/03/2020 CLINICAL DATA:  Mental status change EXAM: MRI HEAD WITHOUT CONTRAST TECHNIQUE: Multiplanar, multiecho pulse sequences of the brain and surrounding structures were obtained without intravenous contrast. COMPARISON:  None. FINDINGS: Brain: Generalized atrophy. Negative for hydrocephalus. Mild to moderate white matter changes with periventricular and deep white matter hyperintensities bilaterally. Chronic  microhemorrhage left thalamus. Negative for mass Negative for acute infarct.  No areas of restricted diffusion. Vascular: Normal arterial flow voids. Skull and upper cervical spine: Negative Sinuses/Orbits: Mucosal edema in the paranasal sinuses most notably in the left maxillary sinus. Negative orbit Other: None IMPRESSION: No acute infarct Atrophy and chronic microvascular ischemia. Chronic microhemorrhage left thalamus. Electronically Signed   By: Franchot Gallo M.D.   On: 04/03/2020 21:22   DG CHEST PORT 1 VIEW  Result Date: 04/03/2020 CLINICAL DATA:  Cough EXAM: PORTABLE CHEST 1 VIEW COMPARISON:  None. FINDINGS: The heart size and mediastinal contours are within normal limits. Both lungs are clear. The visualized skeletal structures are unremarkable. IMPRESSION: No active disease. Electronically Signed   By: Donavan Foil M.D.   On: 04/03/2020 18:49        Scheduled Meds: . vitamin C  1,000 mg Oral Daily  . Chlorhexidine Gluconate Cloth  6 each Topical Q0600  . docusate sodium  100 mg Oral BID  . hydrochlorothiazide  25 mg Oral Daily  . influenza vaccine adjuvanted  0.5 mL Intramuscular Tomorrow-1000  . losartan  100 mg Oral Daily  . metoprolol succinate  150 mg Oral Daily  . mupirocin ointment  1 application Nasal BID  . potassium chloride SA  20 mEq Oral BID  . rosuvastatin  10 mg Oral QHS   Continuous Infusions: .  ceFAZolin (ANCEF) IV 1 g (04/04/20 1405)  . lactated ringers Stopped (04/02/20 0308)  . methocarbamol (ROBAXIN) IV       LOS: 1 day    Time spent: over 30 min    Fayrene Helper, MD [facility]   To contact the attending provider between 7A-7P or the covering provider during after hours 7P-7A, please log into the web site [email or web address] and access using universal [place] password for that web site. If you do not have the password, please call the hospital operator.  04/04/2020, 3:35 PM

## 2020-04-04 NOTE — NC FL2 (Signed)
[place] MEDICAID FL2 LEVEL OF CARE SCREENING TOOL     IDENTIFICATION  Patient Name: Brent Griffin. Birthdate: 1945-04-20 Sex: male Admission Date (Current Location): 04/01/2020  [facility] and [place] Number:  Producer, television/film/video and Address:  [facility] [place], [address], [place], [place] [address]      Provider Number: [id number]  Attending Physician Name and Address:  Dominica Severin, MD  Relative Name and Phone Number:  Richard Ritchey [phone]    Current Level of Care: Hospital Recommended Level of Care: Skilled Nursing Facility Prior Approval Number:    Date Approved/Denied:   PASRR Number: [id number]  Discharge Plan: SNF    Current Diagnoses: Patient Active Problem List   Diagnosis Date Noted  . Acute metabolic encephalopathy 04/03/2020  . Closed fracture of left distal humerus 04/01/2020  . Hypokalemia 03/13/2018  . Adjustment insomnia 03/10/2018  . Renal insufficiency 03/10/2018  . Tobacco use disorder 03/10/2018  . Neck pain 12/01/2017  . Erectile dysfunction following radiation therapy 09/14/2017  . Urinary hesitancy 09/08/2017  . Prostate cancer (HCC) 01/16/2016  . Hyperlipidemia LDL goal <100 08/27/2014  . Hyperlipidemia associated with type 2 diabetes mellitus (HCC) 08/27/2014  . Muscle spasm of back 02/13/2014  . Rosacea 02/13/2014  . Scoliosis 02/13/2014  . Essential hypertension 02/12/2014  . Elevated prostate specific antigen (PSA) 01/06/2014  . Muscle spasms of neck 12/17/2013  . Type II diabetes mellitus (HCC) 05/13/2013    Orientation RESPIRATION BLADDER Height & Weight     Self,Situation  Normal Continent Weight: 192 lb (87.1 kg) Height:  5\' 10"  (177.8 cm)  BEHAVIORAL SYMPTOMS/MOOD NEUROLOGICAL BOWEL NUTRITION STATUS      Continent Diet (See DC summary)  AMBULATORY STATUS COMMUNICATION OF NEEDS Skin   Limited Assist Verbally Surgical wounds (SI L Arm, compression wrap)                        Personal Care Assistance Level of Assistance  Bathing,Feeding,Dressing Bathing Assistance: Limited assistance Feeding assistance: Independent Dressing Assistance: Limited assistance     Functional Limitations Info  Sight,Hearing,Speech Sight Info: Adequate Hearing Info: Adequate Speech Info: Adequate    SPECIAL CARE FACTORS FREQUENCY  PT (By licensed PT),OT (By licensed OT)     PT Frequency: 5x week OT Frequency: 5x week            Contractures Contractures Info: Not present    Additional Factors Info  Code Status,Allergies Code Status Info: Full Allergies Info: NKA           Current Medications (04/04/2020):  This is the current hospital active medication list Current Facility-Administered Medications  Medication Dose Route Frequency Provider Last Rate Last Admin  . acetaminophen (TYLENOL) tablet 325-650 mg  325-650 mg Oral Q6H PRN 04/06/2020, MD   650 mg at 04/03/20 1104  . ascorbic acid (VITAMIN C) tablet 1,000 mg  1,000 mg Oral Daily 04/05/20, MD   1,000 mg at 04/04/20 0951  . ceFAZolin (ANCEF) IVPB 1 g/50 mL premix  1 g Intravenous 04/06/20, MD 100 mL/hr at 04/04/20 0508 1 g at 04/04/20 0508  . Chlorhexidine Gluconate Cloth 2 % PADS 6 each  6 each Topical Q0600 04/06/20, MD   6 each at 04/04/20 0506  . docusate sodium (COLACE) capsule 100 mg  100 mg Oral BID 04/06/20, MD   100 mg at 04/04/20 0951  . famotidine (PEPCID) tablet 20 mg  20 mg  Oral BID PRN Roseanne Kaufman, MD      . hydrochlorothiazide (HYDRODIURIL) tablet 25 mg  25 mg Oral Daily Roseanne Kaufman, MD   25 mg at 04/04/20 0951  . influenza vaccine adjuvanted (FLUAD) injection 0.5 mL  0.5 mL Intramuscular Tomorrow-1000 Roseanne Kaufman, MD      . lactated ringers infusion   Intravenous Continuous Roseanne Kaufman, MD   Stopped at 04/02/20 0308  . losartan (COZAAR) tablet 100 mg  100 mg Oral Daily Roseanne Kaufman, MD   100 mg at 04/04/20 0951  . methocarbamol  (ROBAXIN) tablet 500 mg  500 mg Oral Q12H PRN Fuller Plan A, MD       Or  . methocarbamol (ROBAXIN) 500 mg in dextrose 5 % 50 mL IVPB  500 mg Intravenous Q6H PRN Yasiel Goyne, Rondell A, MD      . metoprolol succinate (TOPROL-XL) 24 hr tablet 150 mg  150 mg Oral Daily Audry Pili, MD   150 mg at 04/04/20 0951  . mupirocin ointment (BACTROBAN) 2 % 1 application  1 application Nasal BID Roseanne Kaufman, MD   1 application at [phone]  . ondansetron (ZOFRAN) tablet 4 mg  4 mg Oral Q6H PRN Roseanne Kaufman, MD       Or  . ondansetron (ZOFRAN) injection 4 mg  4 mg Intravenous Q6H PRN Roseanne Kaufman, MD      . oxyCODONE (Oxy IR/ROXICODONE) immediate release tablet 5 mg  5 mg Oral Q4H PRN Vlada Uriostegui, Rondell A, MD      . potassium chloride SA (KLOR-CON) CR tablet 20 mEq  20 mEq Oral BID Roseanne Kaufman, MD   20 mEq at 04/04/20 0951  . promethazine (PHENERGAN) suppository 12.5 mg  12.5 mg Rectal Q6H PRN Roseanne Kaufman, MD      . rosuvastatin (CRESTOR) tablet 10 mg  10 mg Oral QHS Roseanne Kaufman, MD   10 mg at 04/03/20 2206     Discharge Medications: Please see discharge summary for a list of discharge medications.  Relevant Imaging Results:  Relevant Lab Results:   Additional Information [id number]  Coralee Pesa, LCSWA

## 2020-04-04 NOTE — Progress Notes (Signed)
Patient ID: Brent Griffin., male   DOB: June 08, 1945, 74 y.o.   MRN: [id number] Patient is now postop day three.  Patient has improvement in his mentation.  He knows events of the day.  He is alert to place, myself, and past events.  Nursing staff states he is still not perfect in terms of his command following etc.  I have discussed with his family as well as therapy and nursing his progress.  Overall he feels better and looks a lot better.  His MRI is negative thus we have ruled out a CVA event.  This appears to be postop delirium.  He has good motor function about the hand his swelling is markedly decreased and he is certainly mobilized in an improved fashion.  I walked him around the hallway today and he is much steadier with his gait.  Abdomen is nontender.  Chest is clear.  No evidence of DVT in the lower extremities.  At present juncture his arm looks very good.  I will wait to change his bandage given the improvement.  I discussed with Dr. Lowell Guitar his upper extremity predicament and general health is well.  We appreciate their help.  I feel he is safe to start the Lovenox.  I will check on him tomorrow.  Once again we will plan for movement towards a skilled nursing facility given the extra needs and the postop delirium.  All questions have been encouraged and answered.  I will DC all of his pain medicines so that we do not have anything that gets in the way of his progress.  He is minimally painful at best.  Amanda Pea MD

## 2020-04-04 NOTE — Progress Notes (Signed)
Physical Therapy Treatment Patient Details Name: Brent Griffin. 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 The pt was able to complete bed mobility and initial sit-stand transfers with modA as he requires assist and cues to mobilize to EOB, lift trunk from [facility], and to power up and steady in stance. The pt stands with a strong posterior lean due to reports of pain in toes that results in him standing on his heels only. This was mildly improved with use of shoes for improved comfort, but the pt continues to require modA to steady in both static stance and gait with use of SPC. The pt also continues to require repeated cues for NWB LUE and use of cane throughout session.   After discussion with the pt and his wife, I feel d/c to SNF for continued rehab for balance and gait training will be safest d/c plan for the pt as his wife works and the pt is not safe to be home alone without supervision at this time. I feel he would be at significant risk for falls if he were to d/c home at this time and am therefore maintaining recommendation at SNF for rehab.    Follow Up Recommendations  SNF;Supervision/Assistance - 24 hour     Equipment Recommendations  None recommended by PT    Recommendations for Other Services       Precautions / Restrictions Precautions Precautions: Fall Required Braces or Orthoses:  Sling Restrictions Weight Bearing Restrictions: Yes LUE Weight Bearing: Non weight bearing Other Position/Activity Restrictions: pt with poor adherence, needs fequent reminders with all mobility    Mobility  Bed Mobility Overal bed mobility: Needs Assistance Bed Mobility: Supine to Sit     Supine to sit: Mod assist;HOB elevated     General bed mobility comments: modA to raise trunk from elevated HOB and maintain balance initially, scoot to EOB without use of LUE  Transfers Overall transfer level: Needs assistance Equipment used: Straight cane Transfers: Sit to/from Stand Sit to Stand: Mod assist         General transfer comment: modA with HHA to power up to standing then handed SPC as pt was unable to follow simple, direct commands in regards to safe use of SPC with standing. modA to steady in stance as pt standing on heels onle  Ambulation/Gait Ambulation/Gait assistance: Mod assist;Min assist Gait Distance (Feet): 20 Feet Assistive device: Straight cane Gait Pattern/deviations: Step-through pattern;Decreased stride length;Shuffle;Staggering right;Staggering left;Leaning posteriorly Gait velocity: redcued Gait velocity interpretation: <1.31 ft/sec, indicative of household ambulator General Gait Details: continues with strong posterior lean as pt attempting to walk on heels only due to pain in bilateral toes (pt and wife report this is ongoing problem for years). required min-modA at all times to maintain upright as well as frequent cues for use of cane which pt did not heed.       Balance Overall balance assessment: Needs assistance Sitting-balance support: No upper extremity supported;Feet supported Sitting balance-Leahy Scale: Fair  Sitting balance - Comments: able to reach for BLE, but unable to reach far enough to don socks Postural control: Posterior lean   Standing balance-Leahy Scale: Poor Standing balance comment: requires modA to steady and single UE support                             Cognition Arousal/Alertness: Awake/alert Behavior During Therapy: Flat affect Overall Cognitive Status: Impaired/Different from baseline Area of Impairment: Attention;Memory;Following commands;Safety/judgement;Awareness;Problem solving                   Current Attention Level: Focused Memory: Decreased recall of precautions;Decreased short-term memory Following Commands: Follows one step commands inconsistently Safety/Judgement: Decreased awareness of safety;Decreased awareness of deficits Awareness: Intellectual Problem Solving: Slow processing;Requires verbal cues;Requires tactile cues General Comments: pt alert, but with veryflat affect and minimal verbal contribution during session. Required repeated cues for NWB LUE, and constant cues for safety awareness, use of cane, safe mobility. pt with no active corrections in regards to safety or positioning without direct cues      Exercises      General Comments General comments (skin integrity, edema, etc.): continued poor safety awareness and gait, flat affect with minimal command following even with repeated cues. strong posterior lean with all upright activity.      Pertinent Vitals/Pain Pain Assessment: 0-10 Pain Score: 6  Pain Location: L arm and toes Pain Descriptors / Indicators: Discomfort;Grimacing Pain Intervention(s): Limited activity within patient's tolerance;Monitored during session;Repositioned           PT Goals (current goals can now be found in the care plan section) Acute Rehab PT Goals Patient Stated Goal: go home and hurt less PT Goal Formulation: With patient Time For Goal Achievement: 04/16/20 Potential to Achieve Goals: Good Progress towards PT goals: Progressing toward goals    Frequency    Min 3X/week      PT Plan Current plan remains appropriate       AM-PAC PT "6 Clicks" Mobility   Outcome Measure  Help needed turning from your back to  your side while in a flat bed without using bedrails?: A Little Help needed moving from lying on your back to sitting on the side of a flat bed without using bedrails?: A Lot Help needed moving to and from a bed to a chair (including a wheelchair)?: A Lot Help needed standing up from a chair using your arms (e.g., wheelchair or bedside chair)?: A Lot Help needed to walk in hospital room?: A Lot Help needed climbing 3-5 steps with a railing? : A Lot 6 Click Score: 13    End of Session Equipment Utilized During Treatment: Gait belt Activity Tolerance: Patient tolerated treatment well Patient left: in chair;with call bell/phone within reach;with chair alarm set;with family/visitor present Nurse Communication: Mobility status (confusion, d/c plan of SNF) PT Visit Diagnosis: Unsteadiness on feet (R26.81);History of falling (Z91.81);Other abnormalities of gait and mobility (R26.89)     Time: [id number] PT Time Calculation (min) (ACUTE ONLY): 34 min  Charges:  $Gait Training: 8-22 mins $Therapeutic Activity: 8-22 mins                     Karma Ganja, PT, DPT   Acute Rehabilitation Department Pager #: [phone]   Otho Bellows 04/04/2020, 11:08 AM

## 2020-04-04 NOTE — TOC Progression Note (Signed)
Transition of Care (TOC) - Progression Note    Patient Details  Name: Brent Griffin. MRN: [id number] Date of Birth: [birth year]  Transition of Care [facility]) CM/SW [place], [place] Phone Number: 04/04/2020, 12:09 PM  Clinical Narrative:     CSW received consult that pt is agreeable to SNF. Before returning home. CSW met with pt and wife at bedside. They are agreeable and stated [place]. Spouse noted a few places, [place] and [place], but said no [facility]. Pt has had [employer] vaccines. CSW will complete workup and faxout. SW will continue to follow for transition of care.  Expected Discharge Plan: [place] Barriers to Discharge: Continued Medical Work up,SNF Pending bed offer  Expected Discharge Plan and Services Expected Discharge Plan: [place]   Discharge Planning Services: CM Consult Post Acute Care Choice: [place] Living arrangements for the past 2 months: Single Family Home                 DME Arranged: Kasandra Knudsen DME Agency: [employer] Date DME Agency Contacted: 04/02/20 Time DME Agency Contacted: [phone] Representative spoke with at DME Agency: CM with [employer] [place]: PT,OT [place] Agency: [place] Date [place]: 04/02/20 Time [place]: 1459 Representative spoke with at [place]: Tommi Rumps, [place] with [place]   Social Determinants of Health ([place]) Interventions    Readmission Risk Interventions No flowsheet data found.

## 2020-04-04 NOTE — Progress Notes (Signed)
Orthopedic Tech Progress Note Patient Details:  Brent Griffin [birth year] [id number] Spoke with MD. Amanda Pea about patient splint. Asked me to drop off supplies for later. Ortho Devices Type of Ortho Device: Long arm splint Ortho Device/Splint Location: LUE Ortho Device/Splint Interventions: Other (comment)   Post Interventions Patient Tolerated: Other (comment) Instructions Provided: Other (comment)   Brent Griffin 04/04/2020, 2:56 PM

## 2020-04-05 DIAGNOSIS — S42402A Unspecified fracture of lower end of left humerus, initial encounter for closed fracture: Secondary | ICD-10-CM | POA: Diagnosis not present

## 2020-04-05 LAB — CBC WITH DIFFERENTIAL/PLATELET
Abs Immature Granulocytes: 0.02 10*3/uL (ref 0.00–0.07)
Basophils Absolute: 0 10*3/uL (ref 0.0–0.1)
Basophils Relative: 0 %
Eosinophils Absolute: 0.1 10*3/uL (ref 0.0–0.5)
Eosinophils Relative: 1 %
HCT: 33.4 % — ABNORMAL LOW (ref 39.0–52.0)
Hemoglobin: 11.5 g/dL — ABNORMAL LOW (ref 13.0–17.0)
Immature Granulocytes: 0 %
Lymphocytes Relative: 14 %
Lymphs Abs: 0.7 10*3/uL (ref 0.7–4.0)
MCH: 31.8 pg (ref 26.0–34.0)
MCHC: 34.4 g/dL (ref 30.0–36.0)
MCV: 92.3 fL (ref 80.0–100.0)
Monocytes Absolute: 0.7 10*3/uL (ref 0.1–1.0)
Monocytes Relative: 14 %
Neutro Abs: 3.3 10*3/uL (ref 1.7–7.7)
Neutrophils Relative %: 71 %
Platelets: 171 10*3/uL (ref 150–400)
RBC: 3.62 MIL/uL — ABNORMAL LOW (ref 4.22–5.81)
RDW: 14.9 % (ref 11.5–15.5)
WBC: 4.8 10*3/uL (ref 4.0–10.5)
nRBC: 0 % (ref 0.0–0.2)

## 2020-04-05 LAB — COMPREHENSIVE METABOLIC PANEL
ALT: 21 U/L (ref 0–44)
AST: 37 U/L (ref 15–41)
Albumin: 2.5 g/dL — ABNORMAL LOW (ref 3.5–5.0)
Alkaline Phosphatase: 67 U/L (ref 38–126)
Anion gap: 10 (ref 5–15)
BUN: 17 mg/dL (ref 8–23)
CO2: 23 mmol/L (ref 22–32)
Calcium: 8.3 mg/dL — ABNORMAL LOW (ref 8.9–10.3)
Chloride: 102 mmol/L (ref 98–111)
Creatinine, Ser: 0.97 mg/dL (ref 0.61–1.24)
GFR, Estimated: >60 mL/min (ref >60)
Glucose, Bld: 95 mg/dL (ref 70–99)
Potassium: 3.7 mmol/L (ref 3.5–5.1)
Sodium: 135 mmol/L (ref 135–145)
Total Bilirubin: 0.7 mg/dL (ref 0.3–1.2)
Total Protein: 5.9 g/dL — ABNORMAL LOW (ref 6.5–8.1)

## 2020-04-05 LAB — PHOSPHORUS: Phosphorus: 2.7 mg/dL (ref 2.5–4.6)

## 2020-04-05 LAB — MAGNESIUM: Magnesium: 2 mg/dL (ref 1.7–2.4)

## 2020-04-05 NOTE — Progress Notes (Signed)
Physical Therapy Treatment Patient Details Name: Brent Griffin. 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 He continues to benefit from cues for sequencing with mobility and reminders of wt bearing status through session, but was able to progress gait and complete multiple sit-stand transfers and navigation of stairs at this time. The pt requires use of SPC and minA to minG to power up and steady with transfers from sit-stand and gait. The pt also continues to require minA for balance and improved control of movement on stairs, but was able to complete navigation of 12 steps with minA and use of single rail.   The pt is moving much better than in recent sessions, and seems to be more aware of deficits and safety, but I still feel that he would need 24/7 supervision and assist (wife works and pt is usually home alone all day) to return home safely. If this can be arranged with family for first few days-week following d/c, I feel the pt could return home with HHPT. If 24/7 assist is not available, I continue to recommend short stint SNF rehab to allow for improved safety with independence following d/c.     Follow Up Recommendations  SNF;Supervision/Assistance - 24 hour ((home with HHPT if 24/7 supervision can be arranged))     Equipment Recommendations  Cane;3in1 (PT)    Recommendations for Other Services       Precautions / Restrictions Precautions Precautions: Fall Required Braces or Orthoses:  Sling Restrictions Weight Bearing Restrictions: Yes LUE Weight Bearing: Non weight bearing Other Position/Activity Restrictions: pt agreeable to sling, needs assist to don    Mobility  Bed Mobility Overal bed mobility: Needs Assistance Bed Mobility: Supine to Sit     Supine to sit: Min assist     General bed mobility comments: minA to move/scoott to EOB with repeated cues to avoid use of LUE  Transfers Overall transfer level: Needs assistance Equipment used: Straight cane Transfers: Sit to/from Stand Sit to Stand: Mod assist;Min assist         General transfer comment: progressed from modA with initial stand and multiple attempts to minA with improved mechanics with use of SPC. practiced x15 through session with improvements noted in movement and stability  Ambulation/Gait Ambulation/Gait assistance: Min assist;Min guard Gait Distance (Feet): 100 Feet Assistive device: Straight cane Gait Pattern/deviations: Step-through pattern;Decreased stride length;Shuffle Gait velocity: 0.2 m/s Gait velocity interpretation: <1.31 ft/sec, indicative of household ambulator General Gait Details: pt with improvement in standing balance at this time, able to static stand without posterior lean. small steps with slowed gait. continues to benefit from cues for proper use of cane, but with significantly improved mechanics from yesterday   Stairs Stairs: Yes Stairs assistance: Min assist Stair Management: One rail Right;Forwards Number of Stairs: 12 General stair comments: minG to minA with fatigue ascending, minA descending for stability and controlled lower. Pt with heavy reliance on rail, step-to pattern       Balance Overall balance assessment: Needs assistance Sitting-balance support: No upper extremity supported;Feet supported Sitting balance-Leahy  Scale: Fair Sitting balance - Comments: able to reach for BLE, but unable to reach far enough to don socks   Standing balance  support: Single extremity supported Standing balance-Leahy Scale: Poor Standing balance comment: reliant on UE support for stance and gait, decreased posterior lean                            Cognition Arousal/Alertness: Awake/alert Behavior During Therapy: Flat affect Overall Cognitive Status: Impaired/Different from baseline Area of Impairment: Safety/judgement;Problem solving;Following commands;Memory                     Memory: Decreased recall of precautions Following Commands: Follows one step commands consistently Safety/Judgement: Decreased awareness of safety;Decreased awareness of deficits   Problem Solving: Slow processing;Requires verbal cues;Requires tactile cues General Comments: pt alert, with improved participation and engagement today. Pt with better adherence to cues/instructions given at this time, and even aware of being "out of it" yesterday.         General Comments General comments (skin integrity, edema, etc.): improvements in mobility, pt still agreeable to short stint rehab as he is unsure if 24/7 supervision can be arranged      Pertinent Vitals/Pain Pain Assessment: No/denies pain Pain Intervention(s): Monitored during session           PT Goals (current goals can now be found in the care plan section) Acute Rehab PT Goals Patient Stated Goal: go home and hurt less PT Goal Formulation: With patient Time For Goal Achievement: 04/16/20 Potential to Achieve Goals: Good Progress towards PT goals: Progressing toward goals    Frequency    Min 3X/week      PT Plan Current plan remains appropriate       AM-PAC PT "6 Clicks" Mobility   Outcome Measure  Help needed turning from your back to your side while in a flat bed without using bedrails?: A Little Help needed moving from lying on your back to sitting on the side of a flat bed without using bedrails?: A Little Help needed moving to and from a bed to a chair (including  a wheelchair)?: A Little Help needed standing up from a chair using your arms (e.g., wheelchair or bedside chair)?: A Little Help needed to walk in hospital room?: A Little Help needed climbing 3-5 steps with a railing? : A Lot 6 Click Score: 17    End of Session Equipment Utilized During Treatment: Gait belt Activity Tolerance: Patient tolerated treatment well Patient left: in chair;with call bell/phone within reach;with chair alarm set Nurse Communication: Mobility status PT Visit Diagnosis: Unsteadiness on feet (R26.81);History of falling (Z91.81);Other abnormalities of gait and mobility (R26.89)     Time: 0093-8182 PT Time Calculation (min) (ACUTE ONLY): 25 min  Charges:  $Gait Training: 23-37 mins                     Rolm Baptise, PT, DPT   Acute Rehabilitation Department Pager #: [phone]   Gaetana Michaelis 04/05/2020, 11:38 AM

## 2020-04-05 NOTE — Progress Notes (Signed)
Patient has been alert and oriented x 4 today. Pt is not voicing complaints. Ambulating in room and with PT as necessary with a steady gait. RN will continue to monitor.

## 2020-04-05 NOTE — Progress Notes (Addendum)
PROGRESS NOTE    Brent Griffin.  MC:[id number] DOB: [birth year] DOA: 04/01/2020 PCP: [name], PA-C   No chief complaint on file.  Brief Narrative:  Brent Griffin. is a 75 y.o. male with medical history significant of hypertension, hyperlipidemia, diabetes mellitus type 2, remote alcohol abuse, arthritis, and prostate cancer s/p radiation treatment presents after getting pulled down by his dog on 12/22. He was seen in the ED the following day found to have a communicated complex left elbow fracture with referred to orthopedics. Due to the holiday schedule he was admitted into the hospital for surgical correction on 12/28. He was admitted to Dr. Amedeo Plenty and underwent successful open reduction internal fixation olecranon osteotomy and repair reconstruction with ulnar nerve decompression and transposition of left elbow. Following the procedure patient was noted to be slowed in his responses. Wife notes normally patient is alert and oriented to person, place, and time although she makes it seems that he may have some early signs possibly of dementia. Currently, patient states that it is 38, that he is in [place] and not [facility], and is unable to tell me why and he needed to come into the hospital. Symptoms were initially thought possibly related to anesthesia, but has had significant time for that wear off. Patient has not been receiving a lot of pain medication. He complains only of mild nonproductive cough and still ha the Foley catheter in place.  Assessment & Plan:   Active Problems:   Closed fracture of left distal humerus   Acute metabolic encephalopathy   Surgery, elective  Patient improving from mental status standpoint, will sign off.  Please call back if any concerns or questions.  1. Closed fracture of the left distal humerus 2/2 fall: Patient status post ORIF 12/28 -Per Ortho  2. Acute metabolic encephalopathy: Patient normally is  alert and oriented x3, but since surgery has been more slowed responses. Postoperative/hospital delirium likely etiology.  He's got continued improvement today, A&Ox3, not quite to his baseline, but significant improvement per him and his wife.   1. Delirium precautions 2. MRI brain without acute infarct.  Atrophy and chronic microvascular ischemia.  Chronic microhemorrhage to L thalamus.  (discussed with neurology given microhemorrhage, ok to continue aspirin) -Check urinalysis - not concerning for UTI  -Check chest x-ray - no active disease -limiting opioid and muscle relaxants as tolerated  3. Elevated Temperature 1. No true temp to 100.4 -> trend seems to be improving 2. UA not concerning for infection.  CXR without active disease. 3. Per Dr. Vanetta Shawl note, incision clean and dry, no signs of infection  4. Follow blood cultures if true fever  5. Currently on ancef post op given swelling and hematoma  4. Hyponatremia/hypokalemia/hypocalcemia: 1. Replace and follow - improved 2. Follow albumin -> low, calcium corrects 3. Hyponatremia is mild, continue to monitor  4. He's on HCTZ, follow   4. Essential hypertension -would resume all home meds at discharge  5. Postoperative anemia: Hemoglobin went from 13.5 on day of admission down to 11.5 g/dL today with normal MCV and MCH. Vital signs otherwise noted to be stable -Continue to monitor intermittently            DVT prophylaxis: lovenox Code Status: full Family Communication: none at bedside Disposition:   Status is: Inpatient  Remains inpatient appropriate because:Inpatient level of care appropriate due to severity of illness   Dispo: per ortho  Consultants:   Ortho is primary  [place] consulting  Procedures:  open reduction internal fixation with Biomet titanium plate and screw construct left elbow comminuted complex intra-articular low T condylar fracture utilizing allograft bone graft.  We utilized parallel  plating for the fracture process.  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 04/01/20 1230 04/01/20 1448   04/02/20 0230  ceFAZolin (ANCEF) IVPB 1 g/50 mL premix        1 g 100 mL/hr over 30 Minutes Intravenous Every 8 hours 04/01/20 2018     04/01/20 2115  ceFAZolin (ANCEF) IVPB 1 g/50 mL premix        1 g 100 mL/hr over 30 Minutes Intravenous NOW 04/01/20 2018 04/01/20 2205      Subjective: A&Ox3 today, he needed some prompting for location, but quickly corrects himself No new complaints, doing well   Objective: Vitals:   04/04/20 1927 04/05/20 0426 04/05/20 0822 04/05/20 1440  BP: 112/85 113/65 112/66 92/66  Pulse: 60 73 (!) 54 79  Resp: 18 18 18 18   Temp: 98.7 F (37.1 C) 98.1 F (36.7 C) 98.6 F (37 C) 98.6 F (37 C)  TempSrc: Oral Oral Oral Oral  SpO2: 99% 96% 94% 100%  Weight:      Height:        Intake/Output Summary (Last 24 hours) at 04/05/2020 1626 Last data filed at 04/05/2020 1300 Gross per 24 hour  Intake 480 ml  Output -  Net 480 ml   Filed Weights   04/01/20 1204  Weight: 87.1 kg    Examination:  General: No acute distress. Cardiovascular: Heart sounds show a regular rate, and rhythm. Lungs: Clear to auscultation bilaterally Abdomen: Soft, nontender, nondistended  Neurological: Alert and oriented 3 (location wrong initially, but corrects). Moves all extremities 4. Cranial nerves II through XII grossly intact. Skin: Warm and dry. No rashes or lesions. Extremities: LUE in sling   Data Reviewed: I have personally reviewed  following labs and imaging studies  CBC: Recent Labs  Lab 04/01/20 1235 04/03/20 0223 04/04/20 1604 04/05/20 0303  WBC 5.0 8.1 6.7 4.8  NEUTROABS  --  6.4 5.0 3.3  HGB 13.5 11.8* 11.9* 11.5*  HCT 41.3 33.0* 33.6* 33.4*  MCV 93.2 88.5 90.1 92.3  PLT 161 159 172 [id number]    Basic Metabolic Panel: Recent Labs  Lab 04/01/20 1235 04/03/20 0223 04/04/20 0226 04/05/20 0303  NA 141 133* 134* 135  K 3.3* 3.2* 3.4* 3.7  CL 106 98 99 102  CO2 25 28 23 23   GLUCOSE 93 120* 107* 95  BUN 12 14 17 17   CREATININE 0.89 1.00 0.92 0.97  CALCIUM 8.7* 8.3* 8.5* 8.3*  MG  --   --  1.9 2.0  PHOS  --   --   --  2.7    GFR: Estimated Creatinine Clearance: 70 mL/min (by C-G formula based on SCr of 0.97 mg/dL).  Liver Function Tests: Recent Labs  Lab 04/04/20 1604 04/05/20 0303  AST  --  37  ALT  --  21  ALKPHOS  --  67  BILITOT  --  0.7  PROT  --  5.9*  ALBUMIN 2.6* 2.5*    CBG: Recent Labs  Lab 04/01/20 1207 04/01/20 1410 04/01/20 1959  GLUCAP 92 93 75     Recent Results (from the past 240 hour(s))  Resp Panel by RT-PCR (Flu A&B, Covid) Nasopharyngeal Swab     Status: None   Collection Time: 03/27/20  2:05 PM   Specimen: Nasopharyngeal Swab; Nasopharyngeal(NP) swabs in vial transport medium  Result Value Ref Range Status   SARS Coronavirus 2 by RT PCR NEGATIVE NEGATIVE Final    Comment: (NOTE) SARS-CoV-2 target nucleic acids are NOT DETECTED.  The SARS-CoV-2 RNA is generally detectable in upper respiratory specimens during the acute phase of infection. The lowest concentration of SARS-CoV-2 viral copies this assay can detect is 138 copies/mL. A negative result does not preclude SARS-Cov-2 infection and should not be used as the sole basis for treatment or other patient management decisions. A negative result may occur with  improper specimen collection/handling, submission of specimen other than nasopharyngeal swab, presence of viral mutation(s) within the areas  targeted by this assay, and inadequate number of viral copies(<138 copies/mL). A negative result must be combined with clinical observations, patient history, and epidemiological information. The expected result is Negative.  Fact Sheet for Patients:  BloggerCourse.com  Fact Sheet for Healthcare Providers:  SeriousBroker.it  This test is no t yet approved or cleared by the [place] FDA and  has been authorized for detection and/or diagnosis of SARS-CoV-2 by FDA under an Emergency Use Authorization (EUA). This EUA will remain  in effect (meaning this test can be used) for the duration of the COVID-19 declaration under Section 564(b)(1) of the Act, 21 U.S.C.section 360bbb-3(b)(1), unless the authorization is terminated  or revoked sooner.       Influenza A by PCR NEGATIVE NEGATIVE Final   Influenza B by PCR NEGATIVE NEGATIVE Final    Comment: (NOTE) The Xpert Xpress SARS-CoV-2/FLU/RSV plus assay is intended as an aid in the diagnosis of influenza from Nasopharyngeal swab specimens and should not be used as a sole basis for treatment. Nasal washings and aspirates are unacceptable for Xpert Xpress SARS-CoV-2/FLU/RSV testing.  Fact Sheet for Patients: BloggerCourse.com  Fact Sheet for Healthcare Providers: SeriousBroker.it  This test is not yet approved or cleared by the [place] FDA and has been authorized for detection and/or diagnosis of SARS-CoV-2 by FDA under an Emergency Use Authorization (EUA). This EUA will remain in effect (meaning this test can be used) for the duration of the COVID-19 declaration under Section 564(b)(1) of the Act, 21 U.S.C. section 360bbb-3(b)(1), unless the authorization is terminated or revoked.  Performed at [facility], [address] [place], [place] [address]   SARS CORONAVIRUS 2 (TAT 6-24 HRS) Nasopharyngeal  Nasopharyngeal Swab     Status: None   Collection Time: 03/31/20  1:56 PM   Specimen: Nasopharyngeal Swab  Result Value Ref Range Status   SARS Coronavirus 2 NEGATIVE NEGATIVE Final    Comment: (NOTE) SARS-CoV-2 target nucleic acids are NOT DETECTED.  The SARS-CoV-2 RNA is generally detectable in upper and lower respiratory specimens during the acute phase of infection. Negative results do not preclude SARS-CoV-2 infection, do not rule out co-infections with other pathogens, and should not be used as the sole basis for treatment or other patient management decisions. Negative results must be combined with clinical observations, patient history, and epidemiological information. The expected result  is Negative.  Fact Sheet for Patients: SugarRoll.be  Fact Sheet for Healthcare Providers: https://www.woods-mathews.com/  This test is not yet approved or cleared by the [place] FDA and  has been authorized for detection and/or diagnosis of SARS-CoV-2 by FDA under an Emergency Use Authorization (EUA). This EUA will remain  in effect (meaning this test can be used) for the duration of the COVID-19 declaration under Se ction 564(b)(1) of the Act, 21 U.S.C. section 360bbb-3(b)(1), unless the authorization is terminated or revoked sooner.  Performed at [facility], [place] [address] [place], [place] [address]   Surgical pcr screen     Status: Abnormal   Collection Time: 04/01/20  1:41 PM   Specimen: Nasal Mucosa; Nasal Swab  Result Value Ref Range Status   MRSA, PCR NEGATIVE NEGATIVE Final   Staphylococcus aureus POSITIVE (A) NEGATIVE Final    Comment: (NOTE) The Xpert SA Assay (FDA approved for NASAL specimens in patients 49 years of age and older), is one component of a comprehensive surveillance program. It is not intended to diagnose infection nor to guide or monitor treatment. Performed at [facility], [place] [address], [place], [place] [address]          Radiology Studies: MR BRAIN WO CONTRAST  Result Date: 04/03/2020 CLINICAL DATA:  Mental status change EXAM: MRI HEAD WITHOUT CONTRAST TECHNIQUE: Multiplanar, multiecho pulse sequences of the brain and surrounding structures were obtained without intravenous contrast. COMPARISON:  None. FINDINGS: Brain: Generalized atrophy. Negative for hydrocephalus. Mild to moderate white matter changes with periventricular and deep white matter hyperintensities bilaterally. Chronic microhemorrhage left thalamus. Negative for mass Negative for acute infarct.  No areas of restricted diffusion. Vascular: Normal arterial flow voids. Skull and upper cervical spine: Negative Sinuses/Orbits: Mucosal edema in the paranasal sinuses most notably in the left maxillary sinus. Negative orbit Other: None IMPRESSION: No acute infarct Atrophy and chronic microvascular ischemia. Chronic microhemorrhage left thalamus. Electronically Signed   By: Franchot Gallo M.D.   On: 04/03/2020 21:22   DG CHEST PORT 1 VIEW  Result Date: 04/03/2020 CLINICAL DATA:  Cough EXAM: PORTABLE CHEST 1 VIEW COMPARISON:  None. FINDINGS: The heart size and mediastinal contours are within normal limits. Both lungs are clear. The visualized skeletal structures are unremarkable. IMPRESSION: No active disease. Electronically Signed   By: Donavan Foil M.D.   On: 04/03/2020 18:49        Scheduled Meds: . vitamin C  1,000 mg Oral Daily  . Chlorhexidine Gluconate Cloth  6 each Topical Q0600  . docusate sodium  100 mg Oral BID  . enoxaparin (LOVENOX) injection  40 mg Subcutaneous Q24H  . hydrochlorothiazide  25 mg Oral Daily  . losartan  100 mg Oral Daily  . metoprolol succinate  150 mg Oral Daily  . mupirocin ointment  1 application Nasal BID  . potassium chloride SA  20 mEq Oral BID  . rosuvastatin  10 mg Oral QHS   Continuous Infusions: .  ceFAZolin (ANCEF) IV 1 g (04/05/20 1500)  . lactated ringers  Stopped (04/02/20 0308)     LOS: 2 days    Time spent: over 63 min    Fayrene Helper, MD [facility]   To contact the attending provider between 7A-7P or the covering provider during after hours 7P-7A, please log into the web site [email or web address] and access using universal [place] password for that web site. If you do not have the password, please call the hospital operator.  04/05/2020, 4:26 PM

## 2020-04-05 NOTE — TOC Progression Note (Signed)
Transition of Care (TOC) - Progression Note    Patient Details  Name: Brent Griffin. MRN: [id number] Date of Birth: [birth year]  [facility] of [facility]) CM/SW Contact  Levada Schilling Phone Number: 04/05/2020, 11:35 AM  Clinical Narrative:     CSW spoke with pt's spouse by phone to give update on possible accepting SNF's.  Pt's spouse will update CSW on choice for SNF.  TOC Team will continue to assist with disposition planning.  Expected Discharge Plan: Skilled Nursing Facility Barriers to Discharge: Continued Medical Work up,SNF Pending bed offer  Expected Discharge Plan and Services Expected Discharge Plan: Skilled Nursing Facility   Discharge Planning Services: CM Consult Post Acute Care Choice: Skilled Nursing Facility Living arrangements for the past 2 months: Single Family Home                 DME Arranged: Gilmer Mor DME Agency: [employer] Date DME Agency Contacted: 04/02/20 Time DME Agency Contacted: [phone] Representative spoke with at DME Agency: CM with [employer] HH Arranged: PT,OT HH Agency: [facility] Date [facility] Agency Contacted: 04/02/20 Time HH Agency Contacted: [phone] Representative spoke with at [facility] Agency: Kandee Keen, RNCM with [place]   Social Determinants of Health (SDOH) Interventions    Readmission Risk Interventions No flowsheet data found.

## 2020-04-05 NOTE — Progress Notes (Signed)
Patient ID: Brent Howes., male   DOB: 1945-05-29, 75 y.o.   MRN: [id number] Patient is seen at bedside.  Patient is alert and oriented.  Patient notes no significant pain.  We performed a comprehensive bandage/dressing change.  His incision is clean and dry there is no signs of infection.  There is no signs of vascular compromise.  His swelling is improving daily.  We will continue to recommend edema control range of motion and massage to the fingers.  We will continue therapeutic endeavors for walking and other issues.  His MRI was normal without signs of stroke or acute cerebrovascular event.  Once again, status post open reduction internal fixation complex elbow fracture dislocation with postop delirium which is resolving at this time at a nice pace.  We will plan for continued therapeutic endeavors to rehabilitate the patient and have discussed looking towards a skilled nursing facility so that we can make sure he is 100% prior to his return home.  All questions have been encouraged and answered. His labs today look good.  We will continue his Ancef while he is here given the significant swelling and hematoma noted pre and Intra-Op.  The drop in his hemoglobin is due to acute blood loss with surgical intervention.   Euna Armon MD

## 2020-04-05 NOTE — Plan of Care (Signed)
Patient continuously gets out of bed. Patient educated on calling before getting up, but patient continues to not use the call bell.   Problem: Education: Goal: Knowledge of General Education information will improve Description: Including pain rating scale, medication(s)/side effects and non-pharmacologic comfort measures Outcome: Progressing   Problem: Activity: Goal: Risk for activity intolerance will decrease Outcome: Progressing   Problem: Pain Managment: Goal: General experience of comfort will improve Outcome: Progressing   Problem: Safety: Goal: Ability to remain free from injury will improve Outcome: Progressing   Problem: Skin Integrity: Goal: Risk for impaired skin integrity will decrease Outcome: Progressing

## 2020-04-06 LAB — CBC WITH DIFFERENTIAL/PLATELET
Abs Immature Granulocytes: 0.01 10*3/uL (ref 0.00–0.07)
Basophils Absolute: 0 10*3/uL (ref 0.0–0.1)
Basophils Relative: 1 %
Eosinophils Absolute: 0.1 10*3/uL (ref 0.0–0.5)
Eosinophils Relative: 2 %
HCT: 33.1 % — ABNORMAL LOW (ref 39.0–52.0)
Hemoglobin: 11.6 g/dL — ABNORMAL LOW (ref 13.0–17.0)
Immature Granulocytes: 0 %
Lymphocytes Relative: 21 %
Lymphs Abs: 1 10*3/uL (ref 0.7–4.0)
MCH: 31.8 pg (ref 26.0–34.0)
MCHC: 35 g/dL (ref 30.0–36.0)
MCV: 90.7 fL (ref 80.0–100.0)
Monocytes Absolute: 0.8 10*3/uL (ref 0.1–1.0)
Monocytes Relative: 16 %
Neutro Abs: 2.9 10*3/uL (ref 1.7–7.7)
Neutrophils Relative %: 60 %
Platelets: 183 10*3/uL (ref 150–400)
RBC: 3.65 MIL/uL — ABNORMAL LOW (ref 4.22–5.81)
RDW: 14.6 % (ref 11.5–15.5)
WBC: 4.9 10*3/uL (ref 4.0–10.5)
nRBC: 0 % (ref 0.0–0.2)

## 2020-04-06 LAB — MAGNESIUM: Magnesium: 2 mg/dL (ref 1.7–2.4)

## 2020-04-06 LAB — COMPREHENSIVE METABOLIC PANEL
ALT: 33 U/L (ref 0–44)
AST: 50 U/L — ABNORMAL HIGH (ref 15–41)
Albumin: 2.5 g/dL — ABNORMAL LOW (ref 3.5–5.0)
Alkaline Phosphatase: 68 U/L (ref 38–126)
Anion gap: 10 (ref 5–15)
BUN: 24 mg/dL — ABNORMAL HIGH (ref 8–23)
CO2: 23 mmol/L (ref 22–32)
Calcium: 8.6 mg/dL — ABNORMAL LOW (ref 8.9–10.3)
Chloride: 105 mmol/L (ref 98–111)
Creatinine, Ser: 1.12 mg/dL (ref 0.61–1.24)
GFR, Estimated: >60 mL/min (ref >60)
Glucose, Bld: 94 mg/dL (ref 70–99)
Potassium: 3.5 mmol/L (ref 3.5–5.1)
Sodium: 138 mmol/L (ref 135–145)
Total Bilirubin: 0.9 mg/dL (ref 0.3–1.2)
Total Protein: 6.2 g/dL — ABNORMAL LOW (ref 6.5–8.1)

## 2020-04-06 LAB — PHOSPHORUS: Phosphorus: 2.9 mg/dL (ref 2.5–4.6)

## 2020-04-06 NOTE — TOC Progression Note (Signed)
Transition of Care (TOC) - Progression Note    Patient Details  Name: Brent Griffin. MRN: [id number] Date of Birth: December 24, 1945  Transition of Care [facility]) CM/SW Contact  Levada Schilling Phone Number: 04/06/2020, 1:27 PM  Clinical Narrative:    CSW spoke with pt's spouse by phone concerning SNF choice for pt.  Pt's spouse prefers [facility] ([facility]) and [facility]).  [facility] is conveniently located across the street from pt's home. TOC Team will continue to assist with disposition planning.    Expected Discharge Plan: Skilled Nursing Facility Barriers to Discharge: Continued Medical Work up,SNF Pending bed offer  Expected Discharge Plan and Services Expected Discharge Plan: Skilled Nursing Facility   Discharge Planning Services: CM Consult Post Acute Care Choice: Skilled Nursing Facility Living arrangements for the past 2 months: Single Family Home                 DME Arranged: Gilmer Mor DME Agency: [employer] Date DME Agency Contacted: 04/02/20 Time DME Agency Contacted: [phone] Representative spoke with at DME Agency: CM with [employer] HH Arranged: PT,OT HH Agency: [facility] Date [facility] Agency Contacted: 04/02/20 Time HH Agency Contacted: [phone] Representative spoke with at [facility] Agency: Kandee Keen, RNCM with [place]   Social Determinants of Health (SDOH) Interventions    Readmission Risk Interventions No flowsheet data found.

## 2020-04-06 NOTE — Plan of Care (Signed)

## 2020-04-06 NOTE — Progress Notes (Signed)
Patient ID: Bristol Soy., male   DOB: 11/28/1945, 75 y.o.   MRN: [id number] Patient seen at bedside.  Patient is doing much better in terms of his cognition.  He is making gains with therapy.  Medicine has signed off as they feel he is stable at this juncture.  I spoke with him yesterday.  He is comfortable.  Chest is clear abdomen is nontender and lower extremity examination is benign.  We will continue precautions for the left upper extremity and await bed placement.  All questions have been addressed.  Kemiya Batdorf MD

## 2020-04-07 LAB — SARS CORONAVIRUS 2 (TAT 6-24 HRS): SARS Coronavirus 2: NEGATIVE

## 2020-04-07 NOTE — Progress Notes (Signed)
Covid test in process when checked

## 2020-04-07 NOTE — Discharge Summary (Signed)
Physician Discharge Summary  Patient ID: Brent Griffin. MRN: [id number] DOB/AGE: 1945/07/14 75 y.o.  Admit date: 04/01/2020 Discharge date:   Admission Diagnoses: DISTAL HUMERUS FX LEFT ELBOW Past Medical History:  Diagnosis Date  . Arthritis    thumb right hand  . 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 Diabetes mellitus    type II, not taking medications - 03/31/20  . History of kidney stones    x 3  . Hyperlipidemia   . Hypertension   . Metatarsalgia    left foot  . 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 is an 75 y.o. male who was admitted 04/01/2020 with a chief complaint of No chief complaint on file. , and found to have a diagnosis of DISTAL HUMERUS FX LEFT ELBOW.  They were brought to the operating room on 04/01/2020 and underwent Procedure(s): OPEN REDUCTION INTERNAL FIXATION (ORIF) SUPRACONDYLAR ELBOW  FRACTURE, OLECRANON OSTEOTOMY AND BURSECTOMY ULNAR NERVE RELEASE AND TRANSPOSITION.    They were given perioperative antibiotics:  Anti-infectives (From admission, onward)   Start     Dose/Rate Route Frequency Ordered Stop   04/02/20 0600  ceFAZolin (ANCEF) IVPB 2g/100 mL premix        2 g 200 mL/hr over 30 Minutes Intravenous On call to O.R. 04/01/20 1230 04/01/20 1448   04/02/20 0230  ceFAZolin (ANCEF) IVPB 1 g/50 mL premix  Status:  Discontinued        1 g 100 mL/hr over 30 Minutes Intravenous Every 8 hours 04/01/20 2018 04/06/20 1146   04/01/20 2115  ceFAZolin (ANCEF) IVPB 1 g/50 mL  premix        1 g 100 mL/hr over 30 Minutes Intravenous NOW 04/01/20 2018 04/01/20 2205    .  They were given sequential compression devices, early ambulation, and   Recent vital signs:  Patient Vitals for the past 24 hrs:  BP Temp Temp src Pulse Resp SpO2  04/07/20 0945 133/87 -- -- 79 -- --  04/07/20 0752 133/87 98.5 F (36.9 C) Oral 79 18 100 %  04/07/20 0333 118/76 98.5 F (36.9 C) Oral 67 15 94 %  04/06/20 1926 121/69 98.1 F (36.7 C) Oral 63 16 --  04/06/20 1546 127/76 99 F (37.2 C) Oral 72 18 98 %  .  Recent laboratory studies: No results found.  Discharge Medications:   Allergies as of 04/07/2020   No Known Allergies     Medication List    TAKE these medications   acetaminophen 500 MG tablet Commonly known as: TYLENOL Take 1,000 mg by mouth every 6 (six) hours as needed for moderate pain or headache.   amLODipine 10 MG tablet Commonly known as: NORVASC Take 10 mg by mouth.   aspirin EC 81 MG tablet Take 81 mg by mouth daily. Swallow whole.   Cinnamon 500 MG capsule Take 500 mg by mouth daily.   doxycycline 100 MG tablet Commonly known as: VIBRA-TABS Take 100 mg by mouth daily.   folic acid 1 MG tablet Commonly known as: FOLVITE Take 1  mg by mouth daily.   hydrochlorothiazide 25 MG tablet Commonly known as: HYDRODIURIL Take 25 mg by mouth daily.   losartan 100 MG tablet Commonly known as: COZAAR Take 100 mg by mouth daily.   Melatonin 5 MG Caps Take 10-15 mg by mouth at bedtime as needed (sleep).   metoprolol succinate 100 MG 24 hr tablet Commonly known as: TOPROL-XL Take 150 mg by mouth at bedtime.   Multi-Vitamins Tabs Take 1 tablet by mouth daily.   naproxen 500 MG tablet Commonly known as: NAPROSYN Take 500 mg by mouth 2 (two) times daily as needed for moderate pain.   OMEGA 3 PO Take 2 capsules by mouth daily.   OneTouch Delica Lancets 33G Misc USE ONE LANCET DAILY TO TEST BLOOD GLUCOSE   OneTouch Verio test strip Generic  drug: glucose blood USE ONE STRIP DAILY TO TEST BLOOD GLUCOSE.   oxyCODONE-acetaminophen 5-325 MG tablet Commonly known as: PERCOCET/ROXICET Take 1 tablet by mouth every 4 (four) hours as needed for severe pain.   potassium chloride SA 20 MEQ tablet Commonly known as: KLOR-CON Take 20 mEq by mouth 2 (two) times daily.   rosuvastatin 10 MG tablet Commonly known as: CRESTOR Take 10 mg by mouth at bedtime.   SALONPAS PAIN RELIEF PATCH EX Apply 1-2 patches topically daily as needed (pain).   sodium chloride 0.65 % Soln nasal spray Commonly known as: OCEAN Place 1 spray into both nostrils as needed for congestion.   tiZANidine 4 MG tablet Commonly known as: ZANAFLEX Take 4 mg by mouth 2 (two) times daily as needed for muscle spasms.   TURMERIC PO Take 1 tablet by mouth daily.   vitamin B-12 1000 MCG tablet Commonly known as: CYANOCOBALAMIN Take 1,000 mcg by mouth daily.            Durable Medical Equipment  (From admission, onward)         Start     Ordered   04/02/20 1457  For home use only DME Cane  Once        04/02/20 1457          Diagnostic Studies: DG Elbow 2 Views Left  Result Date: 04/01/2020 CLINICAL DATA:  Elective surgery. EXAM: LEFT ELBOW - 2 VIEW; DG C-ARM 1-60 MIN COMPARISON:  Preoperative elbow CT 03/27/2020 FINDINGS: Eleven fluoroscopic spot images of the left elbow obtained in the operating room. Placement of medial and lateral plate and multi screw fixation of comminuted distal humerus fracture. Placement of screw in trapeze wire in the olecranon. Total fluoroscopy time 34 seconds. Total dose 1.43 mGy. IMPRESSION: Procedural fluoroscopy during ORIF distal humerus fracture and placement of hardware in the proximal ulna. Electronically Signed   By: Narda Rutherford M.D.   On: 04/01/2020 18:42   MR BRAIN WO CONTRAST  Result Date: 04/03/2020 CLINICAL DATA:  Mental status change EXAM: MRI HEAD WITHOUT CONTRAST TECHNIQUE: Multiplanar, multiecho pulse  sequences of the brain and surrounding structures were obtained without intravenous contrast. COMPARISON:  None. FINDINGS: Brain: Generalized atrophy. Negative for hydrocephalus. Mild to moderate white matter changes with periventricular and deep white matter hyperintensities bilaterally. Chronic microhemorrhage left thalamus. Negative for mass Negative for acute infarct.  No areas of restricted diffusion. Vascular: Normal arterial flow voids. Skull and upper cervical spine: Negative Sinuses/Orbits: Mucosal edema in the paranasal sinuses most notably in the left maxillary sinus. Negative orbit Other: None IMPRESSION: No acute infarct Atrophy and chronic microvascular ischemia. Chronic microhemorrhage left thalamus. Electronically Signed   By:  Franchot Gallo M.D.   On: 04/03/2020 21:22   CT Elbow Left Wo Contrast  Result Date: 03/27/2020 CLINICAL DATA:  Left elbow swelling and bruising after falling yesterday. Reported elbow fracture on outside radiographs. EXAM: CT OF THE UPPER LEFT EXTREMITY WITHOUT CONTRAST TECHNIQUE: Multidetector CT imaging of the left elbow was performed according to the standard protocol. COMPARISON:  None. FINDINGS: Bones/Joint/Cartilage There is a comminuted and moderately displaced intercondylar fracture of the distal humerus. The medial humeral epicondyle demonstrates up to 10 mm of anterior and medial displacement relative to the remainder of the humerus. This fracture extends into the central articular surface of the trochlea. No involvement of the articular surface of the capitellum identified. The radiocapitellar and ulnohumeral articulations are intact. There are probable intra-articular fracture fragments between the trochlea and olecranon process. There is spurring of the olecranon process without definite acute injury of the proximal ulna or radius. Moderate-sized elbow joint effusion noted. Ligaments Suboptimally assessed by CT. Muscles and Tendons The biceps and triceps  tendons appear intact. No focal muscular hematoma identified. Soft tissues Diffuse soft tissue swelling around the elbow, greatest posteriorly. No focal hematoma, foreign body or soft tissue emphysema. IMPRESSION: 1. Comminuted and moderately displaced intercondylar fracture of the distal humerus as described, involving the articular surface of the trochlea. The medial humeral epicondyle demonstrates up to 10 mm of anterior and medial displacement relative to the remainder of the humerus. 2. Probable intra-articular fracture fragments between the trochlea and olecranon process. Moderate-sized elbow joint effusion. 3. Diffuse soft tissue swelling around the elbow, greatest posteriorly. Electronically Signed   By: Richardean Sale M.D.   On: 03/27/2020 15:28   DG CHEST PORT 1 VIEW  Result Date: 04/03/2020 CLINICAL DATA:  Cough EXAM: PORTABLE CHEST 1 VIEW COMPARISON:  None. FINDINGS: The heart size and mediastinal contours are within normal limits. Both lungs are clear. The visualized skeletal structures are unremarkable. IMPRESSION: No active disease. Electronically Signed   By: Donavan Foil M.D.   On: 04/03/2020 18:49   DG C-Arm 1-60 Min  Result Date: 04/01/2020 CLINICAL DATA:  Elective surgery. EXAM: LEFT ELBOW - 2 VIEW; DG C-ARM 1-60 MIN COMPARISON:  Preoperative elbow CT 03/27/2020 FINDINGS: Eleven fluoroscopic spot images of the left elbow obtained in the operating room. Placement of medial and lateral plate and multi screw fixation of comminuted distal humerus fracture. Placement of screw in trapeze wire in the olecranon. Total fluoroscopy time 34 seconds. Total dose 1.43 mGy. IMPRESSION: Procedural fluoroscopy during ORIF distal humerus fracture and placement of hardware in the proximal ulna. Electronically Signed   By: Keith Rake M.D.   On: 04/01/2020 18:42    They benefited maximally from their hospital stay and there were no complications.     Disposition: Discharge disposition:  03-Skilled Nursing Facility      Discharge Instructions    Call MD / Call 911   Complete by: As directed    If you experience chest pain or shortness of breath, CALL 911 and be transported to the hospital emergency room.  If you develope a fever above 101 F, pus (white drainage) or increased drainage or redness at the wound, or calf pain, call your surgeon's office.   Constipation Prevention   Complete by: As directed    Drink plenty of fluids.  Prune juice may be helpful.  You may use a stool softener, such as Colace (over the counter) 100 mg twice a day.  Use MiraLax (over the counter) for constipation as  needed.   Diet - low sodium heart healthy   Complete by: As directed    Increase activity slowly as tolerated   Complete by: As directed       Contact information for follow-up providers    [facility], [facility] Follow up.   Why: [facility] will deliver a cane to your room prior to your discharge to home. Contact information: [address] [place] [address] [phone]        Care, [facility] Follow up.   Specialty: Home Health Services Why: Brent Griffin will be providing you with Pt/Ot services.  They will call you in the next 24-48 hours to set up your home health therapy times. Contact information: [address] [place] [address] [phone]        [name], PA-C. Schedule an appointment as soon as possible for a visit.   Specialty: Physician Assistant Why: Please follow up with your primary care physician in the next 7-10 days for a hospital follow up. Contact information: [address] [place] [address] [phone]        Roseanne Kaufman, MD Follow up in 10 day(s).   Specialty: Orthopedic Surgery Why: come to the office Jan 12th 2022 at 845 am Contact information: [address] [place] [address] [phone]            Contact information for after-discharge  care    Destination    [facility] .   Service: Skilled Nursing Contact information: [place] [phone]                 Status post reconstruction left elbow with postop delirium.  MRI scan of the brain was negative.  The patient has slowly improved and is stable.  We will transition to skilled nursing facility.  I feel he can discontinue the Lovenox as he is ambulatory at this point.  I would recommend RTC January 12 at 8:45 AM for my evaluation as scheduled in my office.  At present juncture no signs of DVT infection or vascular compromise.  He is stable.  I would recommend transition back to all of his usual medicines.  I am going to hold off on any pain medicine as he has been relatively pain-free and stable.  At this juncture our goal was to resume his usual and customary medicines at home and simply see me back as scheduled and outlined.  His postop course was significant for a postop confusion state which was worked up by medicine and he was found to be stable.  At this time he is awake alert and oriented and stable for transition to the nursing facility.  Signed: Satira Anis Tyvon Eggenberger III 04/07/2020, 11:32 AM

## 2020-04-07 NOTE — Progress Notes (Signed)
Report given to [place] at [facility].  A discharge packet will be printed and given to his wife to take there.  The patient's wife will transport him via private vehicle to [facility]

## 2020-04-07 NOTE — Progress Notes (Signed)
Pt was accompanied by RN downstairs. Pt was picked up by family. Dc papers were given and all pt belongings accounted for

## 2020-04-07 NOTE — Plan of Care (Signed)
  Problem: Activity: Goal: Ability to avoid complications of mobility impairment will improve Outcome: Completed/Met Goal: Ability to tolerate increased activity will improve Outcome: Completed/Met   Problem: Education: Goal: Verbalization of understanding the information provided will improve Outcome: Completed/Met   Problem: Coping: Goal: Level of anxiety will decrease Outcome: Completed/Met   Problem: Physical Regulation: Goal: Postoperative complications will be avoided or minimized Outcome: Completed/Met   Problem: Respiratory: Goal: Ability to maintain a clear airway will improve Outcome: Completed/Met   Problem: Pain Management: Goal: Pain level will decrease Outcome: Completed/Met   Problem: Skin Integrity: Goal: Signs of wound healing will improve Outcome: Completed/Met   Problem: Tissue Perfusion: Goal: Ability to maintain adequate tissue perfusion will improve Outcome: Completed/Met   Problem: Education: Goal: Knowledge of General Education information will improve Description: Including pain rating scale, medication(s)/side effects and non-pharmacologic comfort measures Outcome: Completed/Met   Problem: Health Behavior/Discharge Planning: Goal: Ability to manage health-related needs will improve Outcome: Completed/Met   Problem: Clinical Measurements: Goal: Ability to maintain clinical measurements within normal limits will improve Outcome: Completed/Met Goal: Will remain free from infection Outcome: Completed/Met Goal: Diagnostic test results will improve Outcome: Completed/Met Goal: Respiratory complications will improve Outcome: Completed/Met Goal: Cardiovascular complication will be avoided Outcome: Completed/Met   Problem: Activity: Goal: Risk for activity intolerance will decrease Outcome: Completed/Met   Problem: Nutrition: Goal: Adequate nutrition will be maintained Outcome: Completed/Met   Problem: Coping: Goal: Level of anxiety  will decrease Outcome: Completed/Met   Problem: Elimination: Goal: Will not experience complications related to bowel motility Outcome: Completed/Met Goal: Will not experience complications related to urinary retention Outcome: Completed/Met   Problem: Pain Managment: Goal: General experience of comfort will improve Outcome: Completed/Met   Problem: Safety: Goal: Ability to remain free from injury will improve Outcome: Completed/Met   Problem: Skin Integrity: Goal: Risk for impaired skin integrity will decrease Outcome: Completed/Met

## 2020-04-07 NOTE — Discharge Instructions (Signed)
Please remember to elevate your left upper extremity and move your fingers frequently with frequent massage.  Please do not put weight on your left elbow wrist or forearm.  Please come to the office of Dr. Amanda Pea at 845 January 12.  Please call for any problems

## 2020-04-07 NOTE — TOC Progression Note (Signed)
Transition of Care (TOC) - Progression Note    Patient Details  Name: Brent Griffin. MRN: [id number] Date of Birth: 11-Sep-1945  Transition of Care [facility]) CM/SW Contact  Mearl Latin, LCSW Phone Number: 04/07/2020, 11:12 AM  Clinical Narrative:    CSW made patient's spouse aware of bed offers and she is accepting of [facility]. [facility] is able to accept patient today pending a covid test and signed scripts. CSW sent message to MD (not able to get through on [facility] line).   Expected Discharge Plan: Skilled Nursing Facility Barriers to Discharge: Continued Medical Work up,SNF Pending bed offer  Expected Discharge Plan and Services Expected Discharge Plan: Skilled Nursing Facility   Discharge Planning Services: CM Consult Post Acute Care Choice: Skilled Nursing Facility Living arrangements for the past 2 months: Single Family Home                 DME Arranged: [facility] DME Agency: [facility] Date DME Agency Contacted: 04/02/20 Time DME Agency Contacted: [phone] Representative spoke with at DME Agency: CM with [facility] HH Arranged: PT,OT HH Agency: [facility] Date [facility] Agency Contacted: 04/02/20 Time HH Agency Contacted: 1459 Representative spoke with at [facility] Agency: Kandee Keen, RNCM with [place]   Social Determinants of Health (SDOH) Interventions    Readmission Risk Interventions No flowsheet data found.

## 2020-04-07 NOTE — TOC Progression Note (Signed)
Transition of Care (TOC) - Progression Note    Patient Details  Name: Edoardo Laforte. MRN: [id number] Date of Birth: 11/19/1945  Transition of Care [facility]) CM/SW Contact  Mearl Latin, LCSW Phone Number: 04/07/2020, 9:55 AM  Clinical Narrative:    [facility] has declined patient due to lack of beds. CSW awaiting bed availability from [facility].  Expected Discharge Plan: Skilled Nursing Facility Barriers to Discharge: Continued Medical Work up,SNF Pending bed offer  Expected Discharge Plan and Services Expected Discharge Plan: Skilled Nursing Facility   Discharge Planning Services: CM Consult Post Acute Care Choice: Skilled Nursing Facility Living arrangements for the past 2 months: Single Family Home                 DME Arranged: Gilmer Mor DME Agency: [facility] Date DME Agency Contacted: 04/02/20 Time DME Agency Contacted: [phone] Representative spoke with at DME Agency: CM with [facility] HH Arranged: PT,OT HH Agency: [facility] Date [facility] Agency Contacted: 04/02/20 Time HH Agency Contacted: 1459 Representative spoke with at [facility] Agency: Kandee Keen, RNCM with [place]   Social Determinants of Health (SDOH) Interventions    Readmission Risk Interventions No flowsheet data found.

## 2020-04-07 NOTE — Plan of Care (Signed)
  Problem: Activity: Goal: Ability to avoid complications of mobility impairment will improve Outcome: Progressing Goal: Ability to tolerate increased activity will improve Outcome: Progressing   Problem: Education: Goal: Verbalization of understanding the information provided will improve Outcome: Progressing   Problem: Coping: Goal: Level of anxiety will decrease Outcome: Progressing   Problem: Physical Regulation: Goal: Postoperative complications will be avoided or minimized Outcome: Progressing   Problem: Respiratory: Goal: Ability to maintain a clear airway will improve Outcome: Progressing   Problem: Pain Management: Goal: Pain level will decrease Outcome: Progressing   Problem: Skin Integrity: Goal: Signs of wound healing will improve Outcome: Progressing   Problem: Tissue Perfusion: Goal: Ability to maintain adequate tissue perfusion will improve Outcome: Progressing   Problem: Education: Goal: Knowledge of General Education information will improve Description: Including pain rating scale, medication(s)/side effects and non-pharmacologic comfort measures Outcome: Progressing   Problem: Health Behavior/Discharge Planning: Goal: Ability to manage health-related needs will improve Outcome: Progressing   Problem: Clinical Measurements: Goal: Ability to maintain clinical measurements within normal limits will improve Outcome: Progressing Goal: Will remain free from infection Outcome: Progressing Goal: Diagnostic test results will improve Outcome: Progressing Goal: Respiratory complications will improve Outcome: Progressing Goal: Cardiovascular complication will be avoided Outcome: Progressing   Problem: Activity: Goal: Risk for activity intolerance will decrease Outcome: Progressing   Problem: Nutrition: Goal: Adequate nutrition will be maintained Outcome: Progressing   Problem: Coping: Goal: Level of anxiety will decrease Outcome: Progressing    Problem: Elimination: Goal: Will not experience complications related to bowel motility Outcome: Progressing Goal: Will not experience complications related to urinary retention Outcome: Progressing   Problem: Pain Managment: Goal: General experience of comfort will improve Outcome: Progressing   Problem: Safety: Goal: Ability to remain free from injury will improve Outcome: Progressing   Problem: Skin Integrity: Goal: Risk for impaired skin integrity will decrease Outcome: Progressing   

## 2020-04-07 NOTE — Progress Notes (Signed)
I have called micro and cytology- lab person will call me back in 10 minutes.  I have informed the patient that we are still awaiting results of Covid test.

## 2020-04-07 NOTE — Progress Notes (Signed)
CSW notified patient's spouse that COVID resulted and she can come pick him up. Patient's spouse stated that her cousin is picking patient up and she will meet them at [facility]. RN aware.   Deeric Cruise LCSW

## 2020-04-07 NOTE — Progress Notes (Signed)
Physical Therapy Treatment Patient Details Name: Brent Griffin. 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 Agrees to PT session. Patient left hand swollen. Ice applied at end of session. Reviewed LUE WB precautions. Patient demonstrates understanding with transfers. He is able to stand from low couch with R UE assist and momentum. Ambulated 200 feet without AD and min guard. No overt lob or difficulty noted. Patient making good progress with mobility, ambulating in room independently. He will continue to benefit from skilled PT while here to improve functional mobility and safety.      Follow Up Recommendations  Home health PT;Supervision - Intermittent     Equipment Recommendations  None recommended by PT    Recommendations for Other Services       Precautions / Restrictions Precautions Precautions: Fall Precaution Comments: NWB on LUE Required Braces or Orthoses: Sling Restrictions Weight Bearing Restrictions: Yes LUE Weight Bearing: Non weight bearing Other Position/Activity Restrictions: pt agreeable to sling, needs assist to adjust to support L UE appropriately when standing    Mobility  Bed Mobility               General bed mobility comments: patient up siting on couch near window  Transfers Overall transfer level: Modified independent Equipment used: None Transfers: Sit to/from Stand           General transfer comment: able to stand with rocking and momentum with R UE assist from low couch  Ambulation/Gait Ambulation/Gait  assistance: Min guard Gait Distance (Feet): 200 Feet Assistive device: None Gait Pattern/deviations: Step-through pattern;Shuffle Gait velocity: WNL   General Gait Details: improved mobility this visit. ambulated without ad, min guard. No overt lob or difficulty noted. Patient reports slightly winded after ambulation.   Stairs             Wheelchair Mobility    Modified Rankin (Stroke Patients Only)       Balance Overall balance assessment: Modified Independent;Mild deficits observed, not formally tested Sitting-balance support: Feet supported;No upper extremity supported Sitting balance-Leahy Scale: Good     Standing balance support: No upper extremity supported;During functional activity Standing balance-Leahy Scale: Good Standing balance comment: patient ambulating in room without ad, ambulated 200 feet in hallways with min guard.                            Cognition Arousal/Alertness: Awake/alert Behavior During Therapy: WFL for tasks assessed/performed Overall Cognitive Status: Within Functional Limits for tasks assessed                       Memory: Decreased recall of precautions Following Commands: Follows one step commands consistently Safety/Judgement: Decreased awareness of deficits;Decreased awareness of safety Awareness: Intellectual   General Comments: patient with improved awareness and safety this session.      Exercises Other Exercises Other Exercises: left hand exercises- opening /closing due to hand and fingers swollen.    General Comments        Pertinent Vitals/Pain Pain Assessment: 0-10 Pain Score: 2  Pain Location: L arm  Pain Descriptors / Indicators: Discomfort Pain Intervention(s): Monitored during session;Ice applied    Home Living                      Prior Function            PT Goals (current goals can now be found in the care plan section) Acute Rehab PT Goals Patient Stated Goal: go  home and hurt less PT Goal Formulation: With patient Time For Goal Achievement: 04/16/20 Potential to Achieve Goals: Good Progress towards PT goals: Progressing toward goals    Frequency    Min 3X/week      PT Plan Discharge plan needs to be updated    Co-evaluation              AM-PAC PT "6 Clicks" Mobility   Outcome Measure  Help needed turning from your back to your side while in a flat bed without using bedrails?: None Help needed moving from lying on your back to sitting on the side of a flat bed without using bedrails?: None Help needed moving to and from a bed to a chair (including a wheelchair)?: None Help needed standing up from a chair using your arms (e.g., wheelchair or bedside chair)?: None Help needed to walk in hospital room?: A Little Help needed climbing 3-5 steps with a railing? : A Lot 6 Click Score: 21    End of Session Equipment Utilized During Treatment: Gait belt Activity Tolerance: Patient tolerated treatment well Patient left: in chair;with call bell/phone within reach Nurse Communication: Mobility status PT Visit Diagnosis: Muscle weakness (generalized) (M62.81)     Time: 9381-8299 PT Time Calculation (min) (ACUTE ONLY): 13 min  Charges:  $Gait Training: 8-22 mins                     Gerome Kokesh, PT, GCS 04/07/20,11:30 AM

## 2020-04-07 NOTE — TOC Transition Note (Signed)
Transition of Care [facility]) - CM/SW Discharge Note   Patient Details  Name: Brent Griffin. MRN: [id number] Date of Birth: 01/26/1946  [facility] CM/SW Contact:  Mearl Latin, LCSW Phone Number: 04/07/2020, 3:19 PM   Clinical Narrative:    Patient will DC to: [place] Anticipated DC date: 04/08/19 Family notified: Spouse Transport by: Spouse to be called once COVID test results and she will pick patient up to go to [place].    Per MD patient ready for DC to [facility]. RN to call report prior to discharge [phone] Room 1201P). RN, patient, patient's family, and facility notified of DC. Discharge Summary and FL2 sent to facility. DC packet on chart. Ambulance transport requested for patient.   CSW will sign off for now as social work intervention is no longer needed. Please consult [place] again if new needs arise.      Final next level of care: Skilled Nursing Facility Barriers to Discharge: No Barriers Identified   Patient Goals and CMS Choice Patient states their goals for this hospitalization and ongoing recovery are:: Pt plans to go to SNF, in order to return home safely. CMS Medicare.gov Compare Post Acute Care list provided to:: Patient Choice offered to / list presented to : Patient  Discharge Placement   Existing PASRR number confirmed : 04/07/20          Patient [facility] bed at: [facility] Patient to be transferred to facility by: Car Name of family member notified: Spouse Patient and family notified of of transfer: 04/07/20  Discharge Plan and Services   Discharge Planning Services: CM Consult Post Acute Care Choice: Skilled Nursing Facility          DME Arranged: [place] DME Agency: [employer] Date DME Agency Contacted: 04/02/20 Time DME Agency Contacted: [phone] Representative spoke with at DME Agency: CM with [employer] HH Arranged: PT,OT HH Agency: [facility] Date [facility] Agency Contacted: 04/02/20 Time HH Agency Contacted:  [phone] Representative spoke with at [facility] Agency: Kandee Keen, RNCM with [place]  Social Determinants of Health (SDOH) Interventions     Readmission Risk Interventions No flowsheet data found.

## 2020-05-23 ENCOUNTER — Encounter: Payer: Self-pay | Admitting: Gastroenterology

## 2020-06-06 ENCOUNTER — Encounter: Payer: Self-pay | Admitting: Gastroenterology

## 2020-06-06 ENCOUNTER — Ambulatory Visit (INDEPENDENT_AMBULATORY_CARE_PROVIDER_SITE_OTHER): Payer: Medicare Other | Admitting: Gastroenterology

## 2020-06-06 VITALS — BP 138/84 | HR 57 | Ht 70.0 in | Wt 187.0 lb

## 2020-06-06 DIAGNOSIS — R197 Diarrhea, unspecified: Secondary | ICD-10-CM

## 2020-06-06 DIAGNOSIS — R194 Change in bowel habit: Secondary | ICD-10-CM | POA: Insufficient documentation

## 2020-06-06 NOTE — Progress Notes (Signed)
06/06/2020 Brent Griffin [id number] 03/11/1946   HISTORY OF PRESENT ILLNESS: This is a 74 year old male who was previously a patient of Dr. Nichola Sizer.  His last colonoscopy was in July 2013 at which time he only had diverticulosis and internal hemorrhoids so it was recommended he have a repeat colonoscopy at 10-year interval.  He presents here today with complaints of change in bowel habits and loose stools since December when he had to have elbow surgery.  He had a fall and had to have extensive reconstruction of his elbow.  He tells me that he has had this loose stool/diarrhea since then.  He realized that they were giving him a stool softener so he discontinued that and it did improve, but did not resolve.  He denies any rectal bleeding or abdominal pain.  No nausea or vomiting.  Recent CBC shows a mildly low hemoglobin at 13.4 g.  MCV and iron studies are normal.  CMP unremarkable.  He had GI pathogen stool study performed and that was all negative for infectious source.  He says that he has been taking an Imodium every evening and that has seemed to help as well.   Past Medical History:  Diagnosis Date  . Arthritis    thumb right hand  . 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 Diabetes mellitus    type II, not taking medications - 03/31/20  . History of kidney stones    x 3  . Hyperlipidemia   . Hypertension   . Metatarsalgia    left foot  . Substance abuse ([place]) [id number]   former alcoholic    Past Surgical History:  Procedure Laterality Date  . COLONOSCOPY    . FOOT FRACTURE SURGERY Bilateral   . FOOT SURGERY Left 12/2018  . LITHOTRIPSY    . MOUTH SURGERY  1990  . ORIF ELBOW FRACTURE Left 04/01/2020   Procedure: OPEN REDUCTION INTERNAL FIXATION (ORIF) SUPRACONDYLAR ELBOW  FRACTURE, OLECRANON OSTEOTOMY AND BURSECTOMY;  Surgeon: Roseanne Kaufman, MD;  Location: [place];  Service: Orthopedics;  Laterality: Left;  . ROTATOR CUFF REPAIR Right 2018  . ULNAR NERVE  TRANSPOSITION Left 04/01/2020   Procedure: ULNAR NERVE RELEASE AND TRANSPOSITION;  Surgeon: Roseanne Kaufman, MD;  Location: [place];  Service: Orthopedics;  Laterality: Left;  Marland Kitchen VASECTOMY  1984    reports that he has been smoking cigarettes. He has smoked for the past 30.00 years. He has never used smokeless tobacco. He reports that he does not drink alcohol and does not use drugs. family history includes Colon cancer (age of onset: 6) in an other family member; Diabetes Mellitus II in his father; Prostate cancer in his father. No Known Allergies    Outpatient Encounter Medications as of 06/06/2020  Medication Sig  . acetaminophen (TYLENOL) 500 MG tablet Take 1,000 mg by mouth every 6 (six) hours as needed for moderate pain or headache.  Marland Kitchen amLODipine (NORVASC) 10 MG tablet Take 10 mg by mouth.  Marland Kitchen aspirin EC 81 MG tablet Take 81 mg by mouth daily. Swallow whole.  . Cinnamon 500 MG capsule Take 500 mg by mouth daily.  Marland Kitchen doxycycline (VIBRA-TABS) 100 MG tablet Take 100 mg by mouth daily.  . folic acid (FOLVITE) 1 MG tablet Take 1 mg by mouth daily.  . hydrochlorothiazide (HYDRODIURIL) 25 MG tablet Take 25 mg by mouth daily.  Marland Kitchen losartan (COZAAR) 100 MG tablet Take 100 mg by mouth daily.  . metoprolol succinate (TOPROL-XL) 100  MG 24 hr tablet Take 150 mg by mouth at bedtime.  . Multiple Vitamin (MULTI-VITAMINS) TABS Take 1 tablet by mouth daily.  . naproxen (NAPROSYN) 500 MG tablet Take 500 mg by mouth 2 (two) times daily as needed for moderate pain.  . Omega-3 Fatty Acids (OMEGA 3 PO) Take 2 capsules by mouth daily.  Glory Rosebush DELICA LANCETS 96G MISC USE ONE LANCET DAILY TO TEST BLOOD GLUCOSE  . ONETOUCH VERIO test strip USE ONE STRIP DAILY TO TEST BLOOD GLUCOSE.  . potassium chloride SA (KLOR-CON) 20 MEQ tablet Take 20 mEq by mouth 2 (two) times daily.  . rosuvastatin (CRESTOR) 10 MG tablet Take 10 mg by mouth at bedtime.  . TURMERIC PO Take 1 tablet by mouth daily.  . vitamin B-12  (CYANOCOBALAMIN) 1000 MCG tablet Take 1,000 mcg by mouth daily.  . vitamin C (ASCORBIC ACID) 250 MG tablet Take 250 mg by mouth daily.  . [DISCONTINUED] Melatonin 5 MG CAPS Take 10-15 mg by mouth at bedtime as needed (sleep).  . [DISCONTINUED] Menthol-Methyl Salicylate (SALONPAS PAIN RELIEF PATCH EX) Apply 1-2 patches topically daily as needed (pain).  . [DISCONTINUED] oxyCODONE-acetaminophen (PERCOCET/ROXICET) 5-325 MG tablet Take 1 tablet by mouth every 4 (four) hours as needed for severe pain.  . [DISCONTINUED] sodium chloride (OCEAN) 0.65 % SOLN nasal spray Place 1 spray into both nostrils as needed for congestion.  . [DISCONTINUED] tiZANidine (ZANAFLEX) 4 MG tablet Take 4 mg by mouth 2 (two) times daily as needed for muscle spasms.   No facility-administered encounter medications on file as of 06/06/2020.     REVIEW OF SYSTEMS  : All other systems reviewed and negative except where noted in the History of Present Illness.   PHYSICAL EXAM: BP 138/84   Pulse (!) 57   Ht 5\' 10"  (1.778 m)   Wt 187 lb (84.8 kg)   SpO2 96%   BMI 26.83 kg/m  General: Well developed white male in no acute distress Head: Normocephalic and atraumatic Eyes:  Sclerae anicteric, conjunctiva pink. Ears: Normal auditory acuity Lungs: Clear throughout to auscultation; no W/R/R. Heart: Slightly tachy but regular rhythm; no M/R/G. Abdomen: Soft, non-distended.  BS present.  Non-tender. Musculoskeletal: Symmetrical with no gross deformities.  Left are in sling/splint. Skin: No lesions on visible extremities Extremities: No edema  Neurological: Alert oriented x 4, grossly non-focal Psychological:  Alert and cooperative. Normal mood and affect  ASSESSMENT AND PLAN: *Change in bowel habits with loose stools/diarrhea: This has been an issue since his elbow surgery in December.  They had been giving him Colace and he discontinued that and the diarrhea/loose stools did improve, but did not resolve.  He is taking  Imodium each evening and that does seem to help.  Stool studies for infectious sources were negative.  He is due for colonoscopy in July 2023.  He does not want to have colonoscopy right now unless absolutely necessary as he still has a full arm sling/splint in place for at least another 4 weeks.  I recommended that maybe he start a daily probiotic such as align or Florastor.  Can certainly continue Imodium prn.  I am hoping that this will just continue to work itself out for him, but if not then he may need to consider colonoscopy when he is more comfortable and willing.    CC:  Keane Scrape*

## 2020-06-06 NOTE — Patient Instructions (Signed)
If you are age 74 or older, your body mass index should be between 23-30. Your Body mass index is 26.83 kg/m. If this is out of the aforementioned range listed, please consider follow up with your Primary Care Provider.  If you are age 52 or younger, your body mass index should be between 19-25. Your Body mass index is 26.83 kg/m. If this is out of the aformentioned range listed, please consider follow up with your Primary Care Provider.   Start daily probiotic such as Clinical cytogeneticist.  Start Imodium as needed.  Follow up for colonoscopy if no improvement.

## 2020-06-07 NOTE — Progress Notes (Signed)
Attending Physician's Attestation   I have reviewed the chart.   I agree with the Advanced Practitioner's note, impression, and recommendations with any updates as below. May consider bulking with fiber as well (Metamucil daily vs Fibercon daily).   Justice Britain, MD [facility] Advanced Endoscopy Office # [phone]

## 2020-07-10 ENCOUNTER — Other Ambulatory Visit: Payer: Self-pay

## 2020-07-10 ENCOUNTER — Encounter: Payer: Self-pay | Admitting: Gastroenterology

## 2020-07-10 ENCOUNTER — Ambulatory Visit (INDEPENDENT_AMBULATORY_CARE_PROVIDER_SITE_OTHER): Payer: Medicare Other | Admitting: Gastroenterology

## 2020-07-10 ENCOUNTER — Other Ambulatory Visit: Payer: Medicare Other

## 2020-07-10 VITALS — BP 138/82 | HR 72 | Ht 70.0 in | Wt 184.8 lb

## 2020-07-10 DIAGNOSIS — R197 Diarrhea, unspecified: Secondary | ICD-10-CM

## 2020-07-10 NOTE — Progress Notes (Signed)
Attending Physician's Attestation   I have reviewed the chart.   I agree with the Advanced Practitioner's note, impression, and recommendations with any updates as below.    Gwin Eagon Mansouraty, MD [facility] Advanced Endoscopy Office # [phone]  

## 2020-07-10 NOTE — Progress Notes (Signed)
07/10/2020 Brent Griffin [id number] [birth year]   HISTORY OF PRESENT ILLNESS: This is a pleasant 75 year old male who is here for follow-up with his diarrhea.  I saw him on March 4 at which time he reported that he had been having loose stools since summer when he had elbow surgery.  He had stool studies performed and they were negative for infectious source.  He had been using Imodium at night and that was helping his symptoms.  He has not been able to have colonoscopy due to being in a splint/sling with his elbow surgery.  He continues to be in that, and he anticipates having to be in it until May.  He still takes Imodium every night.  Is using align probiotic daily.  He has been experiencing about 1 episode of diarrhea weekly.  For the past 2 weeks it was consistently after he had eaten [place] chicken rice soup with a grilled cheese sandwich.  He says that when the episodes occur it is very urgent and he gets severe abdominal cramping.  He said that he will have one episode of diarrhea and then about 30 minutes later have very small episode and then it resolves.  He denies seeing blood in his stools.  He denies any abdominal pain between these episodes.  His last colonoscopy was in July 2013 at which time he was found to have diverticulosis and internal hemorrhoids and was told to have a repeat in 10 years.   Past Medical History:  Diagnosis Date  . Arthritis    thumb right hand  . 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 Diabetes mellitus    type II, not taking medications - 03/31/20  . History of kidney stones    x 3  . Hyperlipidemia   . Hypertension   . Metatarsalgia    left foot  . Substance abuse ([place]) [id number]   former alcoholic    Past Surgical History:  Procedure Laterality Date  . COLONOSCOPY    . FOOT FRACTURE SURGERY Bilateral   . FOOT SURGERY Left 12/2018  . LITHOTRIPSY    . MOUTH SURGERY  1990  . ORIF ELBOW FRACTURE Left 04/01/2020   Procedure:  OPEN REDUCTION INTERNAL FIXATION (ORIF) SUPRACONDYLAR ELBOW  FRACTURE, OLECRANON OSTEOTOMY AND BURSECTOMY;  Surgeon: Roseanne Kaufman, MD;  Location: [place];  Service: Orthopedics;  Laterality: Left;  . ROTATOR CUFF REPAIR Right 2018  . ULNAR NERVE TRANSPOSITION Left 04/01/2020   Procedure: ULNAR NERVE RELEASE AND TRANSPOSITION;  Surgeon: Roseanne Kaufman, MD;  Location: [place];  Service: Orthopedics;  Laterality: Left;  Marland Kitchen VASECTOMY  1984    reports that he has been smoking cigarettes. He has smoked for the past 30.00 years. He has never used smokeless tobacco. He reports that he does not drink alcohol and does not use drugs. family history includes Colon cancer (age of onset: 49) in an other family member; Diabetes Mellitus II in his father; Prostate cancer in his father. No Known Allergies    Outpatient Encounter Medications as of 07/10/2020  Medication Sig  . acetaminophen (TYLENOL) 500 MG tablet Take 1,000 mg by mouth every 6 (six) hours as needed for moderate pain or headache.  Marland Kitchen amLODipine (NORVASC) 10 MG tablet Take 10 mg by mouth daily.  Marland Kitchen aspirin EC 81 MG tablet Take 81 mg by mouth daily. Swallow whole.  . Cinnamon 500 MG capsule Take 500 mg by mouth daily.  Marland Kitchen doxycycline (VIBRA-TABS) 100 MG tablet  Take 100 mg by mouth daily.  . folic acid (FOLVITE) 1 MG tablet Take 1 mg by mouth daily.  . hydrochlorothiazide (HYDRODIURIL) 25 MG tablet Take 25 mg by mouth daily.  Marland Kitchen loperamide (IMODIUM) 2 MG capsule Take 2 mg by mouth every evening.  Marland Kitchen losartan (COZAAR) 100 MG tablet Take 100 mg by mouth daily.  . metoprolol succinate (TOPROL-XL) 100 MG 24 hr tablet Take 150 mg by mouth at bedtime.  . Multiple Vitamin (MULTI-VITAMINS) TABS Take 1 tablet by mouth daily.  . Multiple Vitamins-Minerals (ZINC PO) Take 1 tablet by mouth daily.  . naproxen (NAPROSYN) 500 MG tablet Take 500 mg by mouth 2 (two) times daily as needed for moderate pain.  . Omega-3 Fatty Acids (OMEGA 3 PO) Take 2 capsules by mouth  daily.  Glory Rosebush DELICA LANCETS 74Q MISC USE ONE LANCET DAILY TO TEST BLOOD GLUCOSE  . ONETOUCH VERIO test strip USE ONE STRIP DAILY TO TEST BLOOD GLUCOSE.  . potassium chloride SA (KLOR-CON) 20 MEQ tablet Take 20 mEq by mouth 2 (two) times daily.  . Probiotic Product (ALIGN PO) Take 1 tablet by mouth every evening.  . rosuvastatin (CRESTOR) 10 MG tablet Take 10 mg by mouth at bedtime.  . TURMERIC PO Take 1 tablet by mouth daily.  . vitamin B-12 (CYANOCOBALAMIN) 1000 MCG tablet Take 1,000 mcg by mouth daily.  . vitamin C (ASCORBIC ACID) 250 MG tablet Take 250 mg by mouth daily.   No facility-administered encounter medications on file as of 07/10/2020.     REVIEW OF SYSTEMS  : All other systems reviewed and negative except where noted in the History of Present Illness.   PHYSICAL EXAM: BP 138/82   Pulse 72   Ht 5\' 10"  (1.778 m)   Wt 184 lb 12.8 oz (83.8 kg)   SpO2 97%   BMI 26.52 kg/m  General: Well developed white male in no acute distress Head: Normocephalic and atraumatic Eyes:  Sclerae anicteric, conjunctiva pink. Ears: Normal auditory acuity Lungs: Clear throughout to auscultation; no W/R/R. Heart: Regular rate and rhythm; no M/R/G. Abdomen: Soft, non-distended.  BS present.  Non-tender. Musculoskeletal: Symmetrical with no gross deformities  Skin: No lesions on visible extremities Extremities: No edema  Neurological: Alert oriented x 4, grossly non-focal Psychological:  Alert and cooperative. Normal mood and affect  ASSESSMENT AND PLAN: *Change in bowel habits with loose stool/diarrhea with associated urgency and episodes of incontinence: As of late this is been very episodic, occurring about once a week.  Still using Imodium daily at nighttime, however.  This does obviously seem to be helping to control symptoms some.  He has also been taking align probiotic.  He still is unable to proceed with colonoscopy as he is still in a splint/sling from his elbow surgery and  anticipates having that on until sometime in May.  We will check celiac labs.  He seems to have had symptoms with the same type of foods consistently and will try to avoid those trigger foods.  I recommended that he begin a daily fiber supplement such as Benefiber powder starting with 2 teaspoons mixed in 8 ounces of liquid daily and increase to twice daily after a week or 2 if needed to help create bulk to the stool.  Otherwise he is going to follow-up here in May when he will be able to proceed with colonoscopy.   CC:  Keane Scrape*

## 2020-07-10 NOTE — Patient Instructions (Addendum)
If you are age 74 or older, your body mass index should be between 23-30. Your Body mass index is 26.52 kg/m. If this is out of the aforementioned range listed, please consider follow up with your Primary Care Provider.  If you are age 98 or younger, your body mass index should be between 19-25. Your Body mass index is 26.52 kg/m. If this is out of the aformentioned range listed, please consider follow up with your Primary Care Provider.   Your provider has requested that you go to the basement level for lab work before leaving today. Press "B" on the elevator. The lab is located at the first door on the left as you exit the elevator.  Due to recent changes in healthcare laws, you may see the results of your imaging and laboratory studies on MyChart before your provider has had a chance to review them.  We understand that in some cases there may be results that are confusing or concerning to you. Not all laboratory results come back in the same time frame and the provider may be waiting for multiple results in order to interpret others.  Please give [place] 48 hours in order for your provider to thoroughly review all the results before contacting the office for clarification of your results.   Avoid trigger foods.   Start Benefiber 2 teaspoons in 8oz of liquid daily, increase to twice a day after 1-2 weeks if needed.   Call after May 2022 to discuss setting up a colonoscopy.  Thank you for choosing me and [facility].  Alonza Bogus, PA- C

## 2020-07-11 LAB — TISSUE TRANSGLUTAMINASE, IGA: (tTG) Ab, IgA: <1 U/mL

## 2020-07-11 LAB — IGA: Immunoglobulin A: 411 mg/dL — ABNORMAL HIGH (ref 70–320)

## 2021-04-19 ENCOUNTER — Encounter (HOSPITAL_BASED_OUTPATIENT_CLINIC_OR_DEPARTMENT_OTHER): Payer: Self-pay | Admitting: Emergency Medicine

## 2021-04-19 ENCOUNTER — Emergency Department (HOSPITAL_BASED_OUTPATIENT_CLINIC_OR_DEPARTMENT_OTHER)
Admission: EM | Admit: 2021-04-19 | Discharge: 2021-04-19 | Disposition: A | Discharge status: Home / Self Care | Payer: Medicare Other | Attending: Emergency Medicine | Admitting: Emergency Medicine

## 2021-04-19 ENCOUNTER — Emergency Department (HOSPITAL_BASED_OUTPATIENT_CLINIC_OR_DEPARTMENT_OTHER): Payer: Medicare Other

## 2021-04-19 DIAGNOSIS — Z7982 Long term (current) use of aspirin: Secondary | ICD-10-CM | POA: Insufficient documentation

## 2021-04-19 DIAGNOSIS — W19XXXA Unspecified fall, initial encounter: Secondary | ICD-10-CM

## 2021-04-19 DIAGNOSIS — W010XXA Fall on same level from slipping, tripping and stumbling without subsequent striking against object, initial encounter: Secondary | ICD-10-CM | POA: Diagnosis not present

## 2021-04-19 DIAGNOSIS — Z79899 Other long term (current) drug therapy: Secondary | ICD-10-CM | POA: Insufficient documentation

## 2021-04-19 DIAGNOSIS — M25551 Pain in right hip: Secondary | ICD-10-CM | POA: Insufficient documentation

## 2021-04-19 DIAGNOSIS — M25511 Pain in right shoulder: Secondary | ICD-10-CM | POA: Diagnosis not present

## 2021-04-19 LAB — CBC WITH DIFFERENTIAL/PLATELET
Abs Immature Granulocytes: 0.03 10*3/uL (ref 0.00–0.07)
Basophils Absolute: 0 10*3/uL (ref 0.0–0.1)
Basophils Relative: 0 %
Eosinophils Absolute: 0 10*3/uL (ref 0.0–0.5)
Eosinophils Relative: 0 %
HCT: 42.5 % (ref 39.0–52.0)
Hemoglobin: 14.2 g/dL (ref 13.0–17.0)
Immature Granulocytes: 0 %
Lymphocytes Relative: 8 %
Lymphs Abs: 0.6 10*3/uL — ABNORMAL LOW (ref 0.7–4.0)
MCH: 29.5 pg (ref 26.0–34.0)
MCHC: 33.4 g/dL (ref 30.0–36.0)
MCV: 88.4 fL (ref 80.0–100.0)
Monocytes Absolute: 1.1 10*3/uL — ABNORMAL HIGH (ref 0.1–1.0)
Monocytes Relative: 13 %
Neutro Abs: 6.4 10*3/uL (ref 1.7–7.7)
Neutrophils Relative %: 79 %
Platelets: 223 10*3/uL (ref 150–400)
RBC: 4.81 MIL/uL (ref 4.22–5.81)
RDW: 17.2 % — ABNORMAL HIGH (ref 11.5–15.5)
WBC: 8.1 10*3/uL (ref 4.0–10.5)
nRBC: 0 % (ref 0.0–0.2)

## 2021-04-19 LAB — BASIC METABOLIC PANEL
Anion gap: 10 (ref 5–15)
BUN: 14 mg/dL (ref 8–23)
CO2: 26 mmol/L (ref 22–32)
Calcium: 9.1 mg/dL (ref 8.9–10.3)
Chloride: 101 mmol/L (ref 98–111)
Creatinine, Ser: 0.7 mg/dL (ref 0.61–1.24)
GFR, Estimated: >60 mL/min (ref >60)
Glucose, Bld: 107 mg/dL — ABNORMAL HIGH (ref 70–99)
Potassium: 3.9 mmol/L (ref 3.5–5.1)
Sodium: 137 mmol/L (ref 135–145)

## 2021-04-19 LAB — CK: Total CK: 401 U/L — ABNORMAL HIGH (ref 49–397)

## 2021-04-19 NOTE — ED Provider Notes (Signed)
[place] EMERGENCY DEPARTMENT Provider Note   CSN: [id number] Arrival date & time: 04/19/21  1005     History  Chief Complaint  Patient presents with   Fall   Hip Pain   Shoulder Pain    Pierson Vantol. is a 76 y.o. male here for evaluation after trip and fall at approximately midnight.  States he got up tripped subsequently falling on his right side.  He denies hitting his head, LOC or anticoagulation.  No midline back pain.  Patient states he had some pain to his hip so he slept on the floor all night in his sunroom.  He lives with his wife however wife was out of town.  Does not use any assistive devices for ambulation at baseline.  He denies any paresthesias, weakness.  No syncope, headache, chest pain, shortness of breath, paresthesias, weakness, abdominal pain, bowel or bladder incontinence.  Does not want anything for pain at this time.   On Plavix   HPI     Home Medications Prior to Admission medications   Medication Sig Start Date End Date Taking? Authorizing Provider  acetaminophen (TYLENOL) 500 MG tablet Take 1,000 mg by mouth every 6 (six) hours as needed for moderate pain or headache.    [provider]  amLODipine (NORVASC) 10 MG tablet Take 10 mg by mouth daily. 01/19/16   [provider]  aspirin EC 81 MG tablet Take 81 mg by mouth daily. Swallow whole.    [provider]  Cinnamon 500 MG capsule Take 500 mg by mouth daily.    [provider]  doxycycline (VIBRA-TABS) 100 MG tablet Take 100 mg by mouth daily. 01/19/16   [provider]  folic acid (FOLVITE) 1 MG tablet Take 1 mg by mouth daily.    [provider]  hydrochlorothiazide (HYDRODIURIL) 25 MG tablet Take 25 mg by mouth daily. 02/28/16   [provider]  loperamide (IMODIUM) 2 MG capsule Take 2 mg by mouth every evening.    [provider]  losartan (COZAAR) 100 MG tablet Take 100 mg by mouth daily. 04/20/16    [provider]  metoprolol succinate (TOPROL-XL) 100 MG 24 hr tablet Take 150 mg by mouth at bedtime. 05/18/16   [provider]  Multiple Vitamin (MULTI-VITAMINS) TABS Take 1 tablet by mouth daily.    [provider]  Multiple Vitamins-Minerals (ZINC PO) Take 1 tablet by mouth daily.    [provider]  naproxen (NAPROSYN) 500 MG tablet Take 500 mg by mouth 2 (two) times daily as needed for moderate pain. 12/03/15   [provider]  Omega-3 Fatty Acids (OMEGA 3 PO) Take 2 capsules by mouth daily.    [provider]  Jonetta Speak LANCETS 40J MISC USE ONE LANCET DAILY TO TEST BLOOD GLUCOSE 05/16/16   [provider]  ONETOUCH VERIO test strip USE ONE STRIP DAILY TO TEST BLOOD GLUCOSE. 05/15/16   [provider]  potassium chloride SA (KLOR-CON) 20 MEQ tablet Take 20 mEq by mouth 2 (two) times daily.    [provider]  Probiotic Product (ALIGN PO) Take 1 tablet by mouth every evening.    [provider]  rosuvastatin (CRESTOR) 10 MG tablet Take 10 mg by mouth at bedtime.    [provider]  TURMERIC PO Take 1 tablet by mouth daily.    [provider]  vitamin B-12 (CYANOCOBALAMIN) 1000 MCG tablet Take 1,000 mcg by mouth daily.  [provider]  vitamin C (ASCORBIC ACID) 250 MG tablet Take 250 mg by mouth daily.    [provider]     Allergies    Patient has no known allergies.    Review of Systems   Review of Systems  Constitutional: Negative.   HENT: Negative.    Respiratory: Negative.    Cardiovascular: Negative.   Gastrointestinal: Negative.   Genitourinary: Negative.   Musculoskeletal:  Negative for back pain, neck pain and neck stiffness.       Right hip, right shoulder pain  Skin: Negative.   Neurological: Negative.   All other systems reviewed and are negative.  Physical Exam Updated Vital Signs BP 126/76    Pulse 87    Temp 98.1 F (36.7 C) (Oral)     Resp (!) 21    SpO2 96%  Physical Exam Vitals and nursing note reviewed.  Constitutional:      General: He is not in acute distress.    Appearance: He is well-developed. He is not ill-appearing, toxic-appearing or diaphoretic.  HENT:     Head: Normocephalic and atraumatic.     Comments: No head trauma    Nose: Nose normal.     Mouth/Throat:     Mouth: Mucous membranes are dry.     Comments: No loose dentition Eyes:     Pupils: Pupils are equal, round, and reactive to light.  Neck:     Comments: NO midline tenderness, full ROM without difficulty Cardiovascular:     Rate and Rhythm: Normal rate and regular rhythm.     Pulses: Normal pulses.          Radial pulses are 2+ on the right side and 2+ on the left side.       Dorsalis pedis pulses are 2+ on the right side and 2+ on the left side.     Heart sounds: Normal heart sounds.  Pulmonary:     Effort: Pulmonary effort is normal. No respiratory distress.     Breath sounds: Normal breath sounds.     Comments: Clear bl, speaks in full sentences without difficulty Abdominal:     General: Bowel sounds are normal. There is no distension.     Palpations: Abdomen is soft.     Tenderness: There is no abdominal tenderness. There is no guarding or rebound.     Comments: Soft non tender  Musculoskeletal:        General: Normal range of motion.     Cervical back: Normal range of motion and neck supple.     Comments: No midline C/T/L tenderness.  Generalized tenderness to right anterior shoulder, nontender clavicle bilaterally.  Nontender humerus, forearm, elbow or hand.  Pelvis stable, nontender palpation.  Nontender bilateral femur, knee, tib-fib.  He is able to flex and extend bilateral lower extremities without difficulty.  No shortening or rotation of legs.  Wiggles toes without difficulty  Skin:    General: Skin is warm and dry.     Capillary Refill: Capillary refill takes less than 2 seconds.     Comments: No contusions, abrasions,  laceration  Neurological:     General: No focal deficit present.     Mental Status: He is alert and oriented to person, place, and time.    ED Results / Procedures / Treatments   Labs (all labs ordered are listed, but only abnormal results are displayed) Labs Reviewed  CBC WITH DIFFERENTIAL/PLATELET - Abnormal; Notable for the following components:  Result Value   RDW 17.2 (*)    Lymphs Abs 0.6 (*)    Monocytes Absolute 1.1 (*)    All other components within normal limits  BASIC METABOLIC PANEL - Abnormal; Notable for the following components:   Glucose, Bld 107 (*)    All other components within normal limits  CK - Abnormal; Notable for the following components:   Total CK 401 (*)    All other components within normal limits    EKG EKG Interpretation  Date/Time:  Sunday April 19 2021 11:13:26 EST Ventricular Rate:  88 PR Interval:  141 QRS Duration: 117 QT Interval:  413 QTC Calculation: 500 R Axis:   40 Text Interpretation: Sinus rhythm Consider right atrial enlargement Nonspecific intraventricular conduction delay Confirmed by Malvin Johns [phone]) on 04/19/2021 12:17:56 PM  Radiology DG Shoulder Right  Result Date: 04/19/2021 CLINICAL DATA:  Trauma, fall, pain EXAM: RIGHT SHOULDER - 2+ VIEW COMPARISON:  None. FINDINGS: No recent fracture or dislocation is seen. There is decreased distance between acromion and humeral head suggesting possible chronic tear of rotator cuff. Bony spurs seen in the right shoulder joint. IMPRESSION: No recent fracture or dislocation is seen in the right shoulder. Electronically Signed   By: Elmer Picker M.D.   On: 04/19/2021 10:53   DG Hip Unilat  With Pelvis 2-3 Views Right  Result Date: 04/19/2021 CLINICAL DATA:  Trauma, fall, pain EXAM: DG HIP (WITH OR WITHOUT PELVIS) 2-3V RIGHT COMPARISON:  None. FINDINGS: No displaced fracture or dislocation is seen. There are scattered arterial calcifications in the soft tissues. There are  few surgical clips overlying the pubic bones. Degenerative changes are noted in the visualized lower lumbar spine. IMPRESSION: No fracture or dislocation is seen in right hip. Lumbar spondylosis. Electronically Signed   By: Elmer Picker M.D.   On: 04/19/2021 10:52    Procedures Procedures    Medications Ordered in ED Medications - No data to display  ED Course/ Medical Decision Making/ A&P    76 year old here for evaluation after mechanical fall yesterday.  Denies hitting his head, LOC.  He is on Plavix.  No midline C/T/L tenderness.  He is neurovascularly intact.  Heart and lungs clear.  Abdomen soft, nontender.  Admits to right shoulder, right hip "aching."  He has full range of motion of all of his extremities.  Pelvis stable, nontender palpation.  No shortening or rotation of legs.  No obvious traumatic injuries on exam.  Patient stated his hip was bothering him so he subsequently slipped on the floor yesterday.  Per extensive chart review patient with history of frequent falls.  Has been admitted multiple times previously for similar.  Recently discharged from rehab 3 weeks ago.  Still having falls.  Patient is supposed to walk with a walker at baseline. Some memory issues as well. Hx of CHF however does not appear grossly fluid overloaded on exam. Clear lungs Bl. Hx of chronically elevated CK  Plan on labs, imaging, reassess:  CBC without leukocytosis BMP no significant abnormalities CK 401 EKG no ischemic changes DG right shoulder without any significant fracture, dislocation, effusion DG right hip, pelvis without fracture, dislocation  Patient reassessed.  Discussed labs and imaging.  Ambulatory with walker which he uses at home.  Initially had some dizziness however he was able to eat a food felt better, ambulate with walker without difficulty.  Discussed with family in room.  Sounds like he has had similar issues in the past, currently being worked up  by neurology, PCP.   Family feels comfortable taking patient home.  Encourage close follow-up, return for new or worsening symptoms.  Patient has nonfocal neuro exam without deficits currently.  Low suspicion for acute CVA, dissection, infectious process, cardiac etiology as cause of his unsteadiness on his feet which is been going on for months  The patient has been appropriately medically screened and/or stabilized in the ED. I have low suspicion for any other emergent medical condition which would require further screening, evaluation or treatment in the ED or require inpatient management.  Patient is hemodynamically stable and in no acute distress.  Patient able to ambulate in department prior to ED.  Evaluation does not show acute pathology that would require ongoing or additional emergent interventions while in the emergency department or further inpatient treatment.  I have discussed the diagnosis with the patient and answered all questions.  Pain is been managed while in the emergency department and patient has no further complaints prior to discharge.  Patient is comfortable with plan discussed in room and is stable for discharge at this time.  I have discussed strict return precautions for returning to the emergency department.  Patient was encouraged to follow-up with PCP/specialist refer to at discharge.   Patient seen by attending, Dr. Tamera Punt who agrees with above treatment, plan and disposition                           Medical Decision Making Amount and/or Complexity of Data Reviewed Independent Historian:     Details: son in room External Data Reviewed: labs, radiology, ECG and notes. Labs: ordered. Decision-making details documented in ED Course. Radiology: ordered and independent interpretation performed. Decision-making details documented in ED Course. ECG/medicine tests: ordered and independent interpretation performed. Decision-making details documented in ED Course.  Risk OTC drugs. Prescription  drug management.          Final Clinical Impression(s) / ED Diagnoses Final diagnoses:  Fall, initial encounter    Rx / DC Orders ED Discharge Orders     None         Benjamyn Hestand A, PA-C 04/19/21 1451    Malvin Johns, MD 04/19/21 1457

## 2021-04-19 NOTE — Discharge Instructions (Signed)
Sure to keep close follow-up with primary care and neurology  Return for new or worsening symptom

## 2021-04-19 NOTE — ED Triage Notes (Signed)
Per PTAR. Pt tripped and fell last night in the dark. Pt denies LOC or hitting head. Spent night on floor of house. Pt complains of R shoulder and hip pain with movement this am. Pt alert and oriented. Pt on blood thinners, but unsure which one.

## 2021-04-19 NOTE — ED Notes (Signed)
Pt wanted to try to ambulate again. Pt able to walk outside of room into the hall without dizziness with a walker. Walked 8 feet into the hallway.

## 2021-04-19 NOTE — ED Notes (Signed)
Pt was in an unheated room (like a sunroom) when he fell last night. Low temps were in the 20s last night

## 2021-04-19 NOTE — ED Notes (Signed)
Pt dressed by staff, assisted to use urinal and placed in w/c. Wife is en route.

## 2021-04-19 NOTE — ED Notes (Signed)
Peanut butter crackers and Diet Cola provided to pt

## 2021-04-19 NOTE — ED Notes (Signed)
Attempted to ambulate Pt, pt became dizzy when trying to sit up on the edge of the bed and unable to get to edge of the bed to attempt to ambulate. Assisted back to bed and back on monitor with call bell in reach. RN notified.

## 2021-06-19 ENCOUNTER — Other Ambulatory Visit: Payer: Self-pay | Admitting: Orthopaedic Surgery

## 2021-06-19 ENCOUNTER — Emergency Department (HOSPITAL_COMMUNITY): Payer: Medicare Other

## 2021-06-19 ENCOUNTER — Encounter (HOSPITAL_COMMUNITY): Payer: Self-pay | Admitting: Emergency Medicine

## 2021-06-19 ENCOUNTER — Telehealth (HOSPITAL_BASED_OUTPATIENT_CLINIC_OR_DEPARTMENT_OTHER): Payer: Self-pay

## 2021-06-19 ENCOUNTER — Other Ambulatory Visit (HOSPITAL_BASED_OUTPATIENT_CLINIC_OR_DEPARTMENT_OTHER): Payer: Self-pay | Admitting: Orthopaedic Surgery

## 2021-06-19 ENCOUNTER — Inpatient Hospital Stay (HOSPITAL_COMMUNITY)
Admission: EM | Admit: 2021-06-19 | Discharge: 2021-06-29 | DRG: 494 | Disposition: A | Discharge status: Home / Self Care | Payer: Medicare Other | Attending: Internal Medicine | Admitting: Internal Medicine

## 2021-06-19 ENCOUNTER — Other Ambulatory Visit: Payer: Self-pay

## 2021-06-19 DIAGNOSIS — R413 Other amnesia: Secondary | ICD-10-CM | POA: Diagnosis present

## 2021-06-19 DIAGNOSIS — I129 Hypertensive chronic kidney disease with stage 1 through stage 4 chronic kidney disease, or unspecified chronic kidney disease: Secondary | ICD-10-CM | POA: Diagnosis present

## 2021-06-19 DIAGNOSIS — I2510 Atherosclerotic heart disease of native coronary artery without angina pectoris: Secondary | ICD-10-CM | POA: Diagnosis present

## 2021-06-19 DIAGNOSIS — Z9181 History of falling: Secondary | ICD-10-CM

## 2021-06-19 DIAGNOSIS — E538 Deficiency of other specified B group vitamins: Secondary | ICD-10-CM | POA: Diagnosis present

## 2021-06-19 DIAGNOSIS — Z888 Allergy status to other drugs, medicaments and biological substances status: Secondary | ICD-10-CM

## 2021-06-19 DIAGNOSIS — Z833 Family history of diabetes mellitus: Secondary | ICD-10-CM

## 2021-06-19 DIAGNOSIS — I441 Atrioventricular block, second degree: Secondary | ICD-10-CM | POA: Diagnosis present

## 2021-06-19 DIAGNOSIS — M25571 Pain in right ankle and joints of right foot: Secondary | ICD-10-CM | POA: Diagnosis not present

## 2021-06-19 DIAGNOSIS — Z7902 Long term (current) use of antithrombotics/antiplatelets: Secondary | ICD-10-CM

## 2021-06-19 DIAGNOSIS — Z800 Family history of malignant neoplasm of digestive organs: Secondary | ICD-10-CM

## 2021-06-19 DIAGNOSIS — F17210 Nicotine dependence, cigarettes, uncomplicated: Secondary | ICD-10-CM | POA: Diagnosis present

## 2021-06-19 DIAGNOSIS — N182 Chronic kidney disease, stage 2 (mild): Secondary | ICD-10-CM | POA: Diagnosis present

## 2021-06-19 DIAGNOSIS — E1140 Type 2 diabetes mellitus with diabetic neuropathy, unspecified: Secondary | ICD-10-CM | POA: Diagnosis present

## 2021-06-19 DIAGNOSIS — Z791 Long term (current) use of non-steroidal anti-inflammatories (NSAID): Secondary | ICD-10-CM

## 2021-06-19 DIAGNOSIS — Y92003 Bedroom of unspecified non-institutional (private) residence as the place of occurrence of the external cause: Secondary | ICD-10-CM

## 2021-06-19 DIAGNOSIS — Z20822 Contact with and (suspected) exposure to covid-19: Secondary | ICD-10-CM | POA: Diagnosis present

## 2021-06-19 DIAGNOSIS — M353 Polymyalgia rheumatica: Secondary | ICD-10-CM | POA: Diagnosis present

## 2021-06-19 DIAGNOSIS — S82851A Displaced trimalleolar fracture of right lower leg, initial encounter for closed fracture: Secondary | ICD-10-CM | POA: Diagnosis not present

## 2021-06-19 DIAGNOSIS — E1122 Type 2 diabetes mellitus with diabetic chronic kidney disease: Secondary | ICD-10-CM | POA: Diagnosis present

## 2021-06-19 DIAGNOSIS — I455 Other specified heart block: Secondary | ICD-10-CM | POA: Diagnosis present

## 2021-06-19 DIAGNOSIS — Z7952 Long term (current) use of systemic steroids: Secondary | ICD-10-CM

## 2021-06-19 DIAGNOSIS — Z7982 Long term (current) use of aspirin: Secondary | ICD-10-CM

## 2021-06-19 DIAGNOSIS — G8929 Other chronic pain: Secondary | ICD-10-CM | POA: Diagnosis present

## 2021-06-19 DIAGNOSIS — Z955 Presence of coronary angioplasty implant and graft: Secondary | ICD-10-CM

## 2021-06-19 DIAGNOSIS — W06XXXA Fall from bed, initial encounter: Secondary | ICD-10-CM | POA: Diagnosis present

## 2021-06-19 DIAGNOSIS — S82891A Other fracture of right lower leg, initial encounter for closed fracture: Principal | ICD-10-CM

## 2021-06-19 DIAGNOSIS — Z8042 Family history of malignant neoplasm of prostate: Secondary | ICD-10-CM

## 2021-06-19 DIAGNOSIS — W19XXXA Unspecified fall, initial encounter: Secondary | ICD-10-CM | POA: Diagnosis present

## 2021-06-19 DIAGNOSIS — E785 Hyperlipidemia, unspecified: Secondary | ICD-10-CM | POA: Diagnosis present

## 2021-06-19 DIAGNOSIS — M810 Age-related osteoporosis without current pathological fracture: Secondary | ICD-10-CM | POA: Diagnosis present

## 2021-06-19 DIAGNOSIS — Z87442 Personal history of urinary calculi: Secondary | ICD-10-CM

## 2021-06-19 DIAGNOSIS — Z8249 Family history of ischemic heart disease and other diseases of the circulatory system: Secondary | ICD-10-CM

## 2021-06-19 DIAGNOSIS — S93431A Sprain of tibiofibular ligament of right ankle, initial encounter: Secondary | ICD-10-CM | POA: Diagnosis present

## 2021-06-19 DIAGNOSIS — E876 Hypokalemia: Secondary | ICD-10-CM | POA: Diagnosis present

## 2021-06-19 DIAGNOSIS — Z79899 Other long term (current) drug therapy: Secondary | ICD-10-CM

## 2021-06-19 NOTE — ED Provider Notes (Signed)
?[facility] ?Provider Note ? ?CSN: [id number] ?Arrival date & time: 06/19/21 1552 ? ?Chief Complaint(s) ?Ankle Injury ? ?HPI ?Brent Griffin. is a 76 y.o. male who presents emergency department for evaluation of right ankle injury.  Patient fell 5 weeks ago and due to his neuropathy he has still been walking on the foot.  The patient was seen at [facility] urgent care who discovered a trimalleolar fracture and informed the patient that he needed to be nonweightbearing.  Patient did not understand that this meant that he would be able to use crutches and stated that he will be unable to care for himself at home if he is nonweightbearing.  He was then instructed to come to the emergency department for evaluation of possible rehab facility placement if he truly is unable to bear weight.  He denies chest pain, shortness breath, abdominal pain or nausea, vomiting or other systemic symptoms. ? ? ?Ankle Injury ? ? ?Past Medical History ?Past Medical History:  ?Diagnosis Date  ? Arthritis   ? thumb right hand  ? Cancer [facility]) 2020  ? Prostrate- tx Radiation  ? Diabetes mellitus   ? type II, not taking medications - 03/31/20  ? History of kidney stones   ? x 3  ? Hyperlipidemia   ? Hypertension   ? Metatarsalgia   ? left foot  ? 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 ?Patient Active Problem List  ? Diagnosis Date Noted  ? Diarrhea 06/06/2020  ? Change in bowel habits 06/06/2020  ? Surgery, elective   ? Acute metabolic encephalopathy 29/47/6546  ? Closed fracture of left distal humerus 04/01/2020  ? Hypokalemia 03/13/2018  ? Adjustment insomnia 03/10/2018  ? Renal insufficiency 03/10/2018  ? Tobacco use disorder 03/10/2018  ? Neck pain 12/01/2017  ? 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 Urinary hesitancy 09/08/2017  ? Prostate cancer ([place]) 01/16/2016  ? Hyperlipidemia LDL goal <100 08/27/2014  ? Hyperlipidemia associated with type 2 diabetes  mellitus ([place]) 08/27/2014  ? Muscle spasm of back 02/13/2014  ? Rosacea 02/13/2014  ? Scoliosis 02/13/2014  ? Essential hypertension 02/12/2014  ? Elevated prostate specific antigen (PSA) 01/06/2014  ? Muscle spasms of neck 12/17/2013  ? Type II diabetes mellitus ([place]) 05/13/2013  ? ?Home Medication(s) ?Prior to Admission medications   ?Medication Sig Start Date End Date Taking? Authorizing Provider  ?acetaminophen (TYLENOL) 500 MG tablet Take 1,000 mg by mouth every 6 (six) hours as needed for moderate pain or headache.    [provider]  ?amLODipine (NORVASC) 10 MG tablet Take 10 mg by mouth daily. 01/19/16   [provider]  ?aspirin EC 81 MG tablet Take 81 mg by mouth daily. Swallow whole.    [provider]  ?Cinnamon 500 MG capsule Take 500 mg by mouth daily.    [provider]  ?doxycycline (VIBRA-TABS) 100 MG tablet Take 100 mg by mouth daily. 01/19/16   [provider]  ?folic acid (FOLVITE) 1 MG tablet Take 1 mg by mouth daily.    [provider]  ?hydrochlorothiazide (HYDRODIURIL) 25 MG tablet Take 25 mg by mouth daily. 02/28/16   [provider]  ?loperamide (IMODIUM) 2 MG capsule Take 2 mg by mouth every evening.    [provider]  ?losartan (COZAAR) 100 MG tablet Take 100 mg by mouth daily. 04/20/16   [provider]  ?metoprolol succinate (TOPROL-XL) 100 MG 24 hr tablet Take 150  mg by mouth at bedtime. 05/18/16   [provider]  ?Multiple Vitamin (MULTI-VITAMINS) TABS Take 1 tablet by mouth daily.    [provider]  ?Multiple Vitamins-Minerals (ZINC PO) Take 1 tablet by mouth daily.    [provider]  ?naproxen (NAPROSYN) 500 MG tablet Take 500 mg by mouth 2 (two) times daily as needed for moderate pain. 12/03/15   [provider]  ?Omega-3 Fatty Acids (OMEGA 3 PO) Take 2 capsules by mouth daily.    [provider]  ?Jonetta Speak LANCETS 10G MISC USE ONE LANCET DAILY TO  TEST BLOOD GLUCOSE 05/16/16   [provider]  ?Roma Schanz test strip USE ONE STRIP DAILY TO TEST BLOOD GLUCOSE. 05/15/16   [provider]  ?potassium chloride SA (KLOR-CON) 20 MEQ tablet Take 20 mEq by mouth 2 (two) times daily.    [provider]  ?Probiotic Product (ALIGN PO) Take 1 tablet by mouth every evening.    [provider]  ?rosuvastatin (CRESTOR) 10 MG tablet Take 10 mg by mouth at bedtime.    [provider]  ?TURMERIC PO Take 1 tablet by mouth daily.    [provider]  ?vitamin B-12 (CYANOCOBALAMIN) 1000 MCG tablet Take 1,000 mcg by mouth daily.    [provider]  ?vitamin C (ASCORBIC ACID) 250 MG tablet Take 250 mg by mouth daily.    [provider]  ?                                                                                                                                  ?Past Surgical History ?Past Surgical History:  ?Procedure Laterality Date  ? COLONOSCOPY    ? FOOT FRACTURE SURGERY Bilateral   ? FOOT SURGERY Left 12/2018  ? LITHOTRIPSY    ? [place]  ? ORIF ELBOW FRACTURE Left 04/01/2020  ? Procedure: OPEN REDUCTION INTERNAL FIXATION (ORIF) SUPRACONDYLAR ELBOW  FRACTURE, OLECRANON OSTEOTOMY AND BURSECTOMY;  Surgeon: Roseanne Kaufman, MD;  Location: [facility];  Service: Orthopedics;  Laterality: Left;  ? ROTATOR CUFF REPAIR Right 2018  ? ULNAR NERVE TRANSPOSITION Left 04/01/2020  ? Procedure: ULNAR NERVE RELEASE AND TRANSPOSITION;  Surgeon: Roseanne Kaufman, MD;  Location: [facility];  Service: Orthopedics;  Laterality: Left;  ? VASECTOMY  1984  ? ?Family History ?Family History  ?Problem Relation Age of Onset  ? Colon cancer Other 69  ? Prostate cancer Father   ? Diabetes Mellitus II Father   ? Liver disease Neg Hx   ? Esophageal cancer Neg Hx   ? Pancreatic cancer Neg Hx   ? Stomach cancer Neg Hx   ? ? ?Social History ?Social History  ? ?Tobacco Use  ? Smoking status: Some Days  ?  Years: 30.00  ?  Types:  Cigarettes  ? Smokeless tobacco: Never  ? Tobacco comments:  ?  1 a week  ?Vaping Use  ?  Vaping Use: Never used  ?Substance Use Topics  ? Alcohol use: No  ? Drug use: No  ? ?Allergies ?Donepezil ? ?Review of Systems ?Review of Systems  ?Musculoskeletal:  Positive for arthralgias.  ? ?Physical Exam ?Vital Signs  ?I have reviewed the triage vital signs ?BP (!) 147/108 (BP Location: Right Arm)   Pulse 69   Temp 98 ?F (36.7 ?C) (Oral)   Resp 18   SpO2 98%  ? ?Physical Exam ?Vitals and nursing note reviewed.  ?Constitutional:   ?   General: He is not in acute distress. ?   Appearance: He is well-developed.  ?HENT:  ?   Head: Normocephalic and atraumatic.  ?Eyes:  ?   Conjunctiva/sclera: Conjunctivae normal.  ?Cardiovascular:  ?   Rate and Rhythm: Normal rate and regular rhythm.  ?   Heart sounds: No murmur heard. ?Pulmonary:  ?   Effort: Pulmonary effort is normal. No respiratory distress.  ?   Breath sounds: Normal breath sounds.  ?Abdominal:  ?   Palpations: Abdomen is soft.  ?   Tenderness: There is no abdominal tenderness.  ?Musculoskeletal:     ?   General: Swelling, tenderness and deformity (R ankle) present.  ?   Cervical back: Neck supple.  ?Skin: ?   General: Skin is warm and dry.  ?   Capillary Refill: Capillary refill takes less than 2 seconds.  ?Neurological:  ?   Mental Status: He is alert.  ?Psychiatric:     ?   Mood and Affect: Mood normal.  ? ? ?ED Results and Treatments ?Labs ?(all labs ordered are listed, but only abnormal results are displayed) ?Labs Reviewed - No data to display                                                                                                                       ? ?Radiology ?CT Ankle Right Wo Contrast ? ?Result Date: 06/19/2021 ?CLINICAL DATA:  Ankle trauma, fracture. Patient broke is ankle about a month ago. Preop planning. EXAM: CT OF THE RIGHT ANKLE WITHOUT CONTRAST TECHNIQUE: Multidetector CT imaging of the right ankle was performed according to the standard  protocol. Multiplanar CT image reconstructions were also generated. RADIATION DOSE REDUCTION: This exam was performed according to the departmental dose-optimization program which includes automated exposure contro

## 2021-06-19 NOTE — ED Provider Triage Note (Signed)
Emergency Medicine Provider Triage Evaluation Note ? ?Brent Griffin , a 76 y.o. male  was evaluated in triage.  Pt complains of right ankle pain.  He had a ankle injury 4 weeks ago.  He was seen by a surgeon today after seeing someone else with [facility].  He has an order for a CT of his right ankle with out contrast.  His wife reports that she talked with the nurse at [facility] who said the doctor wanted him to be admitted?  Unclear why.  ? ? ? ?Physical Exam  ?BP (!) 137/102 (BP Location: Right Arm)   Pulse 77   Temp 99 ?F (37.2 ?C) (Oral)   Resp 18   SpO2 98%  ?Gen:   Awake, no distress   ?Resp:  Normal effort  ?MSK:   Moves extremities without difficulty  ?Other:  Normal speech, right foot in boot.  ? ?Medical Decision Making  ?Medically screening exam initiated at 4:48 PM.  Appropriate orders placed.  Brent Griffin. was informed that the remainder of the evaluation will be completed by another provider, this initial triage assessment does not replace that evaluation, and the importance of remaining in the ED until their evaluation is complete. ? ?Will order CT of the foot as requested on the paper order he showed me.  I am unclear if he will actually require admission therefore we will hold off on ordering blood work that would only be required if he is admitted. ?  ?Lorin Glass, PA-C ?06/19/21 1656 ? ?

## 2021-06-19 NOTE — ED Triage Notes (Signed)
Pt sent by ortho at [facility] for CT scan of his right ankle and admission for potential surgery. Pt states he broke his ankle x 1 month ago. No other complaints at this time.  ?

## 2021-06-20 ENCOUNTER — Encounter (HOSPITAL_COMMUNITY): Payer: Self-pay | Admitting: Internal Medicine

## 2021-06-20 DIAGNOSIS — Z7902 Long term (current) use of antithrombotics/antiplatelets: Secondary | ICD-10-CM | POA: Diagnosis not present

## 2021-06-20 DIAGNOSIS — Z79899 Other long term (current) drug therapy: Secondary | ICD-10-CM | POA: Diagnosis not present

## 2021-06-20 DIAGNOSIS — W19XXXA Unspecified fall, initial encounter: Secondary | ICD-10-CM | POA: Diagnosis present

## 2021-06-20 DIAGNOSIS — Z800 Family history of malignant neoplasm of digestive organs: Secondary | ICD-10-CM | POA: Diagnosis not present

## 2021-06-20 DIAGNOSIS — E1140 Type 2 diabetes mellitus with diabetic neuropathy, unspecified: Secondary | ICD-10-CM | POA: Diagnosis present

## 2021-06-20 DIAGNOSIS — W06XXXA Fall from bed, initial encounter: Secondary | ICD-10-CM | POA: Diagnosis present

## 2021-06-20 DIAGNOSIS — M25571 Pain in right ankle and joints of right foot: Secondary | ICD-10-CM | POA: Diagnosis present

## 2021-06-20 DIAGNOSIS — E1122 Type 2 diabetes mellitus with diabetic chronic kidney disease: Secondary | ICD-10-CM | POA: Diagnosis present

## 2021-06-20 DIAGNOSIS — S82851A Displaced trimalleolar fracture of right lower leg, initial encounter for closed fracture: Secondary | ICD-10-CM | POA: Diagnosis present

## 2021-06-20 DIAGNOSIS — Z8042 Family history of malignant neoplasm of prostate: Secondary | ICD-10-CM | POA: Diagnosis not present

## 2021-06-20 DIAGNOSIS — Z7952 Long term (current) use of systemic steroids: Secondary | ICD-10-CM | POA: Diagnosis not present

## 2021-06-20 DIAGNOSIS — E538 Deficiency of other specified B group vitamins: Secondary | ICD-10-CM | POA: Diagnosis present

## 2021-06-20 DIAGNOSIS — I441 Atrioventricular block, second degree: Secondary | ICD-10-CM | POA: Diagnosis present

## 2021-06-20 DIAGNOSIS — Z8249 Family history of ischemic heart disease and other diseases of the circulatory system: Secondary | ICD-10-CM | POA: Diagnosis not present

## 2021-06-20 DIAGNOSIS — I129 Hypertensive chronic kidney disease with stage 1 through stage 4 chronic kidney disease, or unspecified chronic kidney disease: Secondary | ICD-10-CM | POA: Diagnosis present

## 2021-06-20 DIAGNOSIS — Y92003 Bedroom of unspecified non-institutional (private) residence as the place of occurrence of the external cause: Secondary | ICD-10-CM | POA: Diagnosis not present

## 2021-06-20 DIAGNOSIS — S93431A Sprain of tibiofibular ligament of right ankle, initial encounter: Secondary | ICD-10-CM | POA: Diagnosis present

## 2021-06-20 DIAGNOSIS — I455 Other specified heart block: Secondary | ICD-10-CM | POA: Diagnosis present

## 2021-06-20 DIAGNOSIS — N182 Chronic kidney disease, stage 2 (mild): Secondary | ICD-10-CM | POA: Diagnosis present

## 2021-06-20 DIAGNOSIS — F17210 Nicotine dependence, cigarettes, uncomplicated: Secondary | ICD-10-CM | POA: Diagnosis present

## 2021-06-20 DIAGNOSIS — Z833 Family history of diabetes mellitus: Secondary | ICD-10-CM | POA: Diagnosis not present

## 2021-06-20 DIAGNOSIS — I2510 Atherosclerotic heart disease of native coronary artery without angina pectoris: Secondary | ICD-10-CM | POA: Diagnosis present

## 2021-06-20 DIAGNOSIS — E785 Hyperlipidemia, unspecified: Secondary | ICD-10-CM | POA: Diagnosis present

## 2021-06-20 DIAGNOSIS — M810 Age-related osteoporosis without current pathological fracture: Secondary | ICD-10-CM | POA: Diagnosis present

## 2021-06-20 DIAGNOSIS — G8929 Other chronic pain: Secondary | ICD-10-CM | POA: Diagnosis present

## 2021-06-20 DIAGNOSIS — M353 Polymyalgia rheumatica: Secondary | ICD-10-CM | POA: Diagnosis present

## 2021-06-20 DIAGNOSIS — E876 Hypokalemia: Secondary | ICD-10-CM | POA: Diagnosis present

## 2021-06-20 DIAGNOSIS — Z20822 Contact with and (suspected) exposure to covid-19: Secondary | ICD-10-CM | POA: Diagnosis present

## 2021-06-20 LAB — CBC WITH DIFFERENTIAL/PLATELET
Abs Immature Granulocytes: 0.01 10*3/uL (ref 0.00–0.07)
Basophils Absolute: 0 10*3/uL (ref 0.0–0.1)
Basophils Relative: 1 %
Eosinophils Absolute: 0.1 10*3/uL (ref 0.0–0.5)
Eosinophils Relative: 1 %
HCT: 46.6 % (ref 39.0–52.0)
Hemoglobin: 14.7 g/dL (ref 13.0–17.0)
Immature Granulocytes: 0 %
Lymphocytes Relative: 12 %
Lymphs Abs: 0.7 10*3/uL (ref 0.7–4.0)
MCH: 29.1 pg (ref 26.0–34.0)
MCHC: 31.5 g/dL (ref 30.0–36.0)
MCV: 92.1 fL (ref 80.0–100.0)
Monocytes Absolute: 0.4 10*3/uL (ref 0.1–1.0)
Monocytes Relative: 8 %
Neutro Abs: 4.3 10*3/uL (ref 1.7–7.7)
Neutrophils Relative %: 78 %
Platelets: 160 10*3/uL (ref 150–400)
RBC: 5.06 MIL/uL (ref 4.22–5.81)
RDW: 17.6 % — ABNORMAL HIGH (ref 11.5–15.5)
WBC: 5.5 10*3/uL (ref 4.0–10.5)
nRBC: 0 % (ref 0.0–0.2)

## 2021-06-20 LAB — PROTIME-INR
INR: 1 (ref 0.8–1.2)
Prothrombin Time: 13.1 seconds (ref 11.4–15.2)

## 2021-06-20 LAB — COMPREHENSIVE METABOLIC PANEL
ALT: 19 U/L (ref 0–44)
AST: 22 U/L (ref 15–41)
Albumin: 3.2 g/dL — ABNORMAL LOW (ref 3.5–5.0)
Alkaline Phosphatase: 116 U/L (ref 38–126)
Anion gap: 10 (ref 5–15)
BUN: 6 mg/dL — ABNORMAL LOW (ref 8–23)
CO2: 27 mmol/L (ref 22–32)
Calcium: 9.1 mg/dL (ref 8.9–10.3)
Chloride: 103 mmol/L (ref 98–111)
Creatinine, Ser: 0.94 mg/dL (ref 0.61–1.24)
GFR, Estimated: >60 mL/min (ref >60)
Glucose, Bld: 137 mg/dL — ABNORMAL HIGH (ref 70–99)
Potassium: 3.4 mmol/L — ABNORMAL LOW (ref 3.5–5.1)
Sodium: 140 mmol/L (ref 135–145)
Total Bilirubin: 0.8 mg/dL (ref 0.3–1.2)
Total Protein: 6.7 g/dL (ref 6.5–8.1)

## 2021-06-20 LAB — RESP PANEL BY RT-PCR (FLU A&B, COVID) ARPGX2
Influenza A by PCR: NEGATIVE
Influenza B by PCR: NEGATIVE
SARS Coronavirus 2 by RT PCR: NEGATIVE

## 2021-06-20 MED ORDER — NICOTINE 14 MG/24HR TD PT24
14.0000 mg | MEDICATED_PATCH | Freq: Every day | TRANSDERMAL | Status: DC
  Administered 2021-06-23 – 2021-06-29 (×7): 14 mg via TRANSDERMAL
  Filled 2021-06-20 (×8): qty 1

## 2021-06-20 MED ORDER — ACETAMINOPHEN 650 MG RE SUPP: 650.0000 mg | Freq: Four times a day (QID) | RECTAL | Status: DC | PRN

## 2021-06-20 MED ORDER — METOPROLOL SUCCINATE ER 50 MG PO TB24
100.0000 mg | ORAL_TABLET | Freq: Every day | ORAL | Status: DC
  Administered 2021-06-20: 100 mg via ORAL
  Filled 2021-06-20: qty 2

## 2021-06-20 MED ORDER — ROSUVASTATIN CALCIUM 20 MG PO TABS
20.0000 mg | ORAL_TABLET | Freq: Every day | ORAL | Status: DC
  Administered 2021-06-20 – 2021-06-28 (×9): 20 mg via ORAL
  Filled 2021-06-20 (×9): qty 1

## 2021-06-20 MED ORDER — SPIRONOLACTONE 12.5 MG HALF TABLET
12.5000 mg | ORAL_TABLET | Freq: Every day | ORAL | Status: DC
  Administered 2021-06-20 – 2021-06-29 (×9): 12.5 mg via ORAL
  Filled 2021-06-20 (×10): qty 1

## 2021-06-20 MED ORDER — AMLODIPINE BESYLATE 10 MG PO TABS
10.0000 mg | ORAL_TABLET | Freq: Every day | ORAL | Status: DC
  Administered 2021-06-20 – 2021-06-29 (×10): 10 mg via ORAL
  Filled 2021-06-20: qty 1
  Filled 2021-06-20: qty 2
  Filled 2021-06-20 (×8): qty 1

## 2021-06-20 MED ORDER — CLOPIDOGREL BISULFATE 75 MG PO TABS
75.0000 mg | ORAL_TABLET | Freq: Every day | ORAL | Status: DC
Start: 2021-06-20 — End: 2021-06-21
  Administered 2021-06-20 – 2021-06-21 (×2): 75 mg via ORAL
  Filled 2021-06-20 (×2): qty 1

## 2021-06-20 MED ORDER — METOPROLOL SUCCINATE ER 25 MG PO TB24: 150.0000 mg | ORAL_TABLET | Freq: Every day | ORAL | Status: DC

## 2021-06-20 MED ORDER — POTASSIUM CHLORIDE CRYS ER 20 MEQ PO TBCR
20.0000 meq | EXTENDED_RELEASE_TABLET | Freq: Two times a day (BID) | ORAL | Status: DC
  Administered 2021-06-20 – 2021-06-29 (×17): 20 meq via ORAL
  Filled 2021-06-20 (×18): qty 1

## 2021-06-20 MED ORDER — MELATONIN 3 MG PO TABS
3.0000 mg | ORAL_TABLET | Freq: Every evening | ORAL | Status: DC | PRN
  Administered 2021-06-20 – 2021-06-28 (×8): 3 mg via ORAL
  Filled 2021-06-20 (×8): qty 1

## 2021-06-20 MED ORDER — VITAMIN B-12 1000 MCG PO TABS
1000.0000 ug | ORAL_TABLET | Freq: Every day | ORAL | Status: DC
  Administered 2021-06-20 – 2021-06-29 (×9): 1000 ug via ORAL
  Filled 2021-06-20 (×10): qty 1

## 2021-06-20 MED ORDER — TIZANIDINE HCL 4 MG PO TABS
2.0000 mg | ORAL_TABLET | Freq: Four times a day (QID) | ORAL | Status: DC | PRN
  Administered 2021-06-27 – 2021-06-29 (×3): 2 mg via ORAL
  Filled 2021-06-20 (×4): qty 1

## 2021-06-20 MED ORDER — ACETAMINOPHEN 325 MG PO TABS
650.0000 mg | ORAL_TABLET | Freq: Four times a day (QID) | ORAL | Status: DC | PRN
Start: 2021-06-20 — End: 2021-06-29
  Administered 2021-06-26: 650 mg via ORAL
  Filled 2021-06-20 (×3): qty 2

## 2021-06-20 MED ORDER — MELATONIN 3 MG PO TABS
3.0000 mg | ORAL_TABLET | Freq: Once | ORAL | Status: AC
  Administered 2021-06-20: 3 mg via ORAL
  Filled 2021-06-20: qty 1

## 2021-06-20 MED ORDER — ENOXAPARIN SODIUM 40 MG/0.4ML IJ SOSY
40.0000 mg | PREFILLED_SYRINGE | INTRAMUSCULAR | Status: DC
  Administered 2021-06-20: 40 mg via SUBCUTANEOUS
  Filled 2021-06-20: qty 0.4

## 2021-06-20 MED ORDER — LOSARTAN POTASSIUM 50 MG PO TABS
100.0000 mg | ORAL_TABLET | Freq: Every day | ORAL | Status: DC
  Administered 2021-06-20 – 2021-06-29 (×10): 100 mg via ORAL
  Filled 2021-06-20 (×11): qty 2

## 2021-06-20 MED ORDER — ASPIRIN EC 81 MG PO TBEC
81.0000 mg | DELAYED_RELEASE_TABLET | Freq: Every day | ORAL | Status: DC
Start: 2021-06-20 — End: 2021-06-21
  Administered 2021-06-20 – 2021-06-21 (×2): 81 mg via ORAL
  Filled 2021-06-20 (×2): qty 1

## 2021-06-20 MED ORDER — MEMANTINE HCL 10 MG PO TABS
5.0000 mg | ORAL_TABLET | Freq: Every day | ORAL | Status: DC
  Administered 2021-06-20 – 2021-06-29 (×10): 5 mg via ORAL
  Filled 2021-06-20 (×10): qty 1

## 2021-06-20 MED ORDER — PREDNISONE 10 MG PO TABS
10.0000 mg | ORAL_TABLET | Freq: Every day | ORAL | Status: DC
Start: 2021-06-20 — End: 2021-06-29
  Administered 2021-06-20 – 2021-06-29 (×9): 10 mg via ORAL
  Filled 2021-06-20 (×10): qty 1

## 2021-06-20 NOTE — ED Notes (Signed)
Pt alert, NAD, calm, interactive, resps e/u, speaking in clear complete sentences, repositioned in bed, elevated RLE, given warm blankets. CBIR.  ?

## 2021-06-20 NOTE — ED Notes (Signed)
PT at BS.

## 2021-06-20 NOTE — ED Notes (Signed)
Admitting at BS

## 2021-06-20 NOTE — ED Provider Notes (Signed)
Patient seen yesterday for continued ankle pain and difficulty with ambulation after an injury 5 weeks ago.  He reportedly was seen at [facility] urgent care and had x-rays showing concern for trimalleolar fracture.  Patient was sent in for further evaluation.  Patient had CT scan that confirmed Tri-mal fracture and patient was placed in a splint.  He was supposed to be discharged and follow-up with Dr. Lucia Gaskins with [facility] on Monday to discuss surgical management. ? ?Patient had difficult time with ambulation and physical therapy saw the patient.  Physical therapy says he is unsafe to go home and will need admission.  Will call [facility] to discuss if they would like to admitted to the hospital for possible surgical repair.  He does not have any new trauma or any new changes but reports physical therapy said he is not able to go home at this time ? ?Spoke to Dr. Lynann Bologna who is on-call for [place] who is going to call Dr. Lucia Gaskins, to give orthopedic surgeon who supposed to see him next week.  Will await callback from orthopedics however they feel patient is to be admitted to the hospitalist service for further management as patient is not safe for discharge home and will likely get surgery in the next few days. ? ?Will await for labs to return, orthopedic to call back, and then will call medicine for admission for ankle fracture with inability to be discharged home per PT.  ? ?2:46 PM ?Just spoke to orthopedics again.  Dr. Lucia Gaskins will review the images and drop a note but he will likely do surgery on Monday or Tuesday so they are requesting patient be admitted to medicine service for medical management to go along with the ankle injury requiring surgery within the next few days. ? ? ?Spoke to medicine who will admit for further management. ? ? ?Clinical Impression: ?1. Closed fracture of right ankle, initial encounter   ? ? ?Disposition: Admit ? ?This note was prepared with  assistance of Systems analyst. Occasional wrong-word or sound-a-like substitutions may have occurred due to the inherent limitations of voice recognition software. ? ? ? ? ? ?  ?Jakaiya Netherland, Gwenyth Allegra, MD ?06/20/21 1531 ? ?

## 2021-06-20 NOTE — Progress Notes (Signed)
Orthopedic Tech Progress Note ?Patient Details:  ?Brent Griffin. ?1945-11-15 ?[id number] ? ?Ortho Devices ?Type of Ortho Device: Post (short leg) splint, Stirrup splint ?Ortho Device/Splint Location: rle ?Ortho Device/Splint Interventions: Ordered, Application, Adjustment ?  ?Post Interventions ?Patient Tolerated: Well ?Instructions Provided: Care of device, Adjustment of device ? ?Karolee Stamps ?06/20/2021, 1:56 AM ? ?

## 2021-06-20 NOTE — ED Notes (Signed)
Admitting Team at [facility] ?

## 2021-06-20 NOTE — ED Notes (Signed)
EDP Dr. Sherry Ruffing in to see, at [facility].  ?

## 2021-06-20 NOTE — ED Notes (Signed)
RLE splint intact. CMS intact. Denies pain. To be admitted .  ?

## 2021-06-20 NOTE — Evaluation (Signed)
Physical Therapy Evaluation ?Patient Details ?Name: Brent Griffin. ?MRN: [id number] ?DOB: 1945/12/31 ?Today's Date: 06/20/2021 ? ?History of Present Illness ? The pt is a 76 yo male presenting 3/17 due to ongoing pain and injury to R ankle. The pt fell 5 weeks ago and has been walking on the foot since then, seen by OP ortho and imaging revealed trimalleolar fx so pt was instructed to maintain NWB. PMH includes: arthritis, cancer, DM II, HLD, HTN, kidney stones, and former alcohol abuse. ?  ?Clinical Impression ? Pt in bed upon arrival of PT, agreeable to evaluation at this time. Prior to ankle injury, the pt was independent with all mobility without need for AD, living with his wife in a home with 5 steps to enter. The pt does have DME such as RW and WC which make it easier for him to maintain NWB, but he requires minA to steady with sit-stand transfers and was limited to 6-8 ft hopping with RW at this time. He required minA to steady with hopping (even with RW), and progressively was less able to maintain NWB despite repeated cues. The pt would be unsafe to manage 5 steps to enter his home and states his wife has recently hurt her back and is unable to assist him physically in any way. Given high frequency of falls in the past and current limitations, pt would be at high risk for falls at home. Will continue to benefit from skilled PT acutely to progress safety and endurance for OOB mobility, will plan to re-evaluate after surgery.  ?   ? ?Recommendations for follow up therapy are one component of a multi-disciplinary discharge planning process, led by the attending physician.  Recommendations may be updated based on patient status, additional functional criteria and insurance authorization. ? ?Follow Up Recommendations Skilled nursing-short term rehab (<3 hours/day) ? ?  ?Assistance Recommended at Discharge Frequent or constant Supervision/Assistance  ?Patient can return home with the following ? A lot of help  with walking and/or transfers;A little help with bathing/dressing/bathroom;Assist for transportation;Help with stairs or ramp for entrance ? ?  ?Equipment Recommendations BSC/3in1  ?Recommendations for Other Services ?    ?  ?Functional Status Assessment Patient has had a recent decline in their functional status and demonstrates the ability to make significant improvements in function in a reasonable and predictable amount of time.  ? ?  ?Precautions / Restrictions Precautions ?Precautions: Fall ?Required Braces or Orthoses: Splint/Cast ?Splint/Cast: R ankle ?Restrictions ?Weight Bearing Restrictions: Yes ?RLE Weight Bearing: Non weight bearing  ? ?  ? ?Mobility ? Bed Mobility ?Overal bed mobility: Modified Independent ?  ?  ?  ?  ?  ?  ?General bed mobility comments: increased time and effort ?  ? ?Transfers ?Overall transfer level: Needs assistance ?Equipment used: Rolling walker (2 wheels) ?Transfers: Sit to/from Stand ?Sit to Stand: Min assist ?  ?  ?  ?  ?  ?General transfer comment: minA to power up, cues for NWB RLE ?  ? ?Ambulation/Gait ?Ambulation/Gait assistance: Min assist ?Gait Distance (Feet): 8 Feet ?Assistive device: Rolling walker (2 wheels) ?  ?Gait velocity: decreased ?Gait velocity interpretation: <1.31 ft/sec, indicative of household ambulator ?  ?General Gait Details: hop-to pattern with RLE needing mod cues to maintain NWB. pt reports fatigued after ~6 ft, progressively worse ability to maintain NWB ?  ? ?Balance Overall balance assessment: Needs assistance, History of Falls ?Sitting-balance support: No upper extremity supported, Feet supported ?Sitting balance-Leahy Scale: Good ?  ?  ?  Standing balance support: Bilateral upper extremity supported, During functional activity, Reliant on assistive device for balance ?Standing balance-Leahy Scale: Poor ?Standing balance comment: dependent on BUE support ?  ?  ?  ?  ?  ?  ?  ?  ?  ?  ?  ?   ? ? ? ?Pertinent Vitals/Pain Pain Assessment ?Pain  Assessment: No/denies pain  ? ? ?Home Living Family/patient expects to be discharged to:: Private residence ?  ?Available Help at Discharge: Family;Available 24 hours/day ?Type of Home: House ?Home Access: Stairs to enter ?Entrance Stairs-Rails: Right ?Entrance Stairs-Number of Steps: 4+1 ?  ?Home Layout: One level ?Home Equipment: Grab bars - tub/shower;Shower Land (2 wheels);Transport chair ?   ?  ?Prior Function Prior Level of Function : Independent/Modified Independent;Driving;History of Falls (last six months) ?  ?  ?  ?  ?  ?  ?Mobility Comments: pt reports no DME needs prior to fall 5 weeks ago. has multiple falls in last 6 months ?ADLs Comments: reports typically independent. ?  ? ? ?Hand Dominance  ? Dominant Hand: Right ? ?  ?Extremity/Trunk Assessment  ? Upper Extremity Assessment ?Upper Extremity Assessment: Overall WFL for tasks assessed ?  ? ?Lower Extremity Assessment ?Lower Extremity Assessment: RLE deficits/detail ?RLE Deficits / Details: in splint, able to move at hip and knee, NWB ?RLE Sensation: WNL ?RLE Coordination: WNL ?  ? ?Cervical / Trunk Assessment ?Cervical / Trunk Assessment: Normal  ?Communication  ? Communication: No difficulties  ?Cognition Arousal/Alertness: Awake/alert ?Behavior During Therapy: [facility] for tasks assessed/performed ?Overall Cognitive Status: Impaired/Different from baseline ?Area of Impairment: Safety/judgement ?  ?  ?  ?  ?  ?  ?  ?  ?  ?  ?  ?  ?Safety/Judgement: Decreased awareness of safety, Decreased awareness of deficits ?  ?  ?General Comments: pt able to follow all instructions and cues, does need repeated cues for NWB with mobility, continues to ask if safe to return home despite conversation through session about how it would not be safe ?  ?  ? ?  ?General Comments General comments (skin integrity, edema, etc.): VSS on RA ? ?  ?Exercises    ? ?Assessment/Plan  ?  ?PT Assessment Patient needs continued PT services  ?PT Problem List Decreased  strength;Decreased range of motion;Decreased activity tolerance;Decreased balance;Decreased safety awareness ? ?   ?  ?PT Treatment Interventions DME instruction;Gait training;Stair training;Functional mobility training;Therapeutic activities;Therapeutic exercise;Balance training   ? ?PT Goals (Current goals can be found in the Care Plan section)  ?Acute Rehab PT Goals ?Patient Stated Goal: to eventually return home after surgery ?PT Goal Formulation: With patient ?Time For Goal Achievement: 07/04/21 ?Potential to Achieve Goals: Good ? ?  ?Frequency Min 3X/week ?  ? ? ?   ?AM-PAC PT "6 Clicks" Mobility  ?Outcome Measure Help needed turning from your back to your side while in a flat bed without using bedrails?: None ?Help needed moving from lying on your back to sitting on the side of a flat bed without using bedrails?: A Little ?Help needed moving to and from a bed to a chair (including a wheelchair)?: A Little ?Help needed standing up from a chair using your arms (e.g., wheelchair or bedside chair)?: A Little ?Help needed to walk in hospital room?: Total ?Help needed climbing 3-5 steps with a railing? : Total ?6 Click Score: 15 ? ?  ?End of Session Equipment Utilized During Treatment: Gait belt ?Activity Tolerance: Patient tolerated treatment well ?Patient left:  in bed;with call bell/phone within reach ?Nurse Communication: Mobility status ?PT Visit Diagnosis: Unsteadiness on feet (R26.81);Other abnormalities of gait and mobility (R26.89);Repeated falls (R29.6);Muscle weakness (generalized) (M62.81) ?  ? ?Time: 7026-3785 ?PT Time Calculation (min) (ACUTE ONLY): 33 min ? ? ?Charges:   PT Evaluation ?$PT Eval Low Complexity: 1 Low ?PT Treatments ?$Therapeutic Exercise: 8-22 mins ?  ?   ? ? ?West Carbo, PT, DPT  ? ?Acute Rehabilitation Department ?Pager #: [phone] ? ?Sandra Cockayne ?06/20/2021, 12:40 PM ?

## 2021-06-20 NOTE — H&P (Signed)
? ? ? ?Date: 06/20/2021     ?     ?     ?Patient Name:  Brent Griffin. MRN: [id number]  ?DOB: March 21, 1946 Age / Sex: 76 y.o., male   ?PCP: [name], PA-C    ?     ?Medical Service: Internal Medicine Teaching Service    ?     ?Attending Physician: Dr. Lottie Mussel, MD    ?First Contact: Dr. Rosezetta Schlatter, MD Pager: [phone]  ?Second Contact: Dr. Virl Axe, MD Pager: [phone]  ?     ?After Hours (After 5p/  First Contact Pager: [phone]  ?weekends / holidays): Second Contact Pager: [phone]  ? ?Chief Complaint: R ankle injury ? ?History of Present Illness: Brent Griffin is a 76 year old male with PMHx T2DM controlled with lifestyle, HTN, HLD, CKD Stage II, CAD s/p Catheterization, polymyalgia rheumatica on chronic steroids, and mild memory loss who presents at the advising of orthopedics for imaging of his R ankle for suspected fracture. Patient reports a fall 5 weeks ago and had since been weightbearing on his ankle; able to bear weight 2/2 neuropathy.  ? ?Has been having ankle pain and difficulty ambulating after sustaining an injury. Was reportedly seen at [facility] and had x-rays showing possible trimalleolar fracture. Came to [facility] for further evaluation. Had CT imaging confirming trimalleolar fracture and splint was placed. Physical Therapy worked with patient and found that he was an unsafe discharge (would be unable to climb stairs to get inside home, wife is unable to provide appropriate assistance at home) and recommended admission. Dr. Lucia Gaskins with [facility] recommended admission to hospital with plan for surgery on Monday or Tuesday. IMTS requested to admit for medical management. ? ? ?Had a stent placed in heart 01/2021. Denies chest pain, N/V, SHOB, changes in urination, changes in bowel movements, fevers, chills, hematuria. ? ?Meds:  ?Current Meds  ?Medication Sig  ? amLODipine (NORVASC) 10 MG tablet Take 10 mg by mouth daily.  ? aspirin EC 81 MG tablet Take 81 mg by  mouth daily. Swallow whole.  ? Cinnamon 500 MG capsule Take 500 mg by mouth daily.  ? clopidogrel (PLAVIX) 75 MG tablet Take 75 mg by mouth daily.  ? diphenhydramine-acetaminophen (TYLENOL PM) 25-500 MG TABS tablet Take 2-3 tablets by mouth at bedtime as needed (sleep).  ? doxycycline (VIBRA-TABS) 100 MG tablet Take 100 mg by mouth daily as needed (roscea outbreak).  ? folic acid (FOLVITE) 1 MG tablet Take 1 mg by mouth daily.  ? loperamide (IMODIUM) 2 MG capsule Take 2 mg by mouth as needed for diarrhea or loose stools.  ? losartan (COZAAR) 100 MG tablet Take 100 mg by mouth daily.  ? memantine (NAMENDA) 5 MG tablet Take 5 mg by mouth See admin instructions. Qd x 3 weeks, then 1 bid  ? metoprolol succinate (TOPROL-XL) 100 MG 24 hr tablet Take 150 mg by mouth at bedtime.  ? Multiple Vitamin (MULTI-VITAMINS) TABS Take 1 tablet by mouth daily.  ? naproxen (NAPROSYN) 500 MG tablet Take 500 mg by mouth 2 (two) times daily as needed for moderate pain.  ? Omega 3 1000 MG CAPS Take 2,000 mg by mouth daily.  ? ONETOUCH DELICA LANCETS 76A MISC USE ONE LANCET DAILY TO TEST BLOOD GLUCOSE  ? ONETOUCH VERIO test strip USE ONE STRIP DAILY TO TEST BLOOD GLUCOSE.  ? potassium chloride SA (KLOR-CON) 20 MEQ tablet Take 20 mEq by mouth 2 (two) times daily.  ? predniSONE (  DELTASONE) 5 MG tablet Take 10 mg by mouth daily.  ? Probiotic Product (ALIGN PO) Take 1 tablet by mouth every evening.  ? rosuvastatin (CRESTOR) 20 MG tablet Take 20 mg by mouth at bedtime.  ? spironolactone (ALDACTONE) 25 MG tablet Take 12.5 mg by mouth daily.  ? tiZANidine (ZANAFLEX) 2 MG tablet Take 2 mg by mouth every 6 (six) hours as needed for muscle spasms.  ? traMADol (ULTRAM) 50 MG tablet Take 50 mg by mouth every 6 (six) hours as needed for moderate pain.  ? TURMERIC PO Take 1 tablet by mouth daily.  ? vitamin B-12 (CYANOCOBALAMIN) 1000 MCG tablet Take 1,000 mcg by mouth daily.  ? vitamin C (ASCORBIC ACID) 250 MG tablet Take 250 mg by mouth daily.  ? zinc  gluconate 50 MG tablet Take 50 mg by mouth daily.  ? ? ? ?Allergies: ?Allergies as of 06/19/2021 - Review Complete 06/19/2021  ?Allergen Reaction Noted  ? Donepezil Other (See Comments) 05/25/2021  ? ?Past Medical History:  ?Diagnosis Date  ? Arthritis   ? thumb right hand  ? Cancer [facility]) 2020  ? Prostrate- tx Radiation  ? Diabetes mellitus   ? type II, not taking medications - 03/31/20  ? History of kidney stones   ? x 3  ? Hyperlipidemia   ? Hypertension   ? Metatarsalgia   ? left foot  ? Substance abuse ([place]) 11-17-2005  ? former alcoholic   ? ? ?Family History: Mom- HTN ?Dad- Prostate cancer, kidney stones, HTN ? ?Social History: Lives with wife and dog. Has a daughter who stays in [place]. Smokes 2 cigarettes/ week some weeks. Hx of alcohol use; maintained sobriety since 2007.  ? ?Review of Systems: ?A complete ROS was negative except as per HPI.  ? ?Physical Exam: ?Blood pressure (!) 143/87, pulse 76, temperature 99.1 ?F (37.3 ?C), temperature source Oral, resp. rate 18, SpO2 97 %. ?Physical Exam: ?General: well-appearing elderly male in NAD ?HENT: normocephalic, atraumatic, external nares and ears appear normal ?EYES: conjunctiva non-erythematous, no scleral icterus ?CV: regular rate, normal rhythm, no murmurs, rubs, gallops. 2+ distal pulses (LLE) and radial pulses. No carotid turbulence.  ?Pulmonary: normal work of breathing on RA, lungs clear to auscultation bilaterally ?Abdominal: non-distended, soft, non-tender to palpation, normal BS all 4 quadrants ?Skin: Warm and dry; decreased skin turgor; RLE in stirrup splint ?Neurological: Awake, alert and oriented x3.  ?Psych: normal affect and behavior  ? ? ?Assessment & Plan by Problem: ?Principal Problem: ?  Trimalleolar fracture of ankle, closed, right, initial encounter ? ?#Trimalleolar fracture of R ankle ?Patient with fall 5 weeks ago, still weightbearing 2/2 neuropathy. Trimalleolar fracture confirmed by CT. Ortho reviewed, and Dr. Lucia Gaskins to complete  surgical procedure Monday or Tuesday. Due to steps to enter home and lack of assistance, unsafe for patient to d/c home so to be admitted. Advise caution with chronic steroid use as can increase risk of osteoporosis (No end date reported). Also advise strict diabetic monitoring and control for neuropathy. ?- Continue stirrup splint usage ?- Ortho following, to intervene Monday; appreciate recs. ?- PT following ? ?#CAD s/p cardiac cath ?#HTN ?Catheterization completed 01/2021. Patient reported BP largely stable at home. 24 hr SBP 143-156 ?- Resume home Norvasc 10 mg, ASA 81 mg, Plavix 75 mg, Cozaar 100 mg, Metoprolol XL 100 mg qHS ? ?#Polymyalgia Rheumatica ?#Chronic R knee pain ?- Continue home prednisone 10 mg  ? ?#Memory loss, mild ?MMSE 28/30 (09/2020) ?- Resume home memantine 5 mg ? ?#  CKD Stage G2/A2 ?- Avoid nephrotoxic agents ? ?#Hypokalemia, chronic ?K+ 3.4 on admission.  ?- Continue home meds: Spironolactone 12.5 mg, Klor-Con 20 mEq BID ? ?#HLD ?-Continue home Rosuvastatin 20 mg ? ?#Vitamin B-12 Deficiency ?- Continue home cyanocobalamin 1000 mcg  ? ?Dispo: Admit patient to Inpatient with expected length of stay greater than 2 midnights. ? ?Signed: ?Rosezetta Schlatter, MD ?06/20/2021, 4:37 PM  ?Pager: [phone] ?After 5pm on weekdays and 1pm on weekends: On Call pager: [phone]  ?

## 2021-06-21 DIAGNOSIS — S82851A Displaced trimalleolar fracture of right lower leg, initial encounter for closed fracture: Secondary | ICD-10-CM | POA: Diagnosis not present

## 2021-06-21 LAB — BASIC METABOLIC PANEL
Anion gap: 8 (ref 5–15)
BUN: 10 mg/dL (ref 8–23)
CO2: 27 mmol/L (ref 22–32)
Calcium: 8.6 mg/dL — ABNORMAL LOW (ref 8.9–10.3)
Chloride: 104 mmol/L (ref 98–111)
Creatinine, Ser: 0.8 mg/dL (ref 0.61–1.24)
GFR, Estimated: >60 mL/min (ref >60)
Glucose, Bld: 153 mg/dL — ABNORMAL HIGH (ref 70–99)
Potassium: 3.4 mmol/L — ABNORMAL LOW (ref 3.5–5.1)
Sodium: 139 mmol/L (ref 135–145)

## 2021-06-21 LAB — CBC
HCT: 41.8 % (ref 39.0–52.0)
Hemoglobin: 13.8 g/dL (ref 13.0–17.0)
MCH: 29.6 pg (ref 26.0–34.0)
MCHC: 33 g/dL (ref 30.0–36.0)
MCV: 89.7 fL (ref 80.0–100.0)
Platelets: 154 10*3/uL (ref 150–400)
RBC: 4.66 MIL/uL (ref 4.22–5.81)
RDW: 17.4 % — ABNORMAL HIGH (ref 11.5–15.5)
WBC: 8.9 10*3/uL (ref 4.0–10.5)
nRBC: 0 % (ref 0.0–0.2)

## 2021-06-21 LAB — PROTIME-INR
INR: 1 (ref 0.8–1.2)
Prothrombin Time: 13.4 seconds (ref 11.4–15.2)

## 2021-06-21 LAB — APTT: aPTT: 33 seconds (ref 24–36)

## 2021-06-21 MED ORDER — METOPROLOL SUCCINATE ER 50 MG PO TB24
50.0000 mg | ORAL_TABLET | Freq: Every day | ORAL | Status: DC
Start: 2021-06-21 — End: 2021-06-29
  Administered 2021-06-21 – 2021-06-28 (×8): 50 mg via ORAL
  Filled 2021-06-21 (×8): qty 1

## 2021-06-21 NOTE — Progress Notes (Signed)
Patient currently admitted with trimalleolar right ankle fracture. Patient was to follow-up outpatient with Dr. Lucia Griffin, however, patient did not tolerate being NWB. Patient discussed with Dr. Lucia Griffin, and current plan is for Dr. Lucia Griffin to proceed with ORIF of the Brent Griffin's ankle fracture Monday OR Tuesday. For now, patient has been made NPO after midnight tonight. Additional more specific recommendations will be forthcoming by Dr. Lucia Griffin.   ?

## 2021-06-21 NOTE — Progress Notes (Signed)
Asked to review ECG. ?Appears to show sinus rhythm with second degree sinoatrial block Mobitz type 1 with 4:3 and 3:2 cycles. No bradycardia. Reported to be asymptomatic. ?On Metoprolol and Memantine. ?Advise reducing the metoprolol succinate to 50 mg daily, but should not hold the beta blocker completely in view of upcoming noncardiac surgery. ? ?Also discussed antiplatelet therapy. He received a drug-eluting stent in an elective procedure (not ACS) on January 09, 2021. It is preferable to continue dual antiplatelet therapy for a total of 6 months post PCI, but briefly interrupting DAPT for surgery should be low risk. ?

## 2021-06-21 NOTE — Progress Notes (Signed)
? ?  Subjective:  ? ?Doing well this morning, just bored while awaiting surgery. Denies any chest pain, SHOB, foot pain. ? ?States that he fell out of his bed (is higher in elevation than his current hospital bed) and fell on foot. ? ?Discussed plan for surgery today or tomorrow per Dr. Lucia Gaskins. ? ?Objective: ? ?Vital signs in last 24 hours: ?Vitals:  ? 06/20/21 1730 06/20/21 2000 06/20/21 2043 06/21/21 0802  ?BP: (!) 141/86 139/90 136/90 139/90  ?Pulse: 76 90 87 (!) 52  ?Resp:  '17 17 18  '$ ?Temp:   97.9 ?F (36.6 ?C) 98 ?F (36.7 ?C)  ?TempSrc:   Oral Oral  ?SpO2: 96% 94% 95% 97%  ? ?Physical Exam: ?General: well-appearing elderly male, lying in bed, NAD. ?CV: normal rate but irregular rhythm, no m/r/g.  ?Pulm: normal work of breathing on RA, lungs CTABL. ?Skin: warm and dry, RLE in stirrup splint ?Neuro: AAOx3, no focal deficits noted. Numbness to sensation testing in distal R great toe (plantar surface) but otherwise normal.  ?Psych: normal affect and mood. ? ?Assessment/Plan: ? ?Principal Problem: ?  Trimalleolar fracture of ankle, closed, right, initial encounter ? ?Trimalleolar fracture of R ankle ?Confirmed by CT. Ortho, Dr. Lucia Gaskins, to perform operative repair Monday or Tuesday. Close to ground level fall (off of bed) so likely has osteoporosis, will need outpatient follow up for this. Right extremity with no pain and in stirrup splint. He is NPO at midnight for possible procedure tomorrow. Per cardiology, patient received DES in an elective procedure (not ACS) in 01/2021 and thus it is low risk to briefly hold DAPT for surgery. Will hold DAPT and lovenox ppx with plans to restart once orthopedics recommends. ?-appreciate ortho assistance ?-NPO '@[place]'$  ?-will need PT following operative repair to determine placement (patient okay with short term rehab if needed) ?-holding DAPT and lovenox ppx in anticipation for surgery ? 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 He was asymptomatic. EKG showing 2nd degree SA block Mobitz type 1 with 4:3 and 3:2 cycles. Discussed with cardiology, who advised to reduce home metoprolol dose to '50mg'$  qhs. ?-greatly appreciate Dr. Victorino December assistance ?-reduced home toprol-xl to '50mg'$  qhs ?-started telemetry ? ?CAD s/p cardiac cath ?HTN ?Cardiac cath with stent placement in 01/2021. Done as elective procedure. Resumed home norvasc, losartan, and spironolactone. Decreased toprol-xl dose to '50mg'$  qhs (see above). Holding DAPT in anticipation for upcoming surgery (see above). ? ?PMR ?Chronic R knee pain ?-continue home prednisone '10mg'$  daily ? 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 ?-continue home memantine '5mg'$  daily ? ?Hypokalemia, chronic ?States this is a chronic issue for him. Takes spironolactone and potassium supplementation at home for this. ?-continue spironolactone ?-continue home klor-con 33mq BID ? ?Prior to Admission Living Arrangement: Home ?Anticipated Discharge Location: TBD ?Barriers to Discharge: need for operative repair ?Dispo: Anticipated discharge in approximately 2-3 day(s).  ? ?JVirl Axe MD ?06/21/2021, 12:45 PM ?Pager: [phone]?After 5pm on weekdays and 1pm on weekends: On Call pager [phone]? ?

## 2021-06-22 ENCOUNTER — Other Ambulatory Visit: Payer: Self-pay | Admitting: Orthopaedic Surgery

## 2021-06-22 DIAGNOSIS — S82851A Displaced trimalleolar fracture of right lower leg, initial encounter for closed fracture: Secondary | ICD-10-CM | POA: Diagnosis not present

## 2021-06-22 NOTE — Plan of Care (Signed)
  Problem: Education: Goal: Knowledge of General Education information will improve Description: Including pain rating scale, medication(s)/side effects and non-pharmacologic comfort measures Outcome: Progressing   Problem: Activity: Goal: Risk for activity intolerance will decrease Outcome: Progressing   Problem: Nutrition: Goal: Adequate nutrition will be maintained Outcome: Progressing   

## 2021-06-22 NOTE — Consult Note (Addendum)
Reason for Consult:Subacute Right trimalleolar ankle fracture ? ? ?Brent Griffin. is an 76 y.o. male.  ?HPI: Patient presented to the emergency department on 06/19/2021 for evaluation and management of right ankle injury.  Apparently 5 weeks ago he suffered a trimalleolar ankle fracture and was evaluated at [facility].  He was subsequently placed into a boot but had difficulty maintaining nonweightbearing.  He is fearful of reinjury. He is unable to use crutches and as a result does not feel comfortable being discharged home at this point for outpatient management. He would like to discuss operative management as definitive treatment. He states he does not have any significant pain in terms of his right ankle at this point.  However, he has done some research into his injury and again would like to proceed with operative management.  He is on Plavix for heart stent, and this was held on yesterday following cardiac clearance.  He is a type II diabetic as well with A1c of 6.0 3 weeks ago.He also tells me he would like to plan to go to a rehab facility postoperatively. ? ?Past Medical History:  ?Diagnosis Date  ? Arthritis   ? thumb right hand  ? Cancer [facility]) 2020  ? Prostrate- tx Radiation  ? Diabetes mellitus   ? type II, not taking medications - 03/31/20  ? History of kidney stones   ? x 3  ? Hyperlipidemia   ? Hypertension   ? Metatarsalgia   ? left foot  ? 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 ? ?Past Surgical History:  ?Procedure Laterality Date  ? COLONOSCOPY    ? FOOT FRACTURE SURGERY Bilateral   ? FOOT SURGERY Left 12/2018  ? LITHOTRIPSY    ? [place]  ? ORIF ELBOW FRACTURE Left 04/01/2020  ? Procedure: OPEN REDUCTION INTERNAL FIXATION (ORIF) SUPRACONDYLAR ELBOW  FRACTURE, OLECRANON OSTEOTOMY AND BURSECTOMY;  Surgeon: Roseanne Kaufman, MD;  Location: [place];  Service: Orthopedics;  Laterality: Left;  ? ROTATOR CUFF REPAIR Right 2018  ? ULNAR NERVE TRANSPOSITION Left 04/01/2020   ? Procedure: ULNAR NERVE RELEASE AND TRANSPOSITION;  Surgeon: Roseanne Kaufman, MD;  Location: [place];  Service: Orthopedics;  Laterality: Left;  ? VASECTOMY  1984  ? ? ?Family History  ?Problem Relation Age of Onset  ? Colon cancer Other 64  ? Prostate cancer Father   ? Diabetes Mellitus II Father   ? Liver disease Neg Hx   ? Esophageal cancer Neg Hx   ? Pancreatic cancer Neg Hx   ? Stomach cancer Neg Hx   ? ? ?Social History:  reports that he has been smoking cigarettes. He has never used smokeless tobacco. He reports that he does not drink alcohol and does not use drugs. ? ?Allergies:  ?Allergies  ?Allergen Reactions  ? Donepezil Other (See Comments)  ?  Felt weird.   ? ? ?Medications: I have reviewed the patient's current medications. ? ?Results for orders placed or performed during the hospital encounter of 06/19/21 (from the past 48 hour(s))  ?CBC with Differential     Status: Abnormal  ? Collection Time: 06/20/21  1:43 PM  ?Result Value Ref Range  ? WBC 5.5 4.0 - 10.5 K/uL  ? RBC 5.06 4.22 - 5.81 MIL/uL  ? Hemoglobin 14.7 13.0 - 17.0 g/dL  ? HCT 46.6 39.0 - 52.0 %  ? MCV 92.1 80.0 - 100.0 fL  ? MCH 29.1 26.0 - 34.0 pg  ? MCHC 31.5  30.0 - 36.0 g/dL  ? RDW 17.6 (H) 11.5 - 15.5 %  ? Platelets 160 150 - 400 K/uL  ? nRBC 0.0 0.0 - 0.2 %  ? Neutrophils Relative % 78 %  ? Neutro Abs 4.3 1.7 - 7.7 K/uL  ? Lymphocytes Relative 12 %  ? Lymphs Abs 0.7 0.7 - 4.0 K/uL  ? Monocytes Relative 8 %  ? Monocytes Absolute 0.4 0.1 - 1.0 K/uL  ? Eosinophils Relative 1 %  ? Eosinophils Absolute 0.1 0.0 - 0.5 K/uL  ? Basophils Relative 1 %  ? Basophils Absolute 0.0 0.0 - 0.1 K/uL  ? Immature Granulocytes 0 %  ? Abs Immature Granulocytes 0.01 0.00 - 0.07 K/uL  ?  Comment: Performed at [facility], [place] [address], [place], [place] [address]  ?Comprehensive metabolic panel     Status: Abnormal  ? Collection Time: 06/20/21  1:43 PM  ?Result Value Ref Range  ? Sodium 140 135 - 145 mmol/L  ? Potassium 3.4 (L) 3.5 - 5.1 mmol/L  ?  Chloride 103 98 - 111 mmol/L  ? CO2 27 22 - 32 mmol/L  ? Glucose, Bld 137 (H) 70 - 99 mg/dL  ?  Comment: Glucose reference range applies only to samples taken after fasting for at least 8 hours.  ? BUN 6 (L) 8 - 23 mg/dL  ? Creatinine, Ser 0.94 0.61 - 1.24 mg/dL  ? Calcium 9.1 8.9 - 10.3 mg/dL  ? Total Protein 6.7 6.5 - 8.1 g/dL  ? Albumin 3.2 (L) 3.5 - 5.0 g/dL  ? AST 22 15 - 41 U/L  ? ALT 19 0 - 44 U/L  ? Alkaline Phosphatase 116 38 - 126 U/L  ? Total Bilirubin 0.8 0.3 - 1.2 mg/dL  ? GFR, Estimated >60 >60 mL/min  ?  Comment: (NOTE) ?Calculated using the CKD-EPI Creatinine Equation (2021) ?  ? Anion gap 10 5 - 15  ?  Comment: Performed at [facility], [place] [address], [place], [place] [address]  ?Protime-INR     Status: None  ? Collection Time: 06/20/21  1:43 PM  ?Result Value Ref Range  ? Prothrombin Time 13.1 11.4 - 15.2 seconds  ? INR 1.0 0.8 - 1.2  ?  Comment: (NOTE) ?INR goal varies based on device and disease states. ?Performed at [facility], [place] [address], [place], [place] ?[address] ?  ?Resp Panel by RT-PCR (Flu A&B, Covid) Nasopharyngeal Swab     Status: None  ? Collection Time: 06/20/21  1:53 PM  ? Specimen: Nasopharyngeal Swab; Nasopharyngeal(NP) swabs in vial transport medium  ?Result Value Ref Range  ? SARS Coronavirus 2 by RT PCR NEGATIVE NEGATIVE  ?  Comment: (NOTE) ?SARS-CoV-2 target nucleic acids are NOT DETECTED. ? ?The SARS-CoV-2 RNA is generally detectable in upper respiratory ?specimens during the acute phase of infection. The lowest ?concentration of SARS-CoV-2 viral copies this assay can detect is ?138 copies/mL. A negative result does not preclude SARS-Cov-2 ?infection and should not be used as the sole basis for treatment or ?other patient management decisions. A negative result may occur with  ?improper specimen collection/handling, submission of specimen other ?than nasopharyngeal swab, presence of viral mutation(s) within the ?areas targeted by this assay, and inadequate  number of viral ?copies(<138 copies/mL). A negative result must be combined with ?clinical observations, patient history, and epidemiological ?information. The expected result is Negative. ? ?Fact Sheet for Patients:  ?EntrepreneurPulse.com.au ? ?Fact Sheet for Healthcare Providers:  ?IncredibleEmployment.be ? ?  This test is no t yet approved or cleared by the [place] FDA and  ?has been authorized for detection and/or diagnosis of SARS-CoV-2 by ?FDA under an Emergency Use Authorization (EUA). This EUA will remain  ?in effect (meaning this test can be used) for the duration of the ?COVID-19 declaration under Section 564(b)(1) of the Act, 21 ?U.S.C.section 360bbb-3(b)(1), unless the authorization is terminated  ?or revoked sooner.  ? ? ?  ? Influenza A by PCR NEGATIVE NEGATIVE  ? Influenza B by PCR NEGATIVE NEGATIVE  ?  Comment: (NOTE) ?The Xpert Xpress SARS-CoV-2/FLU/RSV plus assay is intended as an aid ?in the diagnosis of influenza from Nasopharyngeal swab specimens and ?should not be used as a sole basis for treatment. Nasal washings and ?aspirates are unacceptable for Xpert Xpress SARS-CoV-2/FLU/RSV ?testing. ? ?Fact Sheet for Patients: ?EntrepreneurPulse.com.au ? ?Fact Sheet for Healthcare Providers: ?IncredibleEmployment.be ? ?This test is not yet approved or cleared by the [place] FDA and ?has been authorized for detection and/or diagnosis of SARS-CoV-2 by ?FDA under an Emergency Use Authorization (EUA). This EUA will remain ?in effect (meaning this test can be used) for the duration of the ?COVID-19 declaration under Section 564(b)(1) of the Act, 21 U.S.C. ?section 360bbb-3(b)(1), unless the authorization is terminated or ?revoked. ? ?Performed at [facility], [place] [address], [place], [place] ?[address] ?  ?CBC     Status: Abnormal  ? Collection Time: 06/21/21  1:58 AM  ?Result Value Ref Range  ? WBC 8.9 4.0 - 10.5 K/uL  ?  RBC 4.66 4.22 - 5.81 MIL/uL  ? Hemoglobin 13.8 13.0 - 17.0 g/dL  ? HCT 41.8 39.0 - 52.0 %  ? MCV 89.7 80.0 - 100.0 fL  ? MCH 29.6 26.0 - 34.0 pg  ? MCHC 33.0 30.0 - 36.0 g/dL  ? RDW 17.4 (H) 11.5 - 15.5

## 2021-06-22 NOTE — NC FL2 (Signed)
?[place] MEDICAID FL2 LEVEL OF CARE SCREENING TOOL  ?  ? ?IDENTIFICATION  ?Patient Name: ?Brent Griffin. Birthdate: 04-29-1945 Sex: male Admission Date (Current Location): ?06/19/2021  ?[place] and [place] Number: ?   ?  Facility and Address:  ?  ?     Provider Number: ?   ?Attending Physician Name and Address:  ?Lottie Mussel, MD ? Relative Name and Phone Number:  ?Myles Tavella, [phone] ?   ?Current Level of Care: ?Hospital Recommended Level of Care: ?[place] Prior Approval Number: ?  ? ?Date Approved/Denied: ?  PASRR Number: ?[id number] A ? ?Discharge Plan: ?SNF ?  ? ?Current Diagnoses: ?Patient Active Problem List  ? Diagnosis Date Noted  ? Trimalleolar fracture of ankle, closed, right, initial encounter 06/20/2021  ? Diarrhea 06/06/2020  ? Change in bowel habits 06/06/2020  ? Surgery, elective   ? Acute metabolic encephalopathy 81/85/6314  ? Closed fracture of left distal humerus 04/01/2020  ? Hypokalemia 03/13/2018  ? Adjustment insomnia 03/10/2018  ? Renal insufficiency 03/10/2018  ? Tobacco use disorder 03/10/2018  ? Neck pain 12/01/2017  ? 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 Urinary hesitancy 09/08/2017  ? Prostate cancer ([place]) 01/16/2016  ? Hyperlipidemia LDL goal <100 08/27/2014  ? Hyperlipidemia associated with type 2 diabetes mellitus ([place]) 08/27/2014  ? Muscle spasm of back 02/13/2014  ? Rosacea 02/13/2014  ? Scoliosis 02/13/2014  ? Essential hypertension 02/12/2014  ? Elevated prostate specific antigen (PSA) 01/06/2014  ? Muscle spasms of neck 12/17/2013  ? Type II diabetes mellitus ([place]) 05/13/2013  ? ? ?Orientation RESPIRATION BLADDER Height & Weight   ?  ?Self, Time, Situation, Place ? Normal Continent Weight:   ?Height:     ?BEHAVIORAL SYMPTOMS/MOOD NEUROLOGICAL BOWEL NUTRITION STATUS  ?    Continent Diet (See DC summary)  ?AMBULATORY STATUS COMMUNICATION OF NEEDS Skin   ?Extensive Assist Verbally Normal ?  ?  ?  ?    ?     ?      ? ? ?Personal Care Assistance Level of Assistance  ?Bathing, Feeding, Dressing Bathing Assistance: Limited assistance ?Feeding assistance: Independent ?Dressing Assistance: Limited assistance ?   ? ?Functional Limitations Info  ?Sight, Hearing, Speech Sight Info: Adequate ?Hearing Info: Adequate ?Speech Info: Adequate  ? ? ?SPECIAL CARE FACTORS FREQUENCY  ?PT (By licensed PT), OT (By licensed OT)   ?  ?PT Frequency: 5x week ?OT Frequency: 5x week ?  ?  ?  ?   ? ? ?Contractures Contractures Info: Not present  ? ? ?Additional Factors Info  ?Code Status, Allergies Code Status Info: Full ?Allergies Info: Donepezil ?  ?  ?  ?   ? ?Current Medications (06/22/2021):  This is the current hospital active medication list ?Current Facility-Administered Medications  ?Medication Dose Route Frequency Provider Last Rate Last Admin  ? acetaminophen (TYLENOL) tablet 650 mg  650 mg Oral Q6H PRN Virl Axe, MD      ? Or  ? acetaminophen (TYLENOL) suppository 650 mg  650 mg Rectal Q6H PRN Virl Axe, MD      ? amLODipine (NORVASC) tablet 10 mg  10 mg Oral Daily Virl Axe, MD   10 mg at 06/22/21 0825  ? losartan (COZAAR) tablet 100 mg  100 mg Oral Daily Virl Axe, MD   100 mg at 06/22/21 0825  ? melatonin tablet 3 mg  3 mg Oral QHS PRN Jose Persia, MD   3 mg at 06/21/21 2133  ? memantine (NAMENDA) tablet 5  mg  5 mg Oral Daily Virl Axe, MD   5 mg at 06/22/21 5093  ? metoprolol succinate (TOPROL-XL) 24 hr tablet 50 mg  50 mg Oral QHS Virl Axe, MD   50 mg at 06/21/21 2132  ? nicotine (NICODERM CQ - dosed in mg/24 hours) patch 14 mg  14 mg Transdermal Daily Virl Axe, MD      ? potassium chloride SA (KLOR-CON M) CR tablet 20 mEq  20 mEq Oral BID Virl Axe, MD   20 mEq at 06/22/21 0825  ? predniSONE (DELTASONE) tablet 10 mg  10 mg Oral Daily Virl Axe, MD   10 mg at 06/22/21 2671  ? rosuvastatin (CRESTOR) tablet 20 mg  20 mg Oral QHS Virl Axe, MD   20 mg at 06/21/21 2133  ?  spironolactone (ALDACTONE) tablet 12.5 mg  12.5 mg Oral Daily Virl Axe, MD   12.5 mg at 06/22/21 0825  ? tiZANidine (ZANAFLEX) tablet 2 mg  2 mg Oral Q6H PRN Virl Axe, MD      ? vitamin B-12 (CYANOCOBALAMIN) tablet 1,000 mcg  1,000 mcg Oral Daily Virl Axe, MD   1,000 mcg at 06/22/21 2458  ? ? ? ?Discharge Medications: ?Please see discharge summary for a list of discharge medications. ? ?Relevant Imaging Results: ? ?Relevant Lab Results: ? ? ?Additional Information ?SS# [phone] ? ?Coralee Pesa, LCSWA ? ? ? ? ?

## 2021-06-22 NOTE — Progress Notes (Signed)
? ? ?HD#2 ?Subjective:  ?Overnight Events: NAEO ? ? Patient is frustrated about the wait. He hopes to be able to have the procedure today. He denies any pain. Says that he is just bored being in the hospital. ? ?Objective:  ?Vital signs in last 24 hours: ?Vitals:  ? 06/21/21 1520 06/21/21 2132 06/22/21 0615 06/22/21 0748  ?BP: 136/80 132/68 124/74 134/77  ?Pulse:  67 61 65  ?Resp:  '17 18 17  '$ ?Temp: 98.4 ?F (36.9 ?C) 98.3 ?F (36.8 ?C) 97.7 ?F (36.5 ?C) (!) 97.5 ?F (36.4 ?C)  ?TempSrc: Oral Oral Oral Oral  ?SpO2: 97% 98% 99% 97%  ? ?Supplemental O2: Room Air ?SpO2: 97 % ? ? ?Physical Exam:  ?Constitutional: elderly man resting comfortably in bed in no acute distress ?HEENT: normocephalic atraumatic, mucous membranes moist , conjunctiva non-erythematous ?Cardiovascular: irregular, no m/r/g ?Pulmonary/Chest: normal work of breathing on room air, lungs clear to auscultation bilaterally  ?Abdominal: soft, non-tender, non-distended ?MSK: RLE wrapping c/d/i ?Neurological: alert, answering questions appropriately ?Skin: warm and dry ?Psych: normal affect ? ?There were no vitals filed for this visit. ? ? ?Intake/Output Summary (Last 24 hours) at 06/22/2021 0956 ?Last data filed at 06/22/2021 [id number] ?Gross per 24 hour  ?Intake --  ?Output 675 ml  ?Net -675 ml  ? ?Net IO Since Admission: -1,175 mL [06/22/21 0956] ? ?Pertinent Labs: ?CBC Latest Ref Rng & Units 06/21/2021 06/20/2021 04/19/2021  ?WBC 4.0 - 10.5 K/uL 8.9 5.5 8.1  ?Hemoglobin 13.0 - 17.0 g/dL 13.8 14.7 14.2  ?Hematocrit 39.0 - 52.0 % 41.8 46.6 42.5  ?Platelets 150 - 400 K/uL 154 160 223  ? ? ?CMP Latest Ref Rng & Units 06/21/2021 06/20/2021 04/19/2021  ?Glucose 70 - 99 mg/dL 153(H) 137(H) 107(H)  ?BUN 8 - 23 mg/dL 10 6(L) 14  ?Creatinine 0.61 - 1.24 mg/dL 0.80 0.94 0.70  ?Sodium 135 - 145 mmol/L 139 140 137  ?Potassium 3.5 - 5.1 mmol/L 3.4(L) 3.4(L) 3.9  ?Chloride 98 - 111 mmol/L 104 103 101  ?CO2 22 - 32 mmol/L '27 27 26  '$ ?Calcium 8.9 - 10.3 mg/dL 8.6(L) 9.1 9.1  ?Total  Protein 6.5 - 8.1 g/dL - 6.7 -  ?Total Bilirubin 0.3 - 1.2 mg/dL - 0.8 -  ?Alkaline Phos 38 - 126 U/L - 116 -  ?AST 15 - 41 U/L - 22 -  ?ALT 0 - 44 U/L - 19 -  ? ? ?Imaging: ?No results found. ? ?Assessment/Plan:  ? ?Principal Problem: ?  Trimalleolar fracture of ankle, closed, right, initial encounter ? ? ?Patient Summary: ?Brent Griffin. 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 ? ?Trimalleolar fracture of R ankle ?Patient is still NPO, ortho has not yet dropped a note today. Will reach out to them to figure out timeline on procedure and either keep patient NPO or give him a diet. Will continue hold DAPT ?-NPO '@[place]'$  ?- f/u procedure dates ?-will need PT following operative repair to determine placement (patient okay with short term rehab if needed) ?-holding DAPT and lovenox ppx in anticipation for surgery ? ?Suspected osteoporosis ?Patient with a ground level fall and fracture indicating osteoporosis. Will need to follow up with PCP as an outpatient. This is made higher risk by patient's use of chronic steroids for PMR ?- outpatient follow up ? 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 He was asymptomatic. EKG showing 2nd degree SA block Mobitz type 1 with 4:3 and 3:2 cycles. Discussed with cardiology, who advised to reduce home metoprolol dose to '50mg'$  qhs. ?-greatly appreciate Dr. Victorino December assistance ?-reduced home toprol-xl to '50mg'$  qhs ?-d/c telemetry ?  ?CAD s/p cardiac cath ?HTN ?Cardiac cath with stent placement in 01/2021. Done as elective procedure. Resumed home norvasc, losartan, and spironolactone. Decreased toprol-xl dose to '50mg'$  qhs (see above). Holding DAPT in anticipation for upcoming surgery (see above). ?   ?PMR ?Chronic R knee pain ?-continue home prednisone '10mg'$  daily ?  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 ?-continue home memantine '5mg'$  daily ?  ?Hypokalemia, chronic ?States this is a chronic issue for him. Takes spironolactone and potassium supplementation at home for this. ?-continue spironolactone ?-continue home klor-con 55mq BID ?  ?Prior to Admission Living Arrangement: Home ?Anticipated Discharge Location: TBD ?Barriers to Discharge: need for operative repair ?Dispo: Anticipated discharge in approximately 2-3 day(s).  ? ?AScarlett Presto MD ?Internal Medicine Resident PGY-1 ?Pager [phone]?Please contact the on call pager after 5 pm and on weekends at [phone] ? ?

## 2021-06-22 NOTE — Progress Notes (Signed)
Physical Therapy Treatment ?Patient Details ?Name: Brent Griffin. ?MRN: [id number] ?DOB: 1945-09-13 ?Today's Date: 06/22/2021 ? ? ?History of Present Illness Pt is a 76 y.o. male admitted 06/19/21 with ongoing R ankle pain after fall 5 wks ago; pt had been walking on foot until seen by outpatient ortho which revealed trimalleolar fx, plan for non-operative management with NWB; pt now requesting operative management. Per ortho, plan for open tx of R trimalleolar fx with possible syndesmosis fixation on 3/23. PMH includes arthritis, cancer, DM2, HLD, HTN. ?  ?PT Comments  ? ? Pt progressing with mobility. Today's session focused on transfer training and therex; pt requiring consistent assist to maintain balance and RLE NWB with standing activity. Pt motivated to participate and regain PLOF; pt remains limited by generalized weakness, decreased activity tolerance, and poor balance strategies/postural reactions. Continue to recommend SNF-level therapies to maximize functional mobility and independence prior to return home. ?   ?Recommendations for follow up therapy are one component of a multi-disciplinary discharge planning process, led by the attending physician.  Recommendations may be updated based on patient status, additional functional criteria and insurance authorization. ? ?Follow Up Recommendations ? Skilled nursing-short term rehab (<3 hours/day) ?  ?  ?Assistance Recommended at Discharge Frequent or constant Supervision/Assistance  ?Patient can return home with the following A lot of help with walking and/or transfers;A little help with bathing/dressing/bathroom;Assist for transportation;Help with stairs or ramp for entrance ?  ?Equipment Recommendations ? BSC/3in1  ?  ?Recommendations for Other Services   ? ? ?  ?Precautions / Restrictions Precautions ?Precautions: Fall ?Required Braces or Orthoses: Splint/Cast ?Splint/Cast: R ankle ?Restrictions ?Weight Bearing Restrictions: Yes ?RLE Weight Bearing: Non  weight bearing  ?  ? ?Mobility ? Bed Mobility ?Overal bed mobility: Modified Independent ?  ?  ?  ?  ?  ?  ?  ?  ? ?Transfers ?Overall transfer level: Needs assistance ?Equipment used: Rolling walker (2 wheels) ?Transfers: Sit to/from Stand ?Sit to Stand: Min assist ?  ?  ?  ?  ?  ?General transfer comment: Initial minA for trnk elevation standing from EOB to RW; min guard from recliner with heavy reliance on UE support; Cues for sequencing; minA for assist to ensure RLE NWB; repeated sit<>stands from recliner ?  ? ?Ambulation/Gait ?Ambulation/Gait assistance: Min guard, Min assist ?Gait Distance (Feet): 8 Feet (+ 6' + 4') ?Assistive device: Rolling walker (2 wheels) ?  ?Gait velocity: Decreased ?  ?  ?General Gait Details: Hop-to pattern on LLE with RW requiring frequent cues on technique to maintain RLE NWB, intermittent minA for stability and RW management; frequent seated rest breaks secondary to fatigue (RLE > LLE/BUEs) ? ? ?Stairs ?  ?  ?  ?  ?  ? ? ?Wheelchair Mobility ?  ? ?Modified Rankin (Stroke Patients Only) ?  ? ? ?  ?Balance Overall balance assessment: Needs assistance, History of Falls ?Sitting-balance support: No upper extremity supported, Feet supported ?Sitting balance-Leahy Scale: Good ?  ?  ?Standing balance support: Reliant on assistive device for balance ?Standing balance-Leahy Scale: Poor ?  ?  ?  ?  ?  ?  ?  ?  ?  ?  ?  ?  ?  ? ?  ?Cognition Arousal/Alertness: Awake/alert ?Behavior During Therapy: [facility] for tasks assessed/performed ?Overall Cognitive Status: Impaired/Different from baseline ?Area of Impairment: Attention, Safety/judgement, Memory ?  ?  ?  ?  ?  ?  ?  ?  ?  ?Current Attention Level: Selective ?  ?  ?  Safety/Judgement: Decreased awareness of safety, Decreased awareness of deficits ?  ?  ?General Comments: pt repetitive with same questions despite education (i.e. telling therapist he thinks he needs rehab, although educ on plan for this during PT eval and discussions with SW);  repeated cues for importance of R foot NWB ?  ?  ? ?  ?Exercises Other Exercises ?Other Exercises: 8x repeated sit<>stand from recliner, [place], seated march ?Other Exercises: [employer] HEP handout provided (Access Code [email or web address]) and educ on -- clarified that majority of pictures demo RLE WB on bed/ground, but clarified verbally and written instructions that pt to keep R foot NWB; pt reports understanding ? ?  ?General Comments   ?  ?  ? ?Pertinent Vitals/Pain Pain Assessment ?Pain Assessment: No/denies pain  ? ? ?Home Living   ?  ?  ?  ?  ?  ?  ?  ?  ?  ?   ?  ?Prior Function    ?  ?  ?   ? ?PT Goals (current goals can now be found in the care plan section) Progress towards PT goals: Progressing toward goals ? ?  ?Frequency ? ? ? Min 3X/week ? ? ? ?  ?PT Plan Current plan remains appropriate  ? ? ?Co-evaluation   ?  ?  ?  ?  ? ?  ?AM-PAC PT "6 Clicks" Mobility   ?Outcome Measure ? Help needed turning from your back to your side while in a flat bed without using bedrails?: None ?Help needed moving from lying on your back to sitting on the side of a flat bed without using bedrails?: A Little ?Help needed moving to and from a bed to a chair (including a wheelchair)?: A Little ?Help needed standing up from a chair using your arms (e.g., wheelchair or bedside chair)?: A Little ?Help needed to walk in hospital room?: Total ?Help needed climbing 3-5 steps with a railing? : Total ?6 Click Score: 15 ? ?  ?End of Session Equipment Utilized During Treatment: Gait belt ?Activity Tolerance: Patient tolerated treatment well ?Patient left: in chair;with call bell/phone within reach ?Nurse Communication: Mobility status ?PT Visit Diagnosis: Unsteadiness on feet (R26.81);Other abnormalities of gait and mobility (R26.89);Repeated falls (R29.6);Muscle weakness (generalized) (M62.81) ?  ? ? ?Time: 0109-3235 ?PT Time Calculation (min) (ACUTE ONLY): 27 min ? ?Charges:  $Therapeutic Exercise: 8-22 mins ?$Therapeutic Activity: 8-22 mins           ?          ? ?Mabeline Caras, PT, DPT ?Acute Rehabilitation Services  ?Pager [phone] ?Office [phone] ? ?Derry Lory ?06/22/2021, 4:14 PM ? ?

## 2021-06-23 DIAGNOSIS — S82851A Displaced trimalleolar fracture of right lower leg, initial encounter for closed fracture: Secondary | ICD-10-CM | POA: Diagnosis not present

## 2021-06-23 LAB — BASIC METABOLIC PANEL
Anion gap: 8 (ref 5–15)
BUN: 18 mg/dL (ref 8–23)
CO2: 24 mmol/L (ref 22–32)
Calcium: 8.5 mg/dL — ABNORMAL LOW (ref 8.9–10.3)
Chloride: 105 mmol/L (ref 98–111)
Creatinine, Ser: 0.88 mg/dL (ref 0.61–1.24)
GFR, Estimated: >60 mL/min (ref >60)
Glucose, Bld: 87 mg/dL (ref 70–99)
Potassium: 4 mmol/L (ref 3.5–5.1)
Sodium: 137 mmol/L (ref 135–145)

## 2021-06-23 LAB — SURGICAL PCR SCREEN
MRSA, PCR: NEGATIVE
Staphylococcus aureus: POSITIVE — AB

## 2021-06-23 MED ORDER — MUPIROCIN 2 % EX OINT
1.0000 "application " | TOPICAL_OINTMENT | Freq: Two times a day (BID) | CUTANEOUS | Status: AC
  Administered 2021-06-23 – 2021-06-28 (×10): 1 via NASAL
  Filled 2021-06-23 (×3): qty 22

## 2021-06-23 MED ORDER — ENOXAPARIN SODIUM 40 MG/0.4ML IJ SOSY
40.0000 mg | PREFILLED_SYRINGE | INTRAMUSCULAR | Status: DC
  Administered 2021-06-23: 40 mg via SUBCUTANEOUS
  Filled 2021-06-23: qty 0.4

## 2021-06-23 MED ORDER — CHLORHEXIDINE GLUCONATE CLOTH 2 % EX PADS
6.0000 | MEDICATED_PAD | Freq: Every day | CUTANEOUS | Status: AC
  Administered 2021-06-24 – 2021-06-26 (×3): 6 via TOPICAL

## 2021-06-23 NOTE — Progress Notes (Signed)
Lovenox '40mg'$  SQ qday for DVT px. ? ?Onnie Boer, PharmD, BCIDP, AAHIVP, CPP ?Infectious Disease Pharmacist ?06/23/2021 4:16 PM ? ? ?

## 2021-06-23 NOTE — Plan of Care (Signed)

## 2021-06-23 NOTE — Progress Notes (Signed)
? ? ?HD#3 ?Subjective:  ?Overnight Events: NAEO ? ? Patient says he briefly talked with ortho yesterday and he knows the plan is for surgery Thursday. Says his pain is a 2/10. Otherwise he is doing ok. ? ?Objective:  ?Vital signs in last 24 hours: ?Vitals:  ? 06/22/21 0748 06/22/21 1946 06/23/21 0340 06/23/21 0748  ?BP: 134/77 126/88 135/76 137/76  ?Pulse: 65 66 84 61  ?Resp: '17 17 18 18  '$ ?Temp: (!) 97.5 ?F (36.4 ?C) 97.6 ?F (36.4 ?C) (!) 97.5 ?F (36.4 ?C) 97.9 ?F (36.6 ?C)  ?TempSrc: Oral   Oral  ?SpO2: 97% 97% 97% 97%  ? ?Supplemental O2: Room Air ?SpO2: 97 % ? ? ?Physical Exam:  ?Constitutional: elderly man resting in bed, in no acute distress ?HEENT: normocephalic atraumatic, mucous membranes moist, conjunctiva non-erythematous ?Cardiovascular: regular rate and rhythm, no m/r/g ?Pulmonary/Chest: normal work of breathing on room air, lungs clear to auscultation bilaterally ?Abdominal: soft, non-tender, non-distended ?MSK: normal bulk and tone, RLE in bandage ?Neurological: alert & oriented x 3, answering questions appropriately ?Skin: warm and dry ?Psych: normal affect ? ?There were no vitals filed for this visit. ? ? ?Intake/Output Summary (Last 24 hours) at 06/23/2021 1437 ?Last data filed at 06/23/2021 1053 ?Gross per 24 hour  ?Intake 600 ml  ?Output 2050 ml  ?Net -1450 ml  ? ?Net IO Since Admission: -2,625 mL [06/23/21 1437] ? ?Pertinent Labs: ?CBC Latest Ref Rng & Units 06/21/2021 06/20/2021 04/19/2021  ?WBC 4.0 - 10.5 K/uL 8.9 5.5 8.1  ?Hemoglobin 13.0 - 17.0 g/dL 13.8 14.7 14.2  ?Hematocrit 39.0 - 52.0 % 41.8 46.6 42.5  ?Platelets 150 - 400 K/uL 154 160 223  ? ? ?CMP Latest Ref Rng & Units 06/23/2021 06/21/2021 06/20/2021  ?Glucose 70 - 99 mg/dL 87 153(H) 137(H)  ?BUN 8 - 23 mg/dL 18 10 6(L)  ?Creatinine 0.61 - 1.24 mg/dL 0.88 0.80 0.94  ?Sodium 135 - 145 mmol/L 137 139 140  ?Potassium 3.5 - 5.1 mmol/L 4.0 3.4(L) 3.4(L)  ?Chloride 98 - 111 mmol/L 105 104 103  ?CO2 22 - 32 mmol/L '24 27 27  '$ ?Calcium 8.9 - 10.3  mg/dL 8.5(L) 8.6(L) 9.1  ?Total Protein 6.5 - 8.1 g/dL - - 6.7  ?Total Bilirubin 0.3 - 1.2 mg/dL - - 0.8  ?Alkaline Phos 38 - 126 U/L - - 116  ?AST 15 - 41 U/L - - 22  ?ALT 0 - 44 U/L - - 19  ? ? ?Imaging: ?No results found. ? ?Assessment/Plan:  ? ?Principal Problem: ?  Trimalleolar fracture of ankle, closed, right, initial encounter ? ? ?Patient Summary: ?Brent Griffin. is a 76 y.o. 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 ?  ?Trimalleolar fracture of R ankle ?Ortho planning for procedure on Thursday. Will continue to have patient work with PT until then and will likely need SNF after that.  ?-NPO '@[place]'$  Wed ?-will need PT following operative repair to determine placement (patient okay with short term rehab if needed) ?-holding DAPT and lovenox ppx in anticipation for surgery ?  ?Suspected osteoporosis ?Patient with a ground level fall and fracture indicating osteoporosis. Will need to follow up with PCP as an outpatient. This is made higher risk by patient's use of chronic steroids for PMR ?- outpatient follow up ?  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 He was asymptomatic. EKG showing 2nd degree SA block Mobitz type 1 with 4:3 and 3:2 cycles. Discussed with cardiology, who advised to reduce home metoprolol dose to '50mg'$  qhs. ?-greatly appreciate Dr. Victorino December assistance ?-reduced home toprol-xl to '50mg'$  qhs ?-d/c telemetry ?  ?CAD s/p cardiac cath ?HTN ?Cardiac cath with stent placement in 01/2021. Done as elective procedure. Resumed home norvasc, losartan, and spironolactone. Decreased toprol-xl dose to '50mg'$  qhs (see above). Holding DAPT in anticipation for upcoming surgery (see above). ?  ?PMR ?Chronic R knee  pain ?-continue home prednisone '10mg'$  daily ?  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 ?-continue home memantine '5mg'$  daily ?  ?Hypokalemia, chronic ?States this is a chronic issue for him. Takes spironolactone and potassium supplementation at home for this. ?-continue spironolactone ?-continue home klor-con 5mq BID ?  ?Prior to Admission Living Arrangement: Home ?Anticipated Discharge Location: TBD ?Barriers to Discharge: need for operative repair ?Dispo: Anticipated discharge in approximately 2-3 day(s).  ?  ? ?AScarlett Presto MD ?Internal Medicine Resident PGY-1 ?Pager [phone]?Please contact the on call pager after 5 pm and on weekends at [phone] ? ?

## 2021-06-23 NOTE — TOC Initial Note (Signed)
Transition of Care (TOC) - Initial/Assessment Note  ? ? ?Patient Details  ?Name: Brent Griffin. ?MRN: [id number] ?Date of Birth: Oct 06, 1945 ? ?[facility]) CM/SW Contact:    ?Joanne Chars, LCSW ?Phone Number: ?06/23/2021, 3:05 PM ? ?Clinical Narrative:    CSW met with pt for initial assessment.  Pt scheduled for surgery Thursday, prior PT eval recommends SNF, pt aware and willing to go to SNF if still recommended after procedure.  Pt lives with wife, Brent Griffin, permission given to speak with her and with daughter Brent Griffin.  Pt has been vaccinated for covid with one booster.   ?TOC will continue to follow for DC needs.              ? ? ?Expected Discharge Plan:  (TBD) ?Barriers to Discharge: Continued Medical Work up, Other (must enter comment) (pending surgery thursday) ? ? ?Patient Goals and CMS Choice ?Patient states their goals for this hospitalization and ongoing recovery are:: get back to where I was ?  ?  ? ?Expected Discharge Plan and Services ?Expected Discharge Plan:  (TBD) ?In-house Referral: Clinical Social Work ?  ?Post Acute Care Choice:  (TBD) ?Living arrangements for the past 2 months: [place] ?                ?  ?  ?  ?  ?  ?  ?  ?  ?  ?  ? ?Prior Living Arrangements/Services ?Living arrangements for the past 2 months: [place] ?Lives with:: Spouse ?Patient language and need for interpreter reviewed:: Yes ?Do you feel safe going back to the place where you live?: Yes      ?Need for Family Participation in Patient Care: No (Comment) ?Care giver support system in place?: Yes (comment) ?Current home services: Other (comment) (none) ?Criminal Activity/Legal Involvement Pertinent to Current Situation/Hospitalization: No - Comment as needed ? ?Activities of Daily Living ?Home Assistive Devices/Equipment: None ?ADL Screening (condition at time of admission) ?Patient's cognitive ability adequate to safely complete daily activities?: Yes ?Is the patient deaf or have  difficulty hearing?: No ?Does the patient have difficulty seeing, even when wearing glasses/contacts?: No ?Does the patient have difficulty concentrating, remembering, or making decisions?: No ?Patient able to express need for assistance with ADLs?: Yes ?Does the patient have difficulty dressing or bathing?: No ?Independently performs ADLs?: Yes (appropriate for developmental age) ?Does the patient have difficulty walking or climbing stairs?: No ?Weakness of Legs: None ?Weakness of Arms/Hands: None ? ?Permission Sought/Granted ?Permission sought to share information with : Family Supports ?Permission granted to share information with : Yes, Verbal Permission Granted ? Share Information with NAME: wife Brent Griffin, daughter Brent Griffin ?   ?   ?   ? ?Emotional Assessment ?Appearance:: Appears stated age ?Attitude/Demeanor/Rapport: Engaged ?Affect (typically observed): Appropriate, Pleasant ?Orientation: : Oriented to Self, Oriented to Place, Oriented to  Time, Oriented to Situation ?Alcohol / Substance Use: Not Applicable ?Psych Involvement: No (comment) ? ?Admission diagnosis:  Closed fracture of right ankle, initial encounter [S82.891A] ?Trimalleolar fracture of ankle, closed, right, initial encounter [S82.851A] ?Patient Active Problem List  ? Diagnosis Date Noted  ? Trimalleolar fracture of ankle, closed, right, initial encounter 06/20/2021  ? Diarrhea 06/06/2020  ? Change in bowel habits 06/06/2020  ? Surgery, elective   ? Acute metabolic encephalopathy 49/20/1007  ? Closed fracture of left distal humerus 04/01/2020  ? Hypokalemia 03/13/2018  ? Adjustment insomnia 03/10/2018  ? Renal insufficiency 03/10/2018  ? Tobacco use disorder  03/10/2018  ? Neck pain 12/01/2017  ? 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 Urinary hesitancy 09/08/2017  ? Prostate cancer ([place]) 01/16/2016  ? Hyperlipidemia LDL goal <100 08/27/2014  ? Hyperlipidemia associated with type 2 diabetes mellitus ([place]) 08/27/2014  ? Muscle  spasm of back 02/13/2014  ? Rosacea 02/13/2014  ? Scoliosis 02/13/2014  ? Essential hypertension 02/12/2014  ? Elevated prostate specific antigen (PSA) 01/06/2014  ? Muscle spasms of neck 12/17/2013  ? Type II diabetes mellitus ([place]) 05/13/2013  ? ?PCP:  [name], PA-C ?Pharmacy:   ?[place], [address] ?[address] ?[place] [address] ?Phone: [phone] Fax: [phone] ? ?[facility] [place], [address] ?[place] ?[place] [address] ?Phone: [phone] Fax: [phone] ? ? ? ? ?Social Determinants of Health (SDOH) Interventions ?  ? ?Readmission Risk Interventions ?No flowsheet data found. ? ? ?

## 2021-06-23 NOTE — Care Management Important Message (Signed)
Important Message ? ?Patient Details  ?Name: Brent Griffin. ?MRN: [id number] ?Date of Birth: November 10, 1945 ? ? ?Medicare Important Message Given:  Yes ? ? ? ? ?Levada Dy  Kareemah Grounds-Martin ?06/23/2021, 12:19 PM ?

## 2021-06-24 DIAGNOSIS — S82851A Displaced trimalleolar fracture of right lower leg, initial encounter for closed fracture: Secondary | ICD-10-CM | POA: Diagnosis not present

## 2021-06-24 MED ORDER — ENOXAPARIN SODIUM 40 MG/0.4ML IJ SOSY
40.0000 mg | PREFILLED_SYRINGE | INTRAMUSCULAR | Status: AC
  Administered 2021-06-24: 40 mg via SUBCUTANEOUS
  Filled 2021-06-24: qty 0.4

## 2021-06-24 MED ORDER — CEFAZOLIN SODIUM-DEXTROSE 2-4 GM/100ML-% IV SOLN
2.0000 g | INTRAVENOUS | Status: AC
  Administered 2021-06-25: 2 g via INTRAVENOUS
  Filled 2021-06-24: qty 100

## 2021-06-24 NOTE — Progress Notes (Signed)
Physical Therapy Treatment ?Patient Details ?Name: Brent Griffin. ?MRN: [id number] ?DOB: 23-Nov-1945 ?Today's Date: 06/24/2021 ? ? ?History of Present Illness Pt is a 76 y.o. male admitted 06/19/21 with ongoing R ankle pain after fall 5 wks ago; pt had been walking on foot until seen by outpatient ortho which revealed trimalleolar fx, plan for non-operative management with NWB; pt now requesting operative management. Per ortho, plan for open tx of R trimalleolar fx with possible syndesmosis fixation on 3/23. PMH includes arthritis, cancer, DM2, HLD, HTN. ?  ?PT Comments  ? ? Pt progressing with mobility, preparing for R ankle sx scheduled tomorrow. Today's session focused on transfer training and therex; pt motivated to participate, still having difficulty ambulating due to muscle fatigue resulting in decreased ability to maintain RLE NWB precautions. Pt remains limited by generalized weakness, decreased activity tolerance, and impaired balance strategies/postural reactions. Continue to recommend SNF-level therapies to maximize functional mobility and independence prior to return home. ?   ?Recommendations for follow up therapy are one component of a multi-disciplinary discharge planning process, led by the attending physician.  Recommendations may be updated based on patient status, additional functional criteria and insurance authorization. ? ?Follow Up Recommendations ? Skilled nursing-short term rehab (<3 hours/day) ?  ?  ?Assistance Recommended at Discharge Frequent or constant Supervision/Assistance  ?Patient can return home with the following A lot of help with walking and/or transfers;A little help with bathing/dressing/bathroom;Assist for transportation;Help with stairs or ramp for entrance ?  ?Equipment Recommendations ? BSC/3in1;Wheelchair (measurements PT);Wheelchair cushion (measurements PT)  ?  ?Recommendations for Other Services   ? ? ?  ?Precautions / Restrictions Precautions ?Precautions:  Fall ?Required Braces or Orthoses: Splint/Cast ?Splint/Cast: R ankle ?Restrictions ?Weight Bearing Restrictions: Yes ?RLE Weight Bearing: Non weight bearing  ?  ? ?Mobility ? Bed Mobility ?Overal bed mobility: Modified Independent ?  ?  ?  ?  ?  ?  ?General bed mobility comments: supine<>sit with bed flat ?  ? ?Transfers ?Overall transfer level: Needs assistance ?Equipment used: Rolling walker (2 wheels) ?Transfers: Sit to/from Stand, Bed to chair/wheelchair/BSC ?Sit to Stand: Min assist ?  ?  ?  ?  ?  ?General transfer comment: reliant on momentum to power into standing from recliner to RW, minA to assist RLE to maintain NWB precautions; pt requiring frequent cues for this; repeated sit<>stands from recliner; step pivot from recliner>bed with RW and min guard, pt using RLE despite cues ?  ? ?Ambulation/Gait ?  ?  ?  ?  ?  ?  ?  ?  ? ? ?Stairs ?  ?  ?  ?  ?  ? ? ?Wheelchair Mobility ?  ? ?Modified Rankin (Stroke Patients Only) ?  ? ? ?  ?Balance Overall balance assessment: Needs assistance, History of Falls ?Sitting-balance support: No upper extremity supported, Feet supported ?Sitting balance-Leahy Scale: Good ?  ?  ?Standing balance support: Reliant on assistive device for balance ?Standing balance-Leahy Scale: Poor ?Standing balance comment: dependent on BUE support ?  ?  ?  ?  ?  ?  ?  ?  ?  ?  ?  ?  ? ?  ?Cognition Arousal/Alertness: Awake/alert ?Behavior During Therapy: [facility] for tasks assessed/performed ?Overall Cognitive Status: Impaired/Different from baseline ?Area of Impairment: Attention, Safety/judgement, Memory, Problem solving ?  ?  ?  ?  ?  ?  ?  ?  ?  ?Current Attention Level: Selective ?Memory: Decreased recall of precautions ?  ?Safety/Judgement: Decreased awareness of  safety, Decreased awareness of deficits ?  ?Problem Solving: Requires verbal cues ?General Comments: pt aware of NWB precautions, but continues to require frequent cues during mobility to maintain. pt with good recall of therex/HEP ?   ?  ? ?  ?Exercises General Exercises - Lower Extremity ?Long Arc Quad: AROM, Both, 20 reps, Seated ?Hip ABduction/ADduction: AROM, Right, 5 reps, Sidelying (pt having difficulty achieving proper form despite cues) ?Straight Leg Raises: AROM, Both, 10 reps, Supine ?Hip Flexion/Marching: AROM, Both, 20 reps, Seated ?Other Exercises ?Other Exercises: 8x repeated sit<>stand from recliner, reliant on momentum and UE support to push to stand ? ?  ?General Comments   ?  ?  ? ?Pertinent Vitals/Pain Pain Assessment ?Pain Assessment: No/denies pain  ? ? ?Home Living   ?  ?  ?  ?  ?  ?  ?  ?  ?  ?   ?  ?Prior Function    ?  ?  ?   ? ?PT Goals (current goals can now be found in the care plan section) Progress towards PT goals: Progressing toward goals ? ?  ?Frequency ? ? ? Min 3X/week ? ? ? ?  ?PT Plan Current plan remains appropriate  ? ? ?Co-evaluation   ?  ?  ?  ?  ? ?  ?AM-PAC PT "6 Clicks" Mobility   ?Outcome Measure ? Help needed turning from your back to your side while in a flat bed without using bedrails?: None ?Help needed moving from lying on your back to sitting on the side of a flat bed without using bedrails?: None ?Help needed moving to and from a bed to a chair (including a wheelchair)?: A Little ?Help needed standing up from a chair using your arms (e.g., wheelchair or bedside chair)?: A Little ?Help needed to walk in hospital room?: Total ?Help needed climbing 3-5 steps with a railing? : Total ?6 Click Score: 16 ? ?  ?End of Session Equipment Utilized During Treatment: Gait belt ?Activity Tolerance: Patient tolerated treatment well ?Patient left: in bed;with call bell/phone within reach;with bed alarm set ?Nurse Communication: Mobility status ?PT Visit Diagnosis: Unsteadiness on feet (R26.81);Other abnormalities of gait and mobility (R26.89);Repeated falls (R29.6);Muscle weakness (generalized) (M62.81) ?  ? ? ?Time: 1093-2355 ?PT Time Calculation (min) (ACUTE ONLY): 19 min ? ?Charges:  $Therapeutic Exercise:  8-22 mins          ?          ? ?Mabeline Caras, PT, DPT ?Acute Rehabilitation Services  ?Pager [phone] ?Office [phone] ? ?Derry Lory ?06/24/2021, 1:53 PM ? ?

## 2021-06-24 NOTE — Progress Notes (Signed)
? ? ?HD#4 ?Subjective:  ?Overnight Events: NAEO ? ? Patient looking forward to surgery. Finds it annoying to have to wait to eat tomorrow until after 12. Food is just ok here. Has worked to try and get out of bed when possible. ? ?Objective:  ?Vital signs in last 24 hours: ?Vitals:  ? 06/23/21 1500 06/24/21 0407 06/24/21 0444 06/24/21 0735  ?BP: 128/81 115/70  117/68  ?Pulse: 81 84  (!) 58  ?Resp: '17 16  17  '$ ?Temp: 98.8 ?F (37.1 ?C) (!) 97.5 ?F (36.4 ?C) 98.2 ?F (36.8 ?C) 97.6 ?F (36.4 ?C)  ?TempSrc: Oral  Oral Oral  ?SpO2: 98% 97%  96%  ?Weight: 79.3 kg     ?Height: '5\' 10"'$  (1.778 m)     ? ?Supplemental O2: Room Air ?SpO2: 96 % ? ? ?Physical Exam:  ?Constitutional: Elderly man resting comfortably in chair with breakfast in front of him ?HEENT: normocephalic atraumatic, mucous membranes moist , conjunctiva non-erythematous ?Cardiovascular: regular rate and rhythm, no m/r/g ?Pulmonary/Chest: normal work of breathing on room air, lungs clear to auscultation bilaterally  ?Abdominal: soft, non-tender, non-distended ?MSK: normal bulk and tone, moves all extremities ?Neurological: alert & oriented x 3, answering questions appropriately ?Skin: warm and dry, RLE wrapped in bandage ?Psych: normal affect ? ?Filed Weights  ? 06/23/21 1500  ?Weight: 79.3 kg  ? ? ? ?Intake/Output Summary (Last 24 hours) at 06/24/2021 1354 ?Last data filed at 06/24/2021 1324 ?Gross per 24 hour  ?Intake 480 ml  ?Output 1950 ml  ?Net -1470 ml  ? ?Net IO Since Admission: -4,095 mL [06/24/21 1354] ? ?Pertinent Labs: ? ?  Latest Ref Rng & Units 06/21/2021  ?  1:58 AM 06/20/2021  ?  1:43 PM 04/19/2021  ? 11:28 AM  ?CBC  ?WBC 4.0 - 10.5 K/uL 8.9   5.5   8.1    ?Hemoglobin 13.0 - 17.0 g/dL 13.8   14.7   14.2    ?Hematocrit 39.0 - 52.0 % 41.8   46.6   42.5    ?Platelets 150 - 400 K/uL 154   160   223    ? ? ? ?  Latest Ref Rng & Units 06/23/2021  ?  1:35 AM 06/21/2021  ?  1:58 AM 06/20/2021  ?  1:43 PM  ?CMP  ?Glucose 70 - 99 mg/dL 87   153   137    ?BUN 8 - 23  mg/dL '18   10   6    '$ ?Creatinine 0.61 - 1.24 mg/dL 0.88   0.80   0.94    ?Sodium 135 - 145 mmol/L 137   139   140    ?Potassium 3.5 - 5.1 mmol/L 4.0   3.4   3.4    ?Chloride 98 - 111 mmol/L 105   104   103    ?CO2 22 - 32 mmol/L '24   27   27    '$ ?Calcium 8.9 - 10.3 mg/dL 8.5   8.6   9.1    ?Total Protein 6.5 - 8.1 g/dL   6.7    ?Total Bilirubin 0.3 - 1.2 mg/dL   0.8    ?Alkaline Phos 38 - 126 U/L   116    ?AST 15 - 41 U/L   22    ?ALT 0 - 44 U/L   19    ? ? ?Imaging: ?No results found. ? ?Assessment/Plan:  ? ?Principal Problem: ?  Trimalleolar fracture of ankle, closed, right, initial encounter ? ? ?Patient  Summary: ?Brent Griffin. is a 76 y.o. 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 ?  ?Trimalleolar fracture of R ankle ?Ortho planning for procedure tomorrpw. Will continue to have patient work with PT after surgery but will likely remain recommended for SNF ?-NPO '@[place]'$   ?-will need PT following operative repair to determine placement (patient okay with short term rehab if needed) ?-holding DAPT and lovenox ppx in anticipation for surgery ?  ?Suspected osteoporosis ?Patient with a ground level fall and fracture indicating osteoporosis. Will need to follow up with PCP as an outpatient. This is made higher risk by patient's use of chronic steroids for PMR ?- outpatient follow up ?  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 He was asymptomatic. EKG showing 2nd degree SA block Mobitz type 1 with 4:3 and 3:2 cycles. Discussed with cardiology, who advised to reduce home metoprolol dose to '50mg'$  qhs. ?-greatly appreciate Dr. Victorino December assistance ?-reduced home toprol-xl to '50mg'$  qhs ?-d/c telemetry ?  ?CAD s/p cardiac  cath ?HTN ?Cardiac cath with stent placement in 01/2021. Done as elective procedure. Resumed home norvasc, losartan, and spironolactone. Decreased toprol-xl dose to '50mg'$  qhs (see above). Holding DAPT in anticipation for upcoming surgery (see above). ?  ?PMR ?Chronic R knee pain ?-continue home prednisone '10mg'$  daily ?  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 ?-continue home memantine '5mg'$  daily ?  ?Hypokalemia, chronic ?States this is a chronic issue for him. Takes spironolactone and potassium supplementation at home for this. ?-continue spironolactone ?-continue home klor-con 25mq BID ?  ?Prior to Admission Living Arrangement: Home ?Anticipated Discharge Location: TBD ?Barriers to Discharge: need for operative repair ?Dispo: Anticipated discharge in approximately 2-3 day(s).  ? ? ?AScarlett Presto MD ?Internal Medicine Resident PGY-1 ?Pager [phone]?Please contact the on call pager after 5 pm and on weekends at [phone] ? ?

## 2021-06-24 NOTE — Plan of Care (Signed)

## 2021-06-25 ENCOUNTER — Encounter (HOSPITAL_COMMUNITY): Payer: Self-pay | Admitting: Internal Medicine

## 2021-06-25 ENCOUNTER — Inpatient Hospital Stay (HOSPITAL_COMMUNITY): Payer: Medicare Other | Admitting: Certified Registered Nurse Anesthetist

## 2021-06-25 ENCOUNTER — Other Ambulatory Visit: Payer: Self-pay

## 2021-06-25 ENCOUNTER — Encounter (HOSPITAL_COMMUNITY)
Admission: EM | Disposition: A | Discharge status: Home / Self Care | Payer: Self-pay | Source: Home / Self Care | Attending: Internal Medicine

## 2021-06-25 ENCOUNTER — Inpatient Hospital Stay (HOSPITAL_COMMUNITY): Payer: Medicare Other

## 2021-06-25 DIAGNOSIS — S82851A Displaced trimalleolar fracture of right lower leg, initial encounter for closed fracture: Secondary | ICD-10-CM

## 2021-06-25 DIAGNOSIS — S93431A Sprain of tibiofibular ligament of right ankle, initial encounter: Secondary | ICD-10-CM

## 2021-06-25 HISTORY — PX: ORIF ANKLE FRACTURE: SHX5408

## 2021-06-25 HISTORY — PX: SYNDESMOSIS REPAIR: SHX5182

## 2021-06-25 LAB — CBC
HCT: 40 % (ref 39.0–52.0)
Hemoglobin: 13.2 g/dL (ref 13.0–17.0)
MCH: 29.9 pg (ref 26.0–34.0)
MCHC: 33 g/dL (ref 30.0–36.0)
MCV: 90.7 fL (ref 80.0–100.0)
Platelets: 178 10*3/uL (ref 150–400)
RBC: 4.41 MIL/uL (ref 4.22–5.81)
RDW: 17.2 % — ABNORMAL HIGH (ref 11.5–15.5)
WBC: 5.5 10*3/uL (ref 4.0–10.5)
nRBC: 0 % (ref 0.0–0.2)

## 2021-06-25 LAB — BASIC METABOLIC PANEL
Anion gap: 5 (ref 5–15)
BUN: 19 mg/dL (ref 8–23)
CO2: 23 mmol/L (ref 22–32)
Calcium: 8.6 mg/dL — ABNORMAL LOW (ref 8.9–10.3)
Chloride: 109 mmol/L (ref 98–111)
Creatinine, Ser: 0.82 mg/dL (ref 0.61–1.24)
GFR, Estimated: >60 mL/min (ref >60)
Glucose, Bld: 102 mg/dL — ABNORMAL HIGH (ref 70–99)
Potassium: 4 mmol/L (ref 3.5–5.1)
Sodium: 137 mmol/L (ref 135–145)

## 2021-06-25 LAB — GLUCOSE, CAPILLARY
Glucose-Capillary: 98 mg/dL (ref 70–99)
Glucose-Capillary: 97 mg/dL (ref 70–99)

## 2021-06-25 SURGERY — OPEN REDUCTION INTERNAL FIXATION (ORIF) ANKLE FRACTURE
Anesthesia: General | Site: Ankle | Laterality: Right

## 2021-06-25 MED ORDER — CHLORHEXIDINE GLUCONATE 0.12 % MT SOLN
OROMUCOSAL | Status: AC
  Administered 2021-06-25: 15 mL via OROMUCOSAL
  Filled 2021-06-25: qty 15

## 2021-06-25 MED ORDER — MORPHINE SULFATE (PF) 2 MG/ML IV SOLN: 0.5000 mg | INTRAVENOUS | Status: DC | PRN

## 2021-06-25 MED ORDER — ORAL CARE MOUTH RINSE: 15.0000 mL | Freq: Once | OROMUCOSAL | Status: AC

## 2021-06-25 MED ORDER — ONDANSETRON HCL 4 MG/2ML IJ SOLN: 4.0000 mg | Freq: Four times a day (QID) | INTRAMUSCULAR | Status: DC | PRN

## 2021-06-25 MED ORDER — ACETAMINOPHEN 500 MG PO TABS: 1000.0000 mg | ORAL_TABLET | Freq: Once | ORAL | Status: AC

## 2021-06-25 MED ORDER — HYDROCODONE-ACETAMINOPHEN 5-325 MG PO TABS
1.0000 | ORAL_TABLET | ORAL | Status: DC | PRN
  Administered 2021-06-27 – 2021-06-29 (×5): 1 via ORAL
  Filled 2021-06-25 (×5): qty 1

## 2021-06-25 MED ORDER — PROPOFOL 10 MG/ML IV BOLUS
INTRAVENOUS | Status: AC
  Filled 2021-06-25: qty 20

## 2021-06-25 MED ORDER — ACETAMINOPHEN 500 MG PO TABS
ORAL_TABLET | ORAL | Status: AC
  Administered 2021-06-25: 1000 mg via ORAL
  Filled 2021-06-25: qty 2

## 2021-06-25 MED ORDER — METOCLOPRAMIDE HCL 5 MG/ML IJ SOLN: 5.0000 mg | Freq: Three times a day (TID) | INTRAMUSCULAR | Status: DC | PRN

## 2021-06-25 MED ORDER — HYDROCODONE-ACETAMINOPHEN 7.5-325 MG PO TABS
1.0000 | ORAL_TABLET | ORAL | Status: DC | PRN
  Filled 2021-06-25: qty 1

## 2021-06-25 MED ORDER — LACTATED RINGERS IV SOLN: INTRAVENOUS | Status: DC

## 2021-06-25 MED ORDER — FENTANYL CITRATE (PF) 250 MCG/5ML IJ SOLN
INTRAMUSCULAR | Status: AC
  Filled 2021-06-25: qty 5

## 2021-06-25 MED ORDER — CELECOXIB 200 MG PO CAPS: 200.0000 mg | ORAL_CAPSULE | Freq: Once | ORAL | Status: AC

## 2021-06-25 MED ORDER — DOCUSATE SODIUM 100 MG PO CAPS
100.0000 mg | ORAL_CAPSULE | Freq: Two times a day (BID) | ORAL | Status: DC
  Administered 2021-06-25 – 2021-06-29 (×7): 100 mg via ORAL
  Filled 2021-06-25 (×8): qty 1

## 2021-06-25 MED ORDER — ACETAMINOPHEN 500 MG PO TABS
500.0000 mg | ORAL_TABLET | Freq: Four times a day (QID) | ORAL | Status: AC
  Administered 2021-06-25 – 2021-06-26 (×4): 500 mg via ORAL
  Filled 2021-06-25 (×4): qty 1

## 2021-06-25 MED ORDER — 0.9 % SODIUM CHLORIDE (POUR BTL) OPTIME
TOPICAL | Status: DC | PRN
  Administered 2021-06-25: 1000 mL

## 2021-06-25 MED ORDER — DEXAMETHASONE SODIUM PHOSPHATE 10 MG/ML IJ SOLN
INTRAMUSCULAR | Status: AC
  Filled 2021-06-25: qty 1

## 2021-06-25 MED ORDER — MIDAZOLAM HCL 2 MG/2ML IJ SOLN: 1.0000 mg | Freq: Once | INTRAMUSCULAR | Status: AC

## 2021-06-25 MED ORDER — CEFAZOLIN SODIUM-DEXTROSE 1-4 GM/50ML-% IV SOLN
1.0000 g | Freq: Four times a day (QID) | INTRAVENOUS | Status: AC
  Administered 2021-06-25 – 2021-06-26 (×3): 1 g via INTRAVENOUS
  Filled 2021-06-25 (×3): qty 50

## 2021-06-25 MED ORDER — ONDANSETRON HCL 4 MG/2ML IJ SOLN
INTRAMUSCULAR | Status: AC
  Filled 2021-06-25: qty 2

## 2021-06-25 MED ORDER — PHENYLEPHRINE 40 MCG/ML (10ML) SYRINGE FOR IV PUSH (FOR BLOOD PRESSURE SUPPORT)
PREFILLED_SYRINGE | INTRAVENOUS | Status: DC | PRN
  Administered 2021-06-25 (×4): 80 ug via INTRAVENOUS

## 2021-06-25 MED ORDER — LIDOCAINE 2% (20 MG/ML) 5 ML SYRINGE
INTRAMUSCULAR | Status: AC
  Filled 2021-06-25: qty 5

## 2021-06-25 MED ORDER — FENTANYL CITRATE (PF) 100 MCG/2ML IJ SOLN: 50.0000 ug | Freq: Once | INTRAMUSCULAR | Status: AC

## 2021-06-25 MED ORDER — EPHEDRINE SULFATE-NACL 50-0.9 MG/10ML-% IV SOSY
PREFILLED_SYRINGE | INTRAVENOUS | Status: DC | PRN
Start: 2021-06-25 — End: 2021-06-25
  Administered 2021-06-25 (×4): 5 mg via INTRAVENOUS

## 2021-06-25 MED ORDER — CHLORHEXIDINE GLUCONATE 0.12 % MT SOLN: 15.0000 mL | Freq: Once | OROMUCOSAL | Status: AC

## 2021-06-25 MED ORDER — INSULIN ASPART 100 UNIT/ML IJ SOLN: 0.0000 [IU] | INTRAMUSCULAR | Status: DC | PRN

## 2021-06-25 MED ORDER — ONDANSETRON HCL 4 MG PO TABS: 4.0000 mg | ORAL_TABLET | Freq: Four times a day (QID) | ORAL | Status: DC | PRN

## 2021-06-25 MED ORDER — DEXAMETHASONE SODIUM PHOSPHATE 10 MG/ML IJ SOLN
INTRAMUSCULAR | Status: DC | PRN
  Administered 2021-06-25: 6 mg via INTRAVENOUS

## 2021-06-25 MED ORDER — METOCLOPRAMIDE HCL 5 MG PO TABS: 5.0000 mg | ORAL_TABLET | Freq: Three times a day (TID) | ORAL | Status: DC | PRN

## 2021-06-25 MED ORDER — MIDAZOLAM HCL 2 MG/2ML IJ SOLN
INTRAMUSCULAR | Status: AC
  Administered 2021-06-25: 1 mg via INTRAVENOUS
  Filled 2021-06-25: qty 2

## 2021-06-25 MED ORDER — CELECOXIB 200 MG PO CAPS
ORAL_CAPSULE | ORAL | Status: AC
  Administered 2021-06-25: 200 mg via ORAL
  Filled 2021-06-25: qty 1

## 2021-06-25 MED ORDER — FENTANYL CITRATE (PF) 100 MCG/2ML IJ SOLN
INTRAMUSCULAR | Status: DC | PRN
  Administered 2021-06-25: 25 ug via INTRAVENOUS

## 2021-06-25 MED ORDER — PHENYLEPHRINE HCL-NACL 20-0.9 MG/250ML-% IV SOLN
INTRAVENOUS | Status: DC | PRN
  Administered 2021-06-25: 100 ug/min via INTRAVENOUS

## 2021-06-25 MED ORDER — ROPIVACAINE HCL 5 MG/ML IJ SOLN
INTRAMUSCULAR | Status: DC | PRN
  Administered 2021-06-25: 25 mL via PERINEURAL
  Administered 2021-06-25: 20 mL via PERINEURAL

## 2021-06-25 MED ORDER — PROPOFOL 10 MG/ML IV BOLUS
INTRAVENOUS | Status: DC | PRN
  Administered 2021-06-25: 130 mg via INTRAVENOUS

## 2021-06-25 MED ORDER — FENTANYL CITRATE (PF) 100 MCG/2ML IJ SOLN
INTRAMUSCULAR | Status: AC
  Administered 2021-06-25: 50 ug via INTRAVENOUS
  Filled 2021-06-25: qty 2

## 2021-06-25 MED ORDER — LIDOCAINE 2% (20 MG/ML) 5 ML SYRINGE
INTRAMUSCULAR | Status: DC | PRN
  Administered 2021-06-25: 20 mg via INTRAVENOUS

## 2021-06-25 MED ORDER — ONDANSETRON HCL 4 MG/2ML IJ SOLN
INTRAMUSCULAR | Status: DC | PRN
  Administered 2021-06-25: 4 mg via INTRAVENOUS

## 2021-06-25 MED ORDER — CLONIDINE HCL (ANALGESIA) 100 MCG/ML EP SOLN
EPIDURAL | Status: DC | PRN
  Administered 2021-06-25 (×2): 50 ug

## 2021-06-25 MED ORDER — PHENYLEPHRINE 40 MCG/ML (10ML) SYRINGE FOR IV PUSH (FOR BLOOD PRESSURE SUPPORT)
PREFILLED_SYRINGE | INTRAVENOUS | Status: AC
  Filled 2021-06-25: qty 10

## 2021-06-25 SURGICAL SUPPLY — 63 items
ALCOHOL 70% 16 OZ (MISCELLANEOUS) ×2 IMPLANT
BAG COUNTER SPONGE SURGICOUNT (BAG) ×2 IMPLANT
BANDAGE ESMARK 6X9 LF (GAUZE/BANDAGES/DRESSINGS) IMPLANT
BIT DRILL 2 CANN GRADUATED (BIT) ×1 IMPLANT
BIT DRILL 2.6 CANN (BIT) ×1 IMPLANT
BIT DRILL ANKLE 2 (DRILL) ×1 IMPLANT
BLADE SURG 15 STRL LF DISP TIS (BLADE) ×1 IMPLANT
BLADE SURG 15 STRL SS (BLADE) ×2
BNDG COHESIVE 4X5 TAN STRL (GAUZE/BANDAGES/DRESSINGS) IMPLANT
BNDG COHESIVE 6X5 TAN STRL LF (GAUZE/BANDAGES/DRESSINGS) IMPLANT
BNDG ELASTIC 6X10 VLCR STRL LF (GAUZE/BANDAGES/DRESSINGS) ×2 IMPLANT
BNDG ESMARK 6X9 LF (GAUZE/BANDAGES/DRESSINGS)
CANISTER SUCT 3000ML PPV (MISCELLANEOUS) ×2 IMPLANT
CHLORAPREP W/TINT 26 (MISCELLANEOUS) ×4 IMPLANT
COVER SURGICAL LIGHT HANDLE (MISCELLANEOUS) ×2 IMPLANT
CUFF TOURN SGL QUICK 34 (TOURNIQUET CUFF) ×2
CUFF TOURN SGL QUICK 42 (TOURNIQUET CUFF) IMPLANT
CUFF TRNQT CYL 34X4.125X (TOURNIQUET CUFF) ×1 IMPLANT
DRAPE OEC MINIVIEW 54X84 (DRAPES) ×2 IMPLANT
DRAPE U-SHAPE 47X51 STRL (DRAPES) ×2 IMPLANT
DRSG MEPITEL 4X7.2 (GAUZE/BANDAGES/DRESSINGS) ×2 IMPLANT
DRSG PAD ABDOMINAL 8X10 ST (GAUZE/BANDAGES/DRESSINGS) ×4 IMPLANT
DRSG XEROFORM 1X8 (GAUZE/BANDAGES/DRESSINGS) ×2 IMPLANT
ELECT REM PT RETURN 9FT ADLT (ELECTROSURGICAL) ×2
ELECTRODE REM PT RTRN 9FT ADLT (ELECTROSURGICAL) ×1 IMPLANT
GAUZE SPONGE 4X4 12PLY STRL (GAUZE/BANDAGES/DRESSINGS) IMPLANT
GAUZE SPONGE 4X4 12PLY STRL LF (GAUZE/BANDAGES/DRESSINGS) ×2 IMPLANT
GLOVE SRG 8 PF TXTR STRL LF DI (GLOVE) ×2 IMPLANT
GLOVE SURG ENC TEXT LTX SZ7.5 (GLOVE) ×4 IMPLANT
GLOVE SURG UNDER POLY LF SZ8 (GLOVE) ×4
GOWN STRL REUS W/ TWL LRG LVL3 (GOWN DISPOSABLE) ×1 IMPLANT
GOWN STRL REUS W/ TWL XL LVL3 (GOWN DISPOSABLE) ×2 IMPLANT
GOWN STRL REUS W/TWL LRG LVL3 (GOWN DISPOSABLE) ×2
GOWN STRL REUS W/TWL XL LVL3 (GOWN DISPOSABLE) ×4
GUIDEWIRE 1.35MM (WIRE) ×1 IMPLANT
KIT BASIN OR (CUSTOM PROCEDURE TRAY) ×2 IMPLANT
KIT TURNOVER KIT B (KITS) ×2 IMPLANT
NS IRRIG 1000ML POUR BTL (IV SOLUTION) ×2 IMPLANT
PACK ORTHO EXTREMITY (CUSTOM PROCEDURE TRAY) ×2 IMPLANT
PAD ARMBOARD 7.5X6 YLW CONV (MISCELLANEOUS) ×4 IMPLANT
PAD CAST 4YDX4 CTTN HI CHSV (CAST SUPPLIES) ×1 IMPLANT
PADDING CAST COTTON 4X4 STRL (CAST SUPPLIES) ×2
PLATE TITANIUM 6H RT FIB ANKLE (Plate) ×1 IMPLANT
SCREW CANN QF ST 4.0X48 (Screw) ×2 IMPLANT
SCREW CORT 3.5X18 THRD (Screw) ×1 IMPLANT
SCREW CORT FT ANKLE 3.5X54 (Screw) ×1 IMPLANT
SCREW CORT FT LP 3.5X58 (Screw) ×1 IMPLANT
SCREW LOCK 18X3XVALOPRFL (Screw) IMPLANT
SCREW LOCKING 3.0X18 (Screw) ×8 IMPLANT
SCREW LOCKING VARIABLE 3.0X16 (Screw) ×1 IMPLANT
SCREW LP CORT 3.0X28MM (Screw) ×1 IMPLANT
SCREW LP TI 3.5X14MM (Screw) ×2 IMPLANT
SCREW LP TITANIUM 3.5X24MM (Screw) ×1 IMPLANT
SPONGE T-LAP 18X18 ~~LOC~~+RFID (SPONGE) ×2 IMPLANT
SUCTION FRAZIER HANDLE 10FR (MISCELLANEOUS) ×2
SUCTION TUBE FRAZIER 10FR DISP (MISCELLANEOUS) ×1 IMPLANT
SUT ETHILON 3 0 PS 1 (SUTURE) ×2 IMPLANT
SUT MNCRL AB 3-0 PS2 27 (SUTURE) ×2 IMPLANT
SUT VIC AB 2-0 CT1 27 (SUTURE) ×4
SUT VIC AB 2-0 CT1 TAPERPNT 27 (SUTURE) ×2 IMPLANT
TOWEL GREEN STERILE (TOWEL DISPOSABLE) ×2 IMPLANT
TOWEL GREEN STERILE FF (TOWEL DISPOSABLE) ×2 IMPLANT
TUBE CONNECTING 12X1/4 (SUCTIONS) ×2 IMPLANT

## 2021-06-25 NOTE — Anesthesia Procedure Notes (Signed)
Anesthesia Regional Block: Adductor canal block  ? ?Pre-Anesthetic Checklist: , timeout performed,  Correct Patient, Correct Site, Correct Laterality,  Correct Procedure, Correct Position, site marked,  Risks and benefits discussed,  Surgical consent,  Pre-op evaluation,  At surgeon's request and post-op pain management ? ?Laterality: Right ? ?Prep: chloraprep     ?  ?Needles:  ?Injection technique: Single-shot ? ?Needle Type: Echogenic Needle   ? ? ?Needle Length: 9cm  ?Needle Gauge: 21  ? ? ? ?Additional Needles: ? ? ?Procedures:,,,, ultrasound used (permanent image in chart),,    ?Narrative:  ?Start time: 06/25/2021 12:39 PM ?End time: 06/25/2021 12:44 PM ?Injection made incrementally with aspirations every 5 mL. ? ?Performed by: Personally  ?Anesthesiologist: Suzette Battiest, MD ? ? ? ? ?

## 2021-06-25 NOTE — Plan of Care (Signed)
?  Problem: Clinical Measurements: ?Goal: Ability to maintain clinical measurements within normal limits will improve ?Outcome: Progressing ?Goal: Will remain free from infection ?Outcome: Progressing ?Goal: Diagnostic test results will improve ?Outcome: Progressing ?Goal: Respiratory complications will improve ?Outcome: Progressing ?Goal: Cardiovascular complication will be avoided ?Outcome: Progressing ?  ?Problem: Activity: ?Goal: Risk for activity intolerance will decrease ?Outcome: Progressing ?  ?Problem: Elimination: ?Goal: Will not experience complications related to bowel motility ?Outcome: Progressing ?Goal: Will not experience complications related to urinary retention ?Outcome: Progressing ?  ?Problem: Pain Managment: ?Goal: General experience of comfort will improve ?Outcome: Progressing ?  ?Problem: Safety: ?Goal: Ability to remain free from injury will improve ?Outcome: Progressing ?  ?Problem: Skin Integrity: ?Goal: Risk for impaired skin integrity will decrease ?Outcome: Progressing ?  ?

## 2021-06-25 NOTE — Anesthesia Postprocedure Evaluation (Signed)
Anesthesia Post Note ? ?Patient: Brent Griffin. ? ?Procedure(s) Performed: OPEN TREATMENT OF RIGHT SUBACUTE TRIMALLEOLAR ANKLE FRACTURE (Right: Ankle) ?SYNDESMOSIS (Right: Ankle) ? ?  ? ?Patient location during evaluation: PACU ?Anesthesia Type: General ?Level of consciousness: awake and alert ?Pain management: pain level controlled ?Vital Signs Assessment: post-procedure vital signs reviewed and stable ?Respiratory status: spontaneous breathing, nonlabored ventilation, respiratory function stable and patient connected to nasal cannula oxygen ?Cardiovascular status: blood pressure returned to baseline and stable ?Postop Assessment: no apparent nausea or vomiting ?Anesthetic complications: no ? ? ?No notable events documented. ? ?Last Vitals:  ?Vitals:  ? 06/25/21 1445 06/25/21 1449  ?BP:  109/72  ?Pulse: 76 69  ?Resp: 16 15  ?Temp:    ?SpO2: 92% 91%  ?  ?Last Pain:  ?Vitals:  ? 06/25/21 1434  ?TempSrc:   ?PainSc: Asleep  ? ? ?  ?  ?  ?  ?  ?  ? ?Suzette Battiest E ? ? ? ? ?

## 2021-06-25 NOTE — Transfer of Care (Signed)
Immediate Anesthesia Transfer of Care Note ? ?Patient: Brent Griffin. ? ?Procedure(s) Performed: OPEN TREATMENT OF RIGHT SUBACUTE TRIMALLEOLAR ANKLE FRACTURE (Right: Ankle) ?SYNDESMOSIS (Right: Ankle) ? ?Patient Location: PACU ? ?Anesthesia Type:General and Regional ? ?Level of Consciousness: awake ? ?Airway & Oxygen Therapy: Patient Spontanous Breathing and Patient connected to face mask oxygen ? ?Post-op Assessment: Report given to RN and Post -op Vital signs reviewed and stable ? ?Post vital signs: Reviewed and stable ? ?Last Vitals:  ?Vitals Value Taken Time  ?BP 111/76 06/25/21 1434  ?Temp    ?Pulse 66 06/25/21 1435  ?Resp 15 06/25/21 1435  ?SpO2 98 % 06/25/21 1435  ?Vitals shown include unvalidated device data. ? ?Last Pain:  ?Vitals:  ? 06/25/21 1255  ?TempSrc:   ?PainSc: 0-No pain  ?   ? ?Patients Stated Pain Goal: 0 (06/23/21 2110) ? ?Complications: No notable events documented. ?

## 2021-06-25 NOTE — Progress Notes (Signed)
? ? ?HD#5 ?Subjective:  ?Overnight Events: NAEO ? ? Feeling bored. Ready for surgery today. Hopes to be able to go soon as he wants to get it over with. No other complaints. ? ?Objective:  ?Vital signs in last 24 hours: ?Vitals:  ? 06/24/21 2124 06/25/21 0525 06/25/21 0818 06/25/21 0858  ?BP: 126/88 130/77 129/79 136/78  ?Pulse: 78 (!) 44 (!) 59 60  ?Resp: '17 19 18 18  '$ ?Temp: 98.4 ?F (36.9 ?C) 97.9 ?F (36.6 ?C) 98.6 ?F (37 ?C) 98.7 ?F (37.1 ?C)  ?TempSrc: Oral Oral Oral Oral  ?SpO2: 98% 99% 99% 95%  ?Weight:      ?Height:      ? ?Supplemental O2: Room Air ?SpO2: 95 % ? ? ?Physical Exam:  ?Constitutional: Elderly man resting comfortably in bed, in no acute distress ?HEENT: normocephalic atraumatic, mucous membranes moist, conjunctiva non-erythematous ?Cardiovascular: regular rate and rhythm, no m/r/g ?Pulmonary/Chest: normal work of breathing on room air, lungs clear to auscultation bilaterally  ?Abdominal: soft, non-tender, non-distended ?MSK: normal bulk and tone, RLE wrapped in bandage, able to move all extremities ?Neurological: alert & oriented x 3, answering questions appropriately ?Skin: warm and dry ?Psych: normal affect ? ?Filed Weights  ? 06/23/21 1500  ?Weight: 79.3 kg  ? ? ? ?Intake/Output Summary (Last 24 hours) at 06/25/2021 1146 ?Last data filed at 06/24/2021 2130 ?Gross per 24 hour  ?Intake --  ?Output 1150 ml  ?Net -1150 ml  ? ?Net IO Since Admission: -4,995 mL [06/25/21 1146] ? ?Pertinent Labs: ? ?  Latest Ref Rng & Units 06/25/2021  ?  3:49 AM 06/21/2021  ?  1:58 AM 06/20/2021  ?  1:43 PM  ?CBC  ?WBC 4.0 - 10.5 K/uL 5.5   8.9   5.5    ?Hemoglobin 13.0 - 17.0 g/dL 13.2   13.8   14.7    ?Hematocrit 39.0 - 52.0 % 40.0   41.8   46.6    ?Platelets 150 - 400 K/uL 178   154   160    ? ? ? ?  Latest Ref Rng & Units 06/25/2021  ?  3:49 AM 06/23/2021  ?  1:35 AM 06/21/2021  ?  1:58 AM  ?CMP  ?Glucose 70 - 99 mg/dL 102   87   153    ?BUN 8 - 23 mg/dL '19   18   10    '$ ?Creatinine 0.61 - 1.24 mg/dL 0.82   0.88   0.80     ?Sodium 135 - 145 mmol/L 137   137   139    ?Potassium 3.5 - 5.1 mmol/L 4.0   4.0   3.4    ?Chloride 98 - 111 mmol/L 109   105   104    ?CO2 22 - 32 mmol/L '23   24   27    '$ ?Calcium 8.9 - 10.3 mg/dL 8.6   8.5   8.6    ? ? ?Imaging: ?No results found. ? ?Assessment/Plan:  ? ?Principal Problem: ?  Trimalleolar fracture of ankle, closed, right, initial encounter ? ? ?Patient Summary: ?Brent Griffin. is a 76 y.o. 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 ?  ?Trimalleolar fracture of R ankle ?Ortho planning for procedure today. Will continue to have patient work with PT after surgery but will  likely remain recommended for SNF ?-NPO until surgery today ?-will need PT following operative repair to determine placement (patient okay with short term rehab if needed) ?-holding DAPT and lovenox ppx in anticipation for surgery ?  ?Suspected osteoporosis ?Patient with a ground level fall and fracture indicating osteoporosis. Will need to follow up with PCP as an outpatient. This is made higher risk by patient's use of chronic steroids for PMR ?- outpatient follow up ?  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 He was asymptomatic. EKG showing 2nd degree SA block Mobitz type 1 with 4:3 and 3:2 cycles. Discussed with cardiology, who advised to reduce home metoprolol dose to '50mg'$  qhs. ?-greatly appreciate Dr. Victorino December assistance ?-reduced home toprol-xl to '50mg'$  qhs ?-d/c telemetry ?  ?CAD s/p cardiac cath ?HTN ?Cardiac cath with stent placement in 01/2021. Done as elective procedure. Resumed home norvasc, losartan, and spironolactone. Decreased toprol-xl dose to '50mg'$  qhs (see above). Holding DAPT in anticipation for upcoming surgery  (see above). ?  ?PMR ?Chronic R knee pain ?-continue home prednisone '10mg'$  daily ?  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 ?-continue home memantine '5mg'$  daily ?  ?Hypokalemia, chronic ?States this is a chronic issue for him. Takes spironolactone and potassium supplementation at home for this. ?-continue spironolactone ?-continue home klor-con 75mq BID ?  ?Prior to Admission Living Arrangement: Home ?Anticipated Discharge Location: TBD ?Barriers to Discharge: need for operative repair ?Dispo: Anticipated discharge in approximately 2-3 day(s).  ?  ? ?AScarlett Presto MD ?Internal Medicine Resident PGY-1 ?Pager [phone]?Please contact the on call pager after 5 pm and on weekends at [phone] ? ?

## 2021-06-25 NOTE — Progress Notes (Signed)
? ? ? ?  Brent Griffin. is a 76 y.o. male  ? ? ?H&P update: I have reviewed the H&P dated within the last 30 days and there are no changes.  Patient has a subacute displaced trimalleolar ankle fracture on the right side.  Plan is for open treatment of his trimalleolar fracture with likely syndesmotic fixation. ? ?Orthopaedic diagnosis: Subacute right trimalleolar ankle fracture with likely syndesmotic disruption ? ?Subjective: ?Patient seen in the preoperative holding area.  He is comfortable in a splint.  He is anticipating surgery.  Denies pain on his foot. ? ?Objectyive: ?Vitals:  ? 06/25/21 0818 06/25/21 0858  ?BP: 129/79 136/78  ?Pulse: (!) 59 60  ?Resp: 18 18  ?Temp: 98.6 ?F (37 ?C) 98.7 ?F (37.1 ?C)  ?SpO2: 99% 95%  ?  ? ?Exam: ?Awake and alert ?Respirations even and unlabored ?No acute distress ? ?Splint intact.  Splint was folded back.  He is dry skin about the foot.  No ecchymosis about the dorsal foot.  No tenderness to palpation about the dorsal foot.  Tenderness to palpation some about the lateral and medial ankle.  Altered sensibility commensurate with his neuropathy.  Foot is warm and well-perfused. ? ?Assessment: ?Right subacute trimalleolar ankle fracture and likely syndesmotic disruption ?CT evidence of some midfoot fractures that are nondisplaced ? ? ?Plan: ?We will plan for open treatment of his right subacute trimalleolar ankle fracture.  He will likely need syndesmotic fixation.  We discussed the risks, benefits and alternatives to surgery and he would like to proceed.  He does understand that the subacute nature of this fracture does limit our ability to obtain anatomic reduction and this was discussed.  We also discussed the possibility of posttraumatic arthritis.  We discussed that he does have some evidence of nondisplaced fracturing of the midfoot joints but he does not have any tenderness or evidence of injury in this area.  This may be artifact versus changes due to arthritis and  osteophytosis.  This was also discussed with the patient.  He would like to proceed with surgery. ? ?Postoperatively patient will be suitable for discharge from the hospital once cleared by physical therapy.  He is requesting skilled nursing facility placement.  Patient will be touchdown weightbearing. ? ? ?Radene Journey, MD ?  ?

## 2021-06-25 NOTE — Anesthesia Preprocedure Evaluation (Addendum)
Anesthesia Evaluation  ?Patient identified by MRN, date of birth, ID band ?Patient awake ? ? ? ?Reviewed: ?Allergy & Precautions, NPO status , Patient's Chart, lab work & pertinent test results ? ?Airway ?Mallampati: II ? ?TM Distance: >3 FB ?Neck ROM: Full ? ? ? Dental ? ?(+) Dental Advisory Given ?  ?Pulmonary ?Current Smoker and Patient abstained from smoking.,  ?  ?breath sounds clear to auscultation ? ? ? ? ? ? Cardiovascular ?hypertension, Pt. on medications and Pt. on home beta blockers ? ?Rhythm:Regular Rate:Normal ? ? ?  ?Neuro/Psych ?negative neurological ROS ?   ? GI/Hepatic ?negative GI ROS, Neg liver ROS,   ?Endo/Other  ?diabetes, Type 2 ? Renal/GU ?negative Renal ROS  ? ?  ?Musculoskeletal ? ?(+) Arthritis ,  ? Abdominal ?  ?Peds ? Hematology ?negative hematology ROS ?(+)   ?Anesthesia Other Findings ? ? Reproductive/Obstetrics ? ?  ? ? ? ? ? ? ? ? ? ? ? ? ? ?  ?  ? ? ? ? ? ? ? ? ?Anesthesia Physical ?Anesthesia Plan ? ?ASA: 2 ? ?Anesthesia Plan: General  ? ?Post-op Pain Management: Regional block*, Tylenol PO (pre-op)* and Celebrex PO (pre-op)*  ? ?Induction: Intravenous ? ?PONV Risk Score and Plan: 1 and Ondansetron, Dexamethasone and Treatment may vary due to age or medical condition ? ?Airway Management Planned: LMA ? ?Additional Equipment: None ? ?Intra-op Plan:  ? ?Post-operative Plan: Extubation in OR ? ?Informed Consent: I have reviewed the patients History and Physical, chart, labs and discussed the procedure including the risks, benefits and alternatives for the proposed anesthesia with the patient or authorized representative who has indicated his/her understanding and acceptance.  ? ? ? ?Dental advisory given ? ?Plan Discussed with: CRNA ? ?Anesthesia Plan Comments:   ? ? ? ? ? ? ?Anesthesia Quick Evaluation ? ?

## 2021-06-25 NOTE — Anesthesia Procedure Notes (Signed)
Anesthesia Regional Block: Popliteal block  ? ?Pre-Anesthetic Checklist: , timeout performed,  Correct Patient, Correct Site, Correct Laterality,  Correct Procedure, Correct Position, site marked,  Risks and benefits discussed,  Surgical consent,  Pre-op evaluation,  At surgeon's request and post-op pain management ? ?Laterality: Right ? ?Prep: chloraprep     ?  ?Needles:  ?Injection technique: Single-shot ? ?Needle Type: Echogenic Needle   ? ? ?Needle Length: 9cm  ?Needle Gauge: 21  ? ? ? ?Additional Needles: ? ? ?Procedures:,,,, ultrasound used (permanent image in chart),,    ?Narrative:  ?Start time: 06/25/2021 12:33 PM ?End time: 06/25/2021 12:39 PM ?Injection made incrementally with aspirations every 5 mL. ? ?Performed by: Personally  ?Anesthesiologist: Suzette Battiest, MD ? ? ? ? ?

## 2021-06-25 NOTE — Anesthesia Procedure Notes (Signed)
Procedure Name: LMA Insertion ?Date/Time: 06/25/2021 1:05 PM ?Performed by: Lieutenant Diego, CRNA ?Pre-anesthesia Checklist: Patient identified, Emergency Drugs available, Suction available and Patient being monitored ?Patient Re-evaluated:Patient Re-evaluated prior to induction ?Oxygen Delivery Method: Circle system utilized ?Preoxygenation: Pre-oxygenation with 100% oxygen ?Induction Type: IV induction ?Ventilation: Mask ventilation without difficulty ?LMA: LMA inserted ?LMA Size: 5.0 ?Number of attempts: 1 ?Placement Confirmation: positive ETCO2 and breath sounds checked- equal and bilateral ?Tube secured with: Tape ?Dental Injury: Teeth and Oropharynx as per pre-operative assessment  ? ? ? ? ?

## 2021-06-25 NOTE — Progress Notes (Signed)
Patient arrived back from unit around 1600. VSS. Room air. Tolerating diet. Drsg intact. Voiding in urinal. Not OOB yet. Skin intact. Wife at bedside. No complaints at this time. Call bell within reach. Bed alarm on.  ?

## 2021-06-25 NOTE — Progress Notes (Signed)
Report given to Pre-op RN.

## 2021-06-25 NOTE — Brief Op Note (Signed)
06/25/2021 ? ?2:29 PM ? ?PATIENT:  Brent Griffin.  76 y.o. male ? ?PRE-OPERATIVE DIAGNOSIS:  RIGHT TRIMALLEOLAR ANKLE FRACTURE, POSSIBLE SYNDESMOSIS DISRUPTION ? ?POST-OPERATIVE DIAGNOSIS:  RIGHT TRIMALLEOLAR ANKLE FRACTURE, SYNDESMOSIS DISRUPTION ? ?PROCEDURE:  Procedure(s) with comments: ?OPEN TREATMENT OF RIGHT SUBACUTE TRIMALLEOLAR ANKLE FRACTURE (Right) - LENGHT OF SURGERY: 90 MINUTES ?SYNDESMOSIS (Right) ? ?SURGEON:  Surgeon(s) and Role: ?   Erle Crocker, MD - Primary ? ?PHYSICIAN ASSISTANT: Laurice Kimmons [place], PA-C ? ?ANESTHESIA:   general, right adductor canal block  ? ?EBL:  Minimal  ? ?BLOOD ADMINISTERED:none ? ?DRAINS: none  ? ?LOCAL MEDICATIONS USED:  NONE ? ?SPECIMEN:  No Specimen ? ?DISPOSITION OF SPECIMEN:  N/A ? ?COUNTS:  YES ? ?TOURNIQUET:   ?Total Tourniquet Time Documented: ?Thigh (Right) - 60 minutes ?Total: Thigh (Right) - 60 minutes ? ?DICTATION: .Dragon Dictation ? ?PLAN OF CARE: Admit for overnight observation ? ?PATIENT DISPOSITION:  PACU - hemodynamically stable. ?  ?Delay start of Pharmacological VTE agent (>24hrs) due to surgical blood loss or risk of bleeding: no ? ?

## 2021-06-26 DIAGNOSIS — S82851A Displaced trimalleolar fracture of right lower leg, initial encounter for closed fracture: Secondary | ICD-10-CM | POA: Diagnosis not present

## 2021-06-26 LAB — CBC
HCT: 39.8 % (ref 39.0–52.0)
Hemoglobin: 13.1 g/dL (ref 13.0–17.0)
MCH: 29.4 pg (ref 26.0–34.0)
MCHC: 32.9 g/dL (ref 30.0–36.0)
MCV: 89.4 fL (ref 80.0–100.0)
Platelets: 191 10*3/uL (ref 150–400)
RBC: 4.45 MIL/uL (ref 4.22–5.81)
RDW: 16.9 % — ABNORMAL HIGH (ref 11.5–15.5)
WBC: 7.8 10*3/uL (ref 4.0–10.5)
nRBC: 0 % (ref 0.0–0.2)

## 2021-06-26 LAB — BASIC METABOLIC PANEL
Anion gap: 7 (ref 5–15)
BUN: 18 mg/dL (ref 8–23)
CO2: 24 mmol/L (ref 22–32)
Calcium: 9.1 mg/dL (ref 8.9–10.3)
Chloride: 107 mmol/L (ref 98–111)
Creatinine, Ser: 0.9 mg/dL (ref 0.61–1.24)
GFR, Estimated: >60 mL/min (ref >60)
Glucose, Bld: 118 mg/dL — ABNORMAL HIGH (ref 70–99)
Potassium: 4.5 mmol/L (ref 3.5–5.1)
Sodium: 138 mmol/L (ref 135–145)

## 2021-06-26 MED ORDER — CLOPIDOGREL BISULFATE 75 MG PO TABS: 75.0000 mg | ORAL_TABLET | Freq: Every day | ORAL | Status: DC

## 2021-06-26 MED ORDER — CLOPIDOGREL BISULFATE 75 MG PO TABS
75.0000 mg | ORAL_TABLET | Freq: Every day | ORAL | Status: DC
  Administered 2021-06-26 – 2021-06-29 (×4): 75 mg via ORAL
  Filled 2021-06-26 (×4): qty 1

## 2021-06-26 MED ORDER — ASPIRIN EC 81 MG PO TBEC
81.0000 mg | DELAYED_RELEASE_TABLET | Freq: Every day | ORAL | Status: DC
  Administered 2021-06-26 – 2021-06-29 (×4): 81 mg via ORAL
  Filled 2021-06-26 (×4): qty 1

## 2021-06-26 MED ORDER — ASPIRIN EC 81 MG PO TBEC
81.0000 mg | DELAYED_RELEASE_TABLET | Freq: Every day | ORAL | Status: DC
Start: 2021-06-27 — End: 2021-06-26

## 2021-06-26 NOTE — Progress Notes (Signed)
? ? ?HD#6 ?Subjective:  ?Overnight Events: NAEO ? ?States surgery went well. Can feel slight sensation but was told it will come back gradually. Knows he can use the walker and do toe touch. Would like to do rehabilitation for a couple of weeks so that he can work on strength buildup. Believes it would be more helpful for him and his wife before he gets back home. States he has no pain at this time. ? ?Objective:  ?Vital signs in last 24 hours: ?Vitals:  ? 06/26/21 0239 06/26/21 0440 06/26/21 0800 06/26/21 0837  ?BP:  117/74 (!) 143/73 121/71  ?Pulse: 80 84 83 68  ?Resp: '18 18 16 16  '$ ?Temp: 97.8 ?F (36.6 ?C) 97.8 ?F (36.6 ?C) 98.3 ?F (36.8 ?C) 98.1 ?F (36.7 ?C)  ?TempSrc: Oral Oral Oral Oral  ?SpO2: 97% 97% 98% 97%  ?Weight:      ?Height:      ? ?Supplemental O2: Room Air ?SpO2: 97 % ?O2 Flow Rate (L/min): 7 L/min ? ? ?Physical Exam:  ?Constitutional: Elderly man resting comfortably in bed, in no acute distress ?HEENT: normocephalic atraumatic, mucous membranes moist, conjunctiva non-erythematous ?Cardiovascular: regular rate and rhythm, no m/r/g ?Pulmonary/Chest: normal work of breathing on room air ?Abdominal: soft, non-tender, non-distended ?MSK: normal bulk and tone, RLE wrapped in bandage, able to move all extremities ?Neurological: alert & oriented x 3, answering questions appropriately ?Skin: warm and dry ?Psych: normal affect ? ?Filed Weights  ? 06/23/21 1500  ?Weight: 79.3 kg  ? ? ? ?Intake/Output Summary (Last 24 hours) at 06/26/2021 1027 ?Last data filed at 06/26/2021 0456 ?Gross per 24 hour  ?Intake 970 ml  ?Output 2000 ml  ?Net -1030 ml  ? ?Net IO Since Admission: -6,025 mL [06/26/21 1027] ? ?Pertinent Labs: ? ?  Latest Ref Rng & Units 06/26/2021  ?  3:41 AM 06/25/2021  ?  3:49 AM 06/21/2021  ?  1:58 AM  ?CBC  ?WBC 4.0 - 10.5 K/uL 7.8   5.5   8.9    ?Hemoglobin 13.0 - 17.0 g/dL 13.1   13.2   13.8    ?Hematocrit 39.0 - 52.0 % 39.8   40.0   41.8    ?Platelets 150 - 400 K/uL 191   178   154    ? ? ? ?  Latest  Ref Rng & Units 06/26/2021  ?  3:41 AM 06/25/2021  ?  3:49 AM 06/23/2021  ?  1:35 AM  ?CMP  ?Glucose 70 - 99 mg/dL 118   102   87    ?BUN 8 - 23 mg/dL '18   19   18    '$ ?Creatinine 0.61 - 1.24 mg/dL 0.90   0.82   0.88    ?Sodium 135 - 145 mmol/L 138   137   137    ?Potassium 3.5 - 5.1 mmol/L 4.5   4.0   4.0    ?Chloride 98 - 111 mmol/L 107   109   105    ?CO2 22 - 32 mmol/L '24   23   24    '$ ?Calcium 8.9 - 10.3 mg/dL 9.1   8.6   8.5    ? ? ?Imaging: ?DG Ankle Complete Right ? ?Result Date: 06/25/2021 ?CLINICAL DATA:  ORIF of the right ankle. EXAM: RIGHT ANKLE - COMPLETE 3+ VIEW COMPARISON:  CT right ankle 06/19/2021 FINDINGS: Images were performed intraoperatively without the presence of a radiologist. The patient appears to be undergoing distal fibular lateral plate and screw  fixation, distal tibiofibular transverse screw fixation, and 2 screw fixation of the medial malleolus. Ankle mortise appears intact. Total fluoroscopy images: 4 Total fluoroscopy time: 31 seconds Total dose: 1.2793 mGy Please see intraoperative findings for further detail. IMPRESSION: Fluoroscopy for ankle ORIF. Electronically Signed   By: Yvonne Kendall M.D.   On: 06/25/2021 15:07  ? ?DG C-Arm 1-60 Min-No Report ? ?Result Date: 06/25/2021 ?Fluoroscopy was utilized by the requesting physician.  No radiographic interpretation.   ? ?Assessment/Plan:  ? ?Principal Problem: ?  Trimalleolar fracture of ankle, closed, right, initial encounter ? ? ?Patient Summary: ?Brent Forgette. is a 76 y.o.  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 ?  ?Trimalleolar fracture of R ankle s/p syndesmotic fixation on 3/24 ?Ortho repair done yesterday. Will work to mobilize patient today and see PT/OT recs and whether SNF vs CIR may be a good option. OK from ortho  standpoint to restart DAPT. ?-f/u updated PT OT recs ?- restart DAPT 3/25 ?- toe touch weight bearing ?- 2 week ortho follow up ?  ?Suspected osteoporosis ?Patient with a ground level fall and fracture indicating osteoporosis. Will need to follow up with PCP as an outpatient. This is made higher risk by patient's use of chronic steroids for PMR ?- outpatient follow up ?  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 He was asymptomatic. EKG showing 2nd degree SA block Mobitz type 1 with 4:3 and 3:2 cycles. Discussed with cardiology, who advised to reduce home metoprolol dose to '50mg'$  qhs. ?-greatly appreciate Dr. Victorino December assistance ?-reduced home toprol-xl to '50mg'$  qhs ?  ?CAD s/p cardiac cath ?HTN ?Cardiac cath with stent placement in 01/2021. Done as elective procedure. Resumed home norvasc, losartan, and spironolactone. Decreased toprol-xl dose to '50mg'$  qhs (see above).  ?- restart dapt 3/25 ?  ?PMR ?Chronic R knee pain ?-continue home prednisone '10mg'$  daily ?  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 ?-continue home memantine '5mg'$  daily ?  ?Hypokalemia, chronic ?States this is a chronic issue for him. Takes spironolactone and potassium supplementation at home for this. ?-continue spironolactone ?-continue home klor-con 84mq BID ?  ?Prior to Admission Living Arrangement: Home ?Anticipated Discharge Location: TBD ?Barriers to Discharge: need for operative repair ?Dispo: Anticipated discharge in approximately 2-3 day(s).  ?  ? ?AScarlett Presto MD ?Internal Medicine Resident PGY-1 ?Pager [phone]?Please contact the on call pager after 5 pm and on weekends at [phone] ? ?

## 2021-06-26 NOTE — TOC Progression Note (Addendum)
Transition of Care (TOC) - Progression Note  ? ? ?Patient Details  ?Name: Brent Griffin. ?MRN: [id number] ?Date of Birth: 1945/06/22 ? ?Transition of Care (TOC) CM/SW Contact  ?Joanne Chars, LCSW ?Phone Number: ?06/26/2021, 1:54 PM ? ?Clinical Narrative:   CSW met with pt and wife, discussed SNF options, choice document provided.  They are requesting [facility] as first choice, also interested in [facility].  [facility] out with [facility]  Will send out referral to [facility] once the printer is working.  ? ?8648: referral faxed to [facility]. ? ?1525: CSW received phone calls from [facility] and from [place]: both full with no anticipated DC coming up.   ? ?1540: CSW provided bed offers to pt wife Wells Guiles, she would like to choose [facility].  CSW confirmed with [facility] that they can accept pt over the weekend.  Rolla Plate will confirm the medicare, please contact her for weekend admission.   ? ?Expected Discharge Plan:  (TBD) ?Barriers to Discharge: Continued Medical Work up, Other (must enter comment) (pending surgery thursday) ? ?Expected Discharge Plan and Services ?Expected Discharge Plan:  (TBD) ?In-house Referral: Clinical Social Work ?  ?Post Acute Care Choice:  (TBD) ?Living arrangements for the past 2 months: [place] ?                ?  ?  ?  ?  ?  ?  ?  ?  ?  ?  ? ? ?Social Determinants of Health (SDOH) Interventions ?  ? ?Readmission Risk Interventions ?   ? View : No data to display.  ?  ?  ?  ? ? ?

## 2021-06-26 NOTE — Progress Notes (Signed)
Physical Therapy Treatment- Reevaluation ?Patient Details ?Name: Brent Griffin. ?MRN: [id number] ?DOB: 09-24-1945 ?Today's Date: 06/26/2021 ? ? ?History of Present Illness Pt is a 76 y.o. male admitted 06/19/21 with ongoing R ankle pain after fall 5 wks ago; pt had been walking on foot until seen by outpatient ortho which revealed trimalleolar fx, plan for non-operative management with NWB; pt now requesting operative management. Per ortho, plan for open tx of R trimalleolar fx with possible syndesmosis fixation on 3/23. PMH includes arthritis, cancer, DM2, HLD, HTN. Patient now s/p fixation of R ankle 06/25/21, TDWB. ? ?  ?PT Comments  ? ? Patient received in bed, had surgery yesterday, fixation of R ankle. He is now TDWB on the right. Patient is mod independent with bed mobility. He requires min assist for sit to stand. Frequent cues to maintain TDWB on R LE during mobility. Patient seems unable to maintain without frequent cues. He needs min guard to min assist for ambulation in room with RW. Patient reports independence with HEP that was provided.  Patient will continue to benefit from skilled PT while here to improve safety with mobility, activity tolerance and education regarding adherence to weight bearing limitations during mobility.  ?   ?Recommendations for follow up therapy are one component of a multi-disciplinary discharge planning process, led by the attending physician.  Recommendations may be updated based on patient status, additional functional criteria and insurance authorization. ? ?Follow Up Recommendations ? Acute inpatient rehab (3hours/day) ?  ?  ?Assistance Recommended at Discharge Frequent or constant Supervision/Assistance  ?Patient can return home with the following A lot of help with walking and/or transfers;A little help with bathing/dressing/bathroom;Assist for transportation;Help with stairs or ramp for entrance ?  ?Equipment Recommendations ? Other (comment) (TBD)  ?   ?Recommendations for Other Services Rehab consult ? ? ?  ?Precautions / Restrictions Precautions ?Precautions: Fall ?Required Braces or Orthoses: Splint/Cast ?Splint/Cast: R ankle ?Restrictions ?Weight Bearing Restrictions: Yes ?RLE Weight Bearing: Touchdown weight bearing  ?  ? ?Mobility ? Bed Mobility ?Overal bed mobility: Modified Independent ?  ?  ?  ?  ?  ?  ?General bed mobility comments: supine<>sit with bed flat ?  ? ?Transfers ?Overall transfer level: Needs assistance ?Equipment used: Rolling walker (2 wheels) ?Transfers: Sit to/from Stand ?Sit to Stand: Min guard ?  ?  ?  ?  ?  ?  ?  ? ?Ambulation/Gait ?Ambulation/Gait assistance: Min guard, Min assist ?Gait Distance (Feet): 50 Feet ?Assistive device: Rolling walker (2 wheels) ?Gait Pattern/deviations: Step-to pattern ?Gait velocity: Decreased ?  ?  ?General Gait Details: Patient able to ambulate ~50 feet in room with RW, frequent cues needed for TDWB as it appears he is ambulating with full WB on R LE. Seated rests after 20 or so feet. ? ? ?Stairs ?  ?  ?  ?  ?  ? ? ?Wheelchair Mobility ?  ? ?Modified Rankin (Stroke Patients Only) ?  ? ? ?  ?Balance Overall balance assessment: Needs assistance, History of Falls ?Sitting-balance support: Feet supported ?Sitting balance-Leahy Scale: Normal ?  ?  ?Standing balance support: Bilateral upper extremity supported, During functional activity, Reliant on assistive device for balance ?Standing balance-Leahy Scale: Fair ?Standing balance comment: dependent on BUE support to maintain TDWB on R LE ?  ?  ?  ?  ?  ?  ?  ?  ?  ?  ?  ?  ? ?  ?Cognition Arousal/Alertness: Awake/alert ?Behavior During Therapy: [facility] for  tasks assessed/performed ?Overall Cognitive Status: Impaired/Different from baseline ?Area of Impairment: Attention, Memory, Problem solving, Safety/judgement, Awareness ?  ?  ?  ?  ?  ?  ?  ?  ?  ?Current Attention Level: Selective ?Memory: Decreased recall of precautions ?  ?Safety/Judgement: Decreased  awareness of safety, Decreased awareness of deficits ?Awareness: Emergent ?Problem Solving: Requires verbal cues ?General Comments: pt aware of TDWB precautions, but continues to require frequent cues during mobility to maintain. pt with good recall of therex/HEP. ?  ?  ? ?  ?Exercises Other Exercises ?Other Exercises: [employer] HEP handout provided (Access Code [email or web address]) and educ on -- clarified that majority of pictures demo RLE WB on bed/ground, but clarified verbally and written instructions that pt to keep R foot TDWB; pt reports understanding. Patient reports he is independent with these and has been performing them, but then has questions about them... ? ?  ?General Comments   ?  ?  ? ?Pertinent Vitals/Pain Pain Assessment ?Pain Assessment: No/denies pain  ? ? ?Home Living   ?  ?  ?  ?  ?  ?  ?  ?  ?  ?   ?  ?Prior Function    ?  ?  ?   ? ?PT Goals (current goals can now be found in the care plan section) Acute Rehab PT Goals ?Patient Stated Goal: to go to inpatient rehab if he can get in. ?PT Goal Formulation: With patient ?Time For Goal Achievement: 07/04/21 ?Potential to Achieve Goals: Fair ?Progress towards PT goals: Progressing toward goals ? ?  ?Frequency ? ? ? Min 3X/week ? ? ? ?  ?PT Plan Current plan remains appropriate  ? ? ?Co-evaluation   ?  ?  ?  ?  ? ?  ?AM-PAC PT "6 Clicks" Mobility   ?Outcome Measure ? Help needed turning from your back to your side while in a flat bed without using bedrails?: None ?Help needed moving from lying on your back to sitting on the side of a flat bed without using bedrails?: None ?Help needed moving to and from a bed to a chair (including a wheelchair)?: A Little ?Help needed standing up from a chair using your arms (e.g., wheelchair or bedside chair)?: A Little ?Help needed to walk in hospital room?: A Little (but needs assist to maintain TDWB and limit activity) ?Help needed climbing 3-5 steps with a railing? : Total ?6 Click Score: 18 ? ?  ?End of Session  Equipment Utilized During Treatment: Gait belt ?Activity Tolerance: Patient tolerated treatment well ?Patient left: in chair;with call bell/phone within reach ?Nurse Communication: Mobility status ?PT Visit Diagnosis: Unsteadiness on feet (R26.81);Other abnormalities of gait and mobility (R26.89);Repeated falls (R29.6);Muscle weakness (generalized) (M62.81);Difficulty in walking, not elsewhere classified (R26.2) ?  ? ? ?Time: 9150-5697 ?PT Time Calculation (min) (ACUTE ONLY): 45 min ? ?Charges:  $Gait Training: 23-37 mins          ?          ? ?Amanda Cockayne, PT, GCS ?06/26/21,12:31 PM ? ?

## 2021-06-26 NOTE — Op Note (Signed)
?[name]. ?male ?76 y.o. ?06/25/2021 ? ?PreOperative Diagnosis: ?Right subacute trimalleolar ankle fracture ? ?PostOperative Diagnosis: ?Right subacute trimalleolar ankle fracture ?Right syndesmotic disruption ? ? ?PROCEDURE: ?Open reduction internal fixation of right trimalleolar ankle fracture without posterior fixation ?Open reduction internal fixation of syndesmosis ?Ankle stress view fluoroscopy ? ? ?SURGEON: Melony Overly, MD ? ?ASSISTANT: Jesse Martinique, PA-C: His assistance was necessary for prep and drape, exposure, holding retractors, provisional and definitive fixation, placement of hardware, wound closure and splinting.  ? ?ANESTHESIA: General LMA anesthesia with peripheral nerve block ? ?FINDINGS: ?Subacute displaced lateral malleolus fracture and medial malleolus fracture.  Minimally displaced posterior malleolar fracture. Syndesmosis disruption ? ?IMPLANTS: ?Arthrex distal fibular locking plate, 4.0 mm cannulated screws, fully threaded syndesmosis screws ? ?INDICATIONS:75 y.o. malesustained an ankle fracture after a fall from his bed approximately 5 weeks ago.  He was initially seen in the urgent care and opted for nonoperative treatment.  Then he did some research on his own and decided to have it fixed.  He was seen in the emergency department and admitted to the hospitalist team.  He was indicated for surgery given the amount of displacement and instability pattern of his ankle.  There is concern for syndesmotic disruption due to the fracture pattern.  Patient does have a history of diabetes and has some neuropathy.  He understood the increased risk of poor outcome given this history.  He also is a smoker.  We discussed these risks as well.  He opted to proceed.   Patient understood the risks, benefits and alternatives to surgery which include but are not limited to wound healing complications, infection, nonunion, malunion, need for further surgery as well as damage to surrounding  structures. They also understood the potential for continued pain in that there were no guarantees of acceptable outcome After weighing these risks the patient opted to proceed with surgery. ? ?PROCEDURE: ?Patient was identified in the preoperative holding area.  The right leg was marked by myself.  Consent was signed by myself and the patient.  Block was performed by anesthesia in the preoperative holding area.  Patient was taken to the operative suite and placed supine on the operative table.  General LMA anesthesia was induced without difficulty. Bump was placed under the operative hip and bone foam was used.  All bony prominences were well padded.  Tourniquet was placed on the operative thigh.  Preoperative antibiotics were given. The extremity was prepped and draped in the usual sterile fashion and surgical timeout was performed.  The limb was elevated and the tourniquet was inflated to 250 mmHg. ? ?Patient was identified in the preoperative holding area.  The '[]'$  leg was marked by myself.  Consent was signed by myself and the patient.  Block was performed by anesthesia in the preoperative holding area.  Patient was taken to the operative suite and placed supine on the operative table.  General LMA anesthesia was induced without difficulty. Bump was placed under the operative hip and bone foam was used.  All bony prominences were well padded.  Tourniquet was placed on the operative thigh.  Preoperative antibiotics were given. The extremity was prepped and draped in the usual sterile fashion and surgical timeout was performed.  The limb was elevated and the tourniquet was inflated to 250 mmHg. ? ?We began by making a longitudinal incision overlying the fibula.  This was taken sharply down through skin and subcutaneous tissue.  Blunt dissection was used to identify any branch of the  superficial peroneal nerve that was  not identified in the surgical field.  The incision was then taken sharply down to bone and the  fracture site was identified. There was a significant amount of fibrous and consolidated material about the fracture site.  It was difficult to identify the fracture but due to the displacement we are able to fully identify the fracture.  It took several minutes to take down the fracture site that was partially healed.  There was significant amount of extra work and time involved in removing the fibrous hematoma, calcified membrane within the interosseous space as well as mobilizing the fracture site. The fracture site was mobilized.   The fracture sites were cleaned with a rondure and curette of any fracture callus, fibrous tissue, calcified material.  After fully removing the callus material and adhesions the fracture of the fibula was reduced under direct visualization and held provisionally with a lobster claw.  Then fluoroscopy confirmed adequate reduction of the ankle mortise at that time.  A lag screw was placed across the fracture site by technique.Then a distal fibular locking plate was placed across the fracture site for fixation.  This provided good stability of the distal fibula fracture.   ? ?We then turned our attention to the medial malleolus.  There was continued displacement of the medial malleolus and therefore an incision was made overlying this.  This was taken sharply down through skin and subcutaneous tissue.  Then the fracture was reduced and held provisionally with a pointed reduction forcep.  This was done under direct visualization.  Then fluoroscopy confirmed adequate reduction.  2 4.0 mm partially-threaded cannulated screws were placed across the fracture site with good fixation.   ? ?We then proceeded with stress view fluoroscopy of the ankle.  It was noted on stress views that there is widening of the syndesmosis and medial clear space.  We then proceeded with open treatment of the syndesmosis. ? ?Separate deep incision was created distally along the level of the syndesmosis.  We would  gain access to the syndesmosis and the ligaments had been torn.  The syndesmosis was in cleared of any interposed tissue using a rongeur and a Soil scientist.  Significant amount of time was taken to remove the interposed fibrous tissue and callus that had formed within the syndesmosis and interosseous membrane.  Then the syndesmosis was reduced under direct position and held provisionally with a Weber clamp.  Fluoroscopy confirmed appropriate position of the syndesmosis and closing down the mortise.  We then proceeded placed 2 fully threaded 3.5 millimeter screws through the plate into the medial cortex of the tibia.  This provided good stability of the syndesmosis.  Postplacement stress views were stable. ? ?Then lateral x-rays were obtained and the posterior malleolar fragment appeared well reduced and not amenable to internal fixation.We decided to not place internal fixation on the posterior malleolar fragment. ? ?Final films were obtained. ? ?The wounds were irrigated and the subcuticular tissue was closed with 3-0 Monocryl and the skin with staples.  Xeroform was placed on the wounds as well as 4 x 4's and sterile sheet cotton.  The tourniquet was released.   ? ?Patient was placed in a nonweightbearing short leg splint.  Patient tolerated the procedure well.  There were no complications.  Patient was awakened from anesthesia and taken recovery in stable condition. ? ?POST OPERATIVE INSTRUCTIONS: ?Touchdown weightbearing on operative extremity ?Keep splint dry and limb elevated ?Continue his baseline Plavix and aspirin for DVT prophylaxis ?  Okay to restart postop day 1 ?Follow-up in 2 weeks for splint removal, x-rays of the operative ankle, nonweightbearing and suture removal if appropriate.   ? ? ?TOURNIQUET TIME:60 minutes ? ?BLOOD LOSS:  less than 50 mL ?        ?DRAINS: none ?        ?SPECIMEN: none ?      ?COMPLICATIONS:  * No complications entered in OR log * ?        ?Disposition: PACU - hemodynamically  stable. ?        ?Condition: stable  ?

## 2021-06-26 NOTE — Progress Notes (Addendum)
? ? ? ?  Brent Hair. is a 76 y.o. male  ? ?Orthopaedic diagnosis: Right subacute trimalleolar ankle fracture with syndesmosis disruption ? ?Surgery: Open treatment of right subacute trimalleolar ankle fracture with syndesmotic fixation, 06/25/2021 ? ?Subjective: ?Patient appears comfortable in bed.  He is looking forward to eating breakfast.  He is having no pain.  Reports normal bladder function, but has not had a bowel movement yet.  No nausea or vomiting.  He has not been out of bed yet, but is looking forward to working with physical therapy today.  He again feels that he would benefit from SNF placement. ? ?Objectyive: ?Vitals:  ? 06/26/21 0239 06/26/21 0440  ?BP:  117/74  ?Pulse: 80 84  ?Resp: 18 18  ?Temp: 97.8 ?F (36.6 ?C) 97.8 ?F (36.6 ?C)  ?SpO2: 97% 97%  ?  ? ?Exam: ?Awake and alert ?Respirations even and unlabored ?No acute distress ? ?Right lower extremity demonstrates a well fitted and intact lower extremity splint.  Sensation and mobility of the toes has not yet, secondary to peripheral nerve block yesterday. Toes are warm and well-perfused distally.  Unchanged onychomycosis and chronic nail changes noted.  Exposed skin is intact.  Range of motion at the knee is intact. ? ?Assessment: ?Postop day 1 status post the above, doing well ? ?Plan: ?Patient appears to be doing well. He has no pain. Peripheral nerve block is likely not worn off yet. Discussed that sensation and mobility in the toe should return over the next day or so.  We discussed the importance of weightbearing restrictions postoperatively. I also discussed the importance of smoking cessation and glycemic control for healing.  I spoke with his wife via phone throughout the encounter.  All questions answered. ? ?-Toe touchdown weightbearing right lower extremity with use of a walker ?-Out of bed with PT/OT today for assessment and recommendations for placement. ?-Keep splint clean, dry, and intact ?-Pain control PRN ?-Okay to resume  baseline DAPT from orthopedic standpoint.  ?-Follow-up with Dr. Lucia Gaskins ([place] [phone]) 2 weeks from surgery for x-rays, suture removal if appropriate, and likely placement of a walking boot. ? ? ?Brent Griffin J. Martinique, PA-C ? ? ?

## 2021-06-26 NOTE — [place] FL2 (Signed)
?[place] MEDICAID FL2 LEVEL OF CARE SCREENING TOOL  ?  ? ?IDENTIFICATION  ?Patient Name: ?Brent Griffin. Birthdate: [birth year] Sex: male Admission Date (Current Location): ?06/19/2021  ?[place] and [place] Number: ?   ?  Facility and Address:  ?[facility]. [facility], [place] [address], [place], [place] [address] ?     Provider Number: ?   ?Attending Physician Name and Address:  ?Lottie Mussel, MD ? Relative Name and Phone Number:  ?Jaskaran Dauzat, [phone] ?   ?Current Level of Care: ?Hospital Recommended Level of Care: ?[place] Prior Approval Number: ?  ? ?Date Approved/Denied: ?  PASRR Number: ?[id number] ? ?Discharge Plan: ?SNF ?  ? ?Current Diagnoses: ?Patient Active Problem List  ? Diagnosis Date Noted  ? Trimalleolar fracture of ankle, closed, right, initial encounter 06/20/2021  ? Diarrhea 06/06/2020  ? Change in bowel habits 06/06/2020  ? Surgery, elective   ? Acute metabolic encephalopathy 24/58/0998  ? Closed fracture of left distal humerus 04/01/2020  ? Hypokalemia 03/13/2018  ? Adjustment insomnia 03/10/2018  ? Renal insufficiency 03/10/2018  ? Tobacco use disorder 03/10/2018  ? Neck pain 12/01/2017  ? 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 Urinary hesitancy 09/08/2017  ? Prostate cancer ([place]) 01/16/2016  ? Hyperlipidemia LDL goal <100 08/27/2014  ? Hyperlipidemia associated with type 2 diabetes mellitus ([place]) 08/27/2014  ? Muscle spasm of back 02/13/2014  ? Rosacea 02/13/2014  ? Scoliosis 02/13/2014  ? Essential hypertension 02/12/2014  ? Elevated prostate specific antigen (PSA) 01/06/2014  ? Muscle spasms of neck 12/17/2013  ? Type II diabetes mellitus ([place]) 05/13/2013  ? ? ?Orientation RESPIRATION BLADDER Height & Weight   ?  ?Self, Time, Situation, Place ? Normal Continent Weight: 174 lb 13.2 oz (79.3 kg) ?Height:  '5\' 10"'$  (177.8 cm)  ?BEHAVIORAL SYMPTOMS/MOOD NEUROLOGICAL BOWEL NUTRITION STATUS  ?    Continent Diet (See DC summary)   ?AMBULATORY STATUS COMMUNICATION OF NEEDS Skin   ?Limited Assist Verbally Normal ?  ?  ?  ?    ?     ?     ? ? ?Personal Care Assistance Level of Assistance  ?Bathing, Feeding, Dressing Bathing Assistance: Limited assistance ?Feeding assistance: Independent ?Dressing Assistance: Limited assistance ?   ? ?Functional Limitations Info  ?Sight, Hearing, Speech Sight Info: Adequate ?Hearing Info: Adequate ?Speech Info: Adequate  ? ? ?SPECIAL CARE FACTORS FREQUENCY  ?PT (By licensed PT), OT (By licensed OT)   ?  ?PT Frequency: 5x week ?OT Frequency: 5x week ?  ?  ?  ?   ? ? ?Contractures Contractures Info: Not present  ? ? ?Additional Factors Info  ?Code Status, Allergies Code Status Info: Full ?Allergies Info: Donepezil ?  ?  ?  ?   ? ?Current Medications (06/26/2021):  This is the current hospital active medication list ?Current Facility-Administered Medications  ?Medication Dose Route Frequency Provider Last Rate Last Admin  ? acetaminophen (TYLENOL) tablet 650 mg  650 mg Oral Q6H PRN Martinique, Jesse J, PA-C      ? Or  ? acetaminophen (TYLENOL) suppository 650 mg  650 mg Rectal Q6H PRN Martinique, Jesse J, PA-C      ? amLODipine (NORVASC) tablet 10 mg  10 mg Oral Daily Martinique, Jesse J, [place]   10 mg at 06/26/21 3382  ? aspirin EC tablet 81 mg  81 mg Oral Daily Demaio, Alexa, MD      ? Chlorhexidine Gluconate Cloth 2 % PADS 6 each  6 each Topical Q0600 Martinique, Jesse J, [place]   6 each at 06/26/21 0272  ? clopidogrel (PLAVIX) tablet 75 mg  75 mg Oral Daily Demaio, Alexa, MD      ? docusate sodium (COLACE) capsule 100 mg  100 mg Oral BID Martinique, Jesse J, PA-C   100 mg at 06/26/21 0813  ? HYDROcodone-acetaminophen (NORCO) 7.5-325 MG per tablet 1-2 tablet  1-2 tablet Oral Q4H PRN Martinique, Jesse J, PA-C      ? HYDROcodone-acetaminophen (NORCO/VICODIN) 5-325 MG per tablet 1-2 tablet  1-2 tablet Oral Q4H PRN Martinique, Jesse J, PA-C      ? losartan (COZAAR) tablet 100 mg  100 mg Oral Daily Martinique, Jesse J, PA-C   100 mg at 06/26/21 5366  ?  melatonin tablet 3 mg  3 mg Oral QHS PRN Martinique, Jesse J, PA-C   3 mg at 06/25/21 2142  ? memantine (NAMENDA) tablet 5 mg  5 mg Oral Daily Martinique, Jesse J, [place]   5 mg at 06/26/21 0820  ? metoCLOPramide (REGLAN) tablet 5-10 mg  5-10 mg Oral Q8H PRN Martinique, Jesse J, PA-C      ? Or  ? metoCLOPramide (REGLAN) injection 5-10 mg  5-10 mg Intravenous Q8H PRN Martinique, Jesse J, PA-C      ? metoprolol succinate (TOPROL-XL) 24 hr tablet 50 mg  50 mg Oral QHS Martinique, Jesse J, [place]   50 mg at 06/25/21 2139  ? morphine (PF) 2 MG/ML injection 0.5-1 mg  0.5-1 mg Intravenous Q2H PRN Martinique, Jesse J, PA-C      ? mupirocin ointment (BACTROBAN) 2 % 1 application.  1 application. Nasal BID Martinique, Jesse J, PA-C   1 application. at 06/26/21 0818  ? nicotine (NICODERM CQ - dosed in mg/24 hours) patch 14 mg  14 mg Transdermal Daily Martinique, Jesse J, [place]   14 mg at 06/26/21 0818  ? ondansetron (ZOFRAN) tablet 4 mg  4 mg Oral Q6H PRN Martinique, Jesse J, PA-C      ? Or  ? ondansetron (ZOFRAN) injection 4 mg  4 mg Intravenous Q6H PRN Martinique, Jesse J, PA-C      ? potassium chloride SA (KLOR-CON M) CR tablet 20 mEq  20 mEq Oral BID Martinique, Jesse J, PA-C   20 mEq at 06/26/21 0813  ? predniSONE (DELTASONE) tablet 10 mg  10 mg Oral Daily Martinique, Jesse J, [place]   10 mg at 06/26/21 0813  ? rosuvastatin (CRESTOR) tablet 20 mg  20 mg Oral QHS Martinique, Jesse J, [place]   20 mg at 06/25/21 2139  ? spironolactone (ALDACTONE) tablet 12.5 mg  12.5 mg Oral Daily Martinique, Jesse J, PA-C   12.5 mg at 06/26/21 4403  ? tiZANidine (ZANAFLEX) tablet 2 mg  2 mg Oral Q6H PRN Martinique, Jesse J, PA-C      ? vitamin B-12 (CYANOCOBALAMIN) tablet 1,000 mcg  1,000 mcg Oral Daily Martinique, Jesse J, PA-C   1,000 mcg at 06/26/21 4742  ? ? ? ?Discharge Medications: ?Please see discharge summary for a list of discharge medications. ? ?Relevant Imaging Results: ? ?Relevant Lab Results: ? ? ?Additional Information ?SS# [id number] ? ?Joanne Chars, LCSW ? ? ? ? ?

## 2021-06-26 NOTE — Progress Notes (Signed)
? ?  Inpatient Rehab Admissions Coordinator : ? ?Per therapy recommendations patient was screened for CIR candidacy by Wanisha Shiroma RN MSN. Patient does not appear to demonstrate the medical neccesity for a Hospital Rehabilitation /CIR admit. I will not place a Rehab Consult. Recommend other Rehab Venues to be pursued. Please contact me with any questions. ? ?Emmylou Bieker RN MSN ?Admissions Coordinator ?[phone]  ?

## 2021-06-27 DIAGNOSIS — S82851A Displaced trimalleolar fracture of right lower leg, initial encounter for closed fracture: Secondary | ICD-10-CM | POA: Diagnosis not present

## 2021-06-27 NOTE — Progress Notes (Signed)
? ? ?HD#7 ?Subjective:  ?Overnight Events: NAEO ? ? States having a little bit of pain around ankle, does not want pain medications just yet. Does not want to go to [facility]. Wants to choose another SNF. SW will not be back until Monday. Discussed working with PT in the meantime.  ? ?Objective:  ?Vital signs in last 24 hours: ?Vitals:  ? 06/26/21 2119 06/26/21 2235 06/27/21 0709 06/27/21 0757  ?BP: 129/76 108/70 140/75 (!) 144/84  ?Pulse:  65 62 62  ?Resp:  '16 18 17  '$ ?Temp:  98.2 ?F (36.8 ?C) 98.2 ?F (36.8 ?C) 98 ?F (36.7 ?C)  ?TempSrc:  Oral Oral Oral  ?SpO2:  94% 96% 99%  ?Weight:      ?Height:      ? ?Supplemental O2: Room Air ?SpO2: 99 % ?O2 Flow Rate (L/min): 7 L/min ? ? ?Physical Exam:  ?Constitutional: Elderly man resting comfortably in bed, in no acute distress ?HEENT: normocephalic atraumatic, mucous membranes moist, conjunctiva non-erythematous ?Cardiovascular: regular rate and rhythm, no m/r/g ?Pulmonary/Chest: normal work of breathing on room air ?Abdominal: soft, non-tender, non-distended ?MSK: normal bulk and tone, RLE wrapped in bandage, able to move all extremities, wiggles toes ?Neurological: alert & oriented x 3, answering questions appropriately ?Skin: warm and dry ?Psych: normal affect ?  ? ?Filed Weights  ? 06/23/21 1500  ?Weight: 79.3 kg  ? ? ? ?Intake/Output Summary (Last 24 hours) at 06/27/2021 1142 ?Last data filed at 06/26/2021 1300 ?Gross per 24 hour  ?Intake 240 ml  ?Output 500 ml  ?Net -260 ml  ? ?Net IO Since Admission: -6,545 mL [06/27/21 1142] ? ?Pertinent Labs: ? ?  Latest Ref Rng & Units 06/26/2021  ?  3:41 AM 06/25/2021  ?  3:49 AM 06/21/2021  ?  1:58 AM  ?CBC  ?WBC 4.0 - 10.5 K/uL 7.8   5.5   8.9    ?Hemoglobin 13.0 - 17.0 g/dL 13.1   13.2   13.8    ?Hematocrit 39.0 - 52.0 % 39.8   40.0   41.8    ?Platelets 150 - 400 K/uL 191   178   154    ? ? ? ?  Latest Ref Rng & Units 06/26/2021  ?  3:41 AM 06/25/2021  ?  3:49 AM 06/23/2021  ?  1:35 AM  ?CMP  ?Glucose 70 - 99 mg/dL 118   102   87     ?BUN 8 - 23 mg/dL '18   19   18    '$ ?Creatinine 0.61 - 1.24 mg/dL 0.90   0.82   0.88    ?Sodium 135 - 145 mmol/L 138   137   137    ?Potassium 3.5 - 5.1 mmol/L 4.5   4.0   4.0    ?Chloride 98 - 111 mmol/L 107   109   105    ?CO2 22 - 32 mmol/L '24   23   24    '$ ?Calcium 8.9 - 10.3 mg/dL 9.1   8.6   8.5    ? ? ?Imaging: ?No results found. ? ?Assessment/Plan:  ? ?Principal Problem: ?  Trimalleolar fracture of ankle, closed, right, initial encounter ? ? ?Patient Summary: ?Brent Griffin. is a 76 y.o. 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 ?  ?Trimalleolar fracture of  R ankle s/p syndesmotic fixation on 3/23 ?Patient medically stable for SNF. Unfortunately does not qualify for CIR based on this injury. Encouraged patient to continue to mobilize in the hospital while waiting on placement. Patient and wife want to pursue other options than [facility]. Will reach out to SW though it likely won't be until Monday.  ?- continue PT OT ?- restarted DAPT ?- SNF placement ?- 2 week ortho follow up ?  ?Suspected osteoporosis ?Patient with a ground level fall and fracture indicating osteoporosis. Will need to follow up with PCP as an outpatient. This is made higher risk by patient's use of chronic steroids for PMR ?- outpatient follow up ?  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 He was asymptomatic. EKG showing 2nd degree SA block Mobitz type 1 with 4:3 and 3:2 cycles. Discussed with cardiology, who advised to reduce home metoprolol dose to '50mg'$  qhs. ?-greatly appreciate Dr. Victorino December assistance ?-reduced home toprol-xl to '50mg'$  qhs ?  ?CAD s/p cardiac cath ?HTN ?Cardiac cath with stent placement in 01/2021. Done as elective procedure.  Resumed home norvasc, losartan, and spironolactone. Decreased toprol-xl dose to '50mg'$  qhs (see above).  ?- restart dapt 3/25 ?  ?PMR ?Chronic R knee pain ?-continue home prednisone '10mg'$  daily ?  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 ?-continue home memantine '5mg'$  daily ?  ?Hypokalemia, chronic ?States this is a chronic issue for him. Takes spironolactone and potassium supplementation at home for this. ?-continue spironolactone ?-continue home klor-con 18mq BID ?  ?Prior to Admission Living Arrangement: Home ?Anticipated Discharge Location: SNF ?VTE: aspirin plavix ?Barriers to Discharge: medically stable pending placement ?Dispo: Anticipated discharge in approximately 1-2 day(s).  ? ?AScarlett Presto MD ?Internal Medicine Resident PGY-1 ?Pager [phone]?Please contact the on call pager after 5 pm and on weekends at [phone] ? ?

## 2021-06-27 NOTE — Progress Notes (Signed)
? ? ? ?  Brent Griffin. is a 76 y.o. male  ? ?Orthopaedic diagnosis: Right subacute trimalleolar ankle fracture with syndesmosis disruption ? ?Surgery: Open treatment of right subacute trimalleolar ankle fracture with syndesmotic fixation, 06/25/2021 ? ?Subjective: ?Patient appears comfortable in bed.  Reports sensation and mobility of the toes have returned. Some mild pain about the lateral aspect of the ankle, but is certainly tolerable.  He does have some questions regarding exactly what touchdown weightbearing means.  He has been working with physical therapy.  He states his wife did visit [facility], and felt that he would not be comfortable there.  He is requesting to be placed at a different facility.  Reports normal bowel and bladder function.  Eating and drinking well.  No nausea or vomiting.  No new concerns. ? ?Objectyive: ?Vitals:  ? 06/27/21 0709 06/27/21 0757  ?BP: 140/75 (!) 144/84  ?Pulse: 62 62  ?Resp: 18 17  ?Temp: 98.2 ?F (36.8 ?C) 98 ?F (36.7 ?C)  ?SpO2: 96% 99%  ?  ? ?Exam: ?Awake and alert ?Respirations even and unlabored ?No acute distress ? ?Right lower extremity demonstrates a well fitted and intact lower extremity splint.  Unchanged onychomycosis and chronic nail changes noted.  He is able to wiggle the toes today.  Endorses some altered sensation to light touch, which may secondary to his baseline neuropathy.  No tenderness distally or proximal to the splint.  Exposed skin is intact.  Range of motion at the knee is intact.  The toes are warm and well-perfused distally. ? ? ?Assessment: ?Postop day 2 status post above, doing well ? ? ?Plan: ?Discussed and demonstrated toe touchdown weightbearing with the patient.  He was understanding.  Suitable for discharge from an orthopedic standpoint once cleared by physical therapy and bed available. ? ?-Toe touchdown weightbearing right lower extremity with use of a walker ?-Continue working with PT/OT ?-Keep splint clean, dry, and intact ?-Pain  control PRN ?-Okay to resume baseline DAPT from orthopedic standpoint.  ?-Smoking cessation and good glycemic control for healing. ?-Follow-up with Dr. Lucia Gaskins ([place] [phone]) 2 weeks from surgery for x-rays, suture removal if appropriate, and likely placement of a walking boot. ?  ? ? ?Quasean Frye J. Martinique, PA-C ? ? ?

## 2021-06-27 NOTE — Plan of Care (Signed)

## 2021-06-28 MED ORDER — OXYCODONE HCL 5 MG PO TABS: 5.0000 mg | ORAL_TABLET | ORAL | 0 refills | Status: AC | PRN

## 2021-06-28 NOTE — TOC Progression Note (Addendum)
Transition of Care (TOC) - Progression Note  ? ? ?Patient Details  ?Name: Brent Griffin. ?MRN: [id number] ?Date of Birth: Jan 30, 1946 ? ?Transition of Care (TOC) CM/SW Contact  ?Joanne Chars, LCSW ?Phone Number: ?06/28/2021, 9:17 AM ? ?Clinical Narrative:   CSW informed that pt and wife have decided against [facility] after visiting.  CSW went over bed offers with pt in room and with wife on the phone, she will research the other options and call back.  ? ?1015: TC Becky, she would like to choose [facility].  Message sent to [facility] to see if they could accept pt today.  ? ?1345: Response from [facility]: they cannot take pt today but can admit tomorrow. ? ?Expected Discharge Plan:  (TBD) ?Barriers to Discharge: Continued Medical Work up, Other (must enter comment) (pending surgery thursday) ? ?Expected Discharge Plan and Services ?Expected Discharge Plan:  (TBD) ?In-house Referral: Clinical Social Work ?  ?Post Acute Care Choice:  (TBD) ?Living arrangements for the past 2 months: [place] ?                ?  ?  ?  ?  ?  ?  ?  ?  ?  ?  ? ? ?Social Determinants of Health (SDOH) Interventions ?  ? ?Readmission Risk Interventions ?   ? View : No data to display.  ?  ?  ?  ? ? ?

## 2021-06-28 NOTE — Progress Notes (Signed)
? ? ? ?  Brent Griffin. is a 76 y.o. male  ? ?Orthopaedic diagnosis: Right subacute trimalleolar ankle fracture with syndesmosis disruption ? ?Surgery: Open treatment of right subacute trimalleolar ankle fracture with syndesmotic fixation, 06/25/2021 ? ?Subjective: ?Patient appears comfortable sitting in chair.  He states he only has bowel pain laterally about the ankle.  He has been ambulatory with use of a walker and has reported compliance with toe-touch weightbearing only in the right lower extremity.  He has no new concerns.  He reports he is now planning to be discharged to [facility].  He is very much looking forward to discharge.  No new concerns. ? ?Objectyive: ?Vitals:  ? 06/28/21 0525 06/28/21 0800  ?BP: 124/87 (!) 141/85  ?Pulse: 68 70  ?Resp:  17  ?Temp: 97.6 ?F (36.4 ?C) 98.3 ?F (36.8 ?C)  ?SpO2: 96% 98%  ?  ? ?Exam: ?Awake and alert ?Respirations even and unlabored ?No acute distress ? ?Right lower extremity demonstrates a well fitted and intact lower extremity splint.  Unchanged onychomycosis and chronic nail changes.  He is able to wiggle his toes.Endorses some altered sensation to light touch, which may secondary to his baseline neuropathy.  No tenderness distally or proximal to the splint.  Exposed skin is intact.  Range of motion at the knee is intact.  The toes are warm and well-perfused distally. ? ?Assessment: ?Postop day 3 status post above, doing well and suitable for discharge to SNF from an orthopedic standpoint ? ? ?Plan: ?-Toe touchdown weightbearing right lower extremity with use of a walker ?-Continue working with PT/OT ?-Keep splint clean, dry, and intact ?-Oxycodone prescription printed and signed and placed in the patient's chart for discharge. ?-Baseline DAPT for DVT prophylaxis ?-Smoking cessation and good glycemic control for healing. ?-Follow-up with Dr. Lucia Gaskins ([place] [phone]) 2 weeks from surgery for x-rays, suture removal if appropriate, and  likely placement of a walking boot. ?  ? ? ?Nykiah Ma J. Martinique, PA-C ? ? ?

## 2021-06-28 NOTE — Discharge Instructions (Addendum)
Brent Griffin ? ?You were recently admitted to [facility] for an ankle fracture. You had surgery with the orthopedic doctors and will continue working with PT to get stronger at the rehab.   ? ?Continue taking your home medications with the following change ?Your metoprolol dose decreased to 50 mg. Now only take 50 mg once a day/ ? ?You should seek further medical care if you having worsening pain, difficulty moving your leg, or worsening weakness.. ? ?We recommend that you see your primary care doctor in about a week to make sure that you continue to improve. We are so glad that you are feeling better. ? ?Sincerely, ?Scarlett Presto, MD ? ? ? ? ? ?[facility] POST-OP INSTRUCTIONS ? ? ?Pain Management ?The numbing medicine and your leg will last around 18 hours, take a dose of your pain medicine as soon as you feel it wearing off to avoid rebound pain. ?Keep your foot elevated above heart level.  Make sure that your heel hangs free ('floats'). ?Take all prescribed medication as directed. ?If taking narcotic pain medication you may want to use an over-the-counter stool softener to avoid constipation. ?You may take over-the-counter NSAIDs (ibuprofen, naproxen, etc.) as well as over-the-counter acetaminophen as directed on the packaging as a supplement for your pain if medically appropriate and may also use it to wean away from the prescription medication. ? ?Activity ?Toe-touchdown weightbearing right leg ?Keep splint intact ? ?First Postoperative Visit ?Your first postop visit will be at least 2 weeks after surgery.  This should be scheduled when you schedule surgery. ?If you do not have a postoperative visit scheduled please call [phone] to schedule an appointment. ?At the appointment your incision will be evaluated for suture removal, x-rays will be obtained if necessary. ? ?General Instructions ?Swelling is very common after foot and ankle surgery.  It often takes 3 months for the  foot and ankle to begin to feel comfortable.  Some amount of swelling will persist for 6-12 months. ?DO NOT change the dressing.  If there is a problem with the dressing (too tight, loose, gets wet, etc.) please contact Dr. Pollie Friar office. ?DO NOT get the dressing wet.  For showers you can use an over-the-counter cast cover or wrap a washcloth around the top of your dressing and then cover it with a plastic bag and tape it to your leg. ?DO NOT soak the incision (no tubs, pools, bath, etc.) until you have approval from Dr. Lucia Gaskins. ? ?Contact Dr. Huel Cote office or go to Emergency Room if: ?Temperature above 101? F. ?Increasing pain that is unresponsive to pain medication or elevation ?Excessive redness or swelling in your foot ?Dressing problems - excessive bloody drainage, looseness or tightness, or if dressing gets wet ?Develop pain, swelling, warmth, or discoloration of your calf ? ?

## 2021-06-28 NOTE — Progress Notes (Signed)
? ? ?HD#8 ?Subjective:  ?Overnight Events: NAEON. ? ?Doing well this morning. Continues to have a dull pain around surgical site, controlled with pain medications which he needed once last night and once this morning.  ? ?States that they have chosen [facility], hoping he can be discharged there today. Spoke with SW earlier. ? ?Objective:  ?Vital signs in last 24 hours: ?Vitals:  ? 06/27/21 1534 06/27/21 2034 06/28/21 0525 06/28/21 0800  ?BP: 107/74 133/76 124/87 (!) 141/85  ?Pulse: 74 78 68 70  ?Resp: '17 18  17  '$ ?Temp: 98.1 ?F (36.7 ?C) 98.6 ?F (37 ?C) 97.6 ?F (36.4 ?C) 98.3 ?F (36.8 ?C)  ?TempSrc: Oral Oral  Oral  ?SpO2: 96% 97% 96% 98%  ?Weight:      ?Height:      ? ?Supplemental O2: Room Air ?SpO2: 98 % ?O2 Flow Rate (L/min): 7 L/min ? ? ?Physical Exam:  ?General: elderly male, resting comfortably in chair, NAD. ?CV: normal rate and regular rhythm, no m/r/g. Toes in RLE warm and well-perfused. ?Pulm: CTABL, normal work of breathing on RA. ?Abdomen: soft, nondistended, nontender, normoactive bowel sounds. ?MSK: normal bulk and tone, RLE in splint c/d/I, able to move RLE and wiggle toes. ?Neuro: AAOx3 ?Skin: warm and dry ?Psych: normal mood and affect. ?  ? ?Filed Weights  ? 06/23/21 1500  ?Weight: 79.3 kg  ? ? ? ?Intake/Output Summary (Last 24 hours) at 06/28/2021 1338 ?Last data filed at 06/28/2021 1100 ?Gross per 24 hour  ?Intake 240 ml  ?Output 651 ml  ?Net -411 ml  ? ?Net IO Since Admission: -7,086 mL [06/28/21 1338] ? ?Pertinent Labs: ? ?  Latest Ref Rng & Units 06/26/2021  ?  3:41 AM 06/25/2021  ?  3:49 AM 06/21/2021  ?  1:58 AM  ?CBC  ?WBC 4.0 - 10.5 K/uL 7.8   5.5   8.9    ?Hemoglobin 13.0 - 17.0 g/dL 13.1   13.2   13.8    ?Hematocrit 39.0 - 52.0 % 39.8   40.0   41.8    ?Platelets 150 - 400 K/uL 191   178   154    ? ? ? ?  Latest Ref Rng & Units 06/26/2021  ?  3:41 AM 06/25/2021  ?  3:49 AM 06/23/2021  ?  1:35 AM  ?CMP  ?Glucose 70 - 99 mg/dL 118   102   87    ?BUN 8 - 23 mg/dL '18   19   18    '$ ?Creatinine 0.61  - 1.24 mg/dL 0.90   0.82   0.88    ?Sodium 135 - 145 mmol/L 138   137   137    ?Potassium 3.5 - 5.1 mmol/L 4.5   4.0   4.0    ?Chloride 98 - 111 mmol/L 107   109   105    ?CO2 22 - 32 mmol/L '24   23   24    '$ ?Calcium 8.9 - 10.3 mg/dL 9.1   8.6   8.5    ? ? ?Imaging: ?No results found. ? ?Assessment/Plan:  ? ?Principal Problem: ?  Trimalleolar fracture of ankle, closed, right, initial encounter ? ? ?Patient Summary: ?Brent Griffin. is a 76 y.o. 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 ?  ?Trimalleolar fracture of R  ankle s/p syndesmotic fixation on 3/23 ?Per ortho, remains toe touchdown weightbearing in RLE with use of a walker. Encouraged patient to continue to mobilize in the hospital while waiting on placement. Patient is medically stable for discharge, has selected [facility], has bed available for tomorrow. ?- continue PT OT while here ?- 2 week ortho follow up w/ Dr. Lucia Gaskins ([facility]) for x-rays, possible suture removal, and likely placement of a walking boot ?  ?Suspected osteoporosis ?Patient with a ground level fall and fracture indicating osteoporosis. Will need to follow up with PCP as an outpatient. This is made higher risk by patient's use of chronic steroids for PMR. ?- outpatient follow up ?  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 He was asymptomatic. EKG showing 2nd degree SA block Mobitz type 1 with 4:3 and 3:2 cycles. Discussed with cardiology, who advised to reduce home metoprolol dose to '50mg'$  qhs. ?-greatly appreciate Dr. Victorino December assistance ?-continue home toprol-xl to '50mg'$  qhs ?  ?CAD s/p cardiac cath ?HTN ?Cardiac cath with stent placement in 01/2021. Done as elective procedure. Resumed home  norvasc, losartan, and spironolactone. Decreased toprol-xl dose to '50mg'$  qhs (see above).  ?- continue DAPT ?  ?PMR ?Chronic R knee pain ?-continue home prednisone '10mg'$  daily ?  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 ?-continue home memantine '5mg'$  daily ?  ?Hypokalemia, chronic ?States this is a chronic issue for him. Takes spironolactone and potassium supplementation at home for this. ?-continue spironolactone ?-continue home klor-con 52mq BID ?  ?Prior to Admission Living Arrangement: Home ?[place]?VTE: aspirin plavix ?Barriers to Discharge: medically stable pending placement ?Dispo: Anticipated discharge in approximately 1 day.  ? ?SVirl Axe MD ?Internal Medicine Resident PGY-2 ?Pager [phone]?Please contact the on call pager after 5 pm and on weekends at [phone] ? ?

## 2021-06-28 NOTE — TOC Progression Note (Signed)
Transition of Care (TOC) - Progression Note  ? ? ?Patient Details  ?Name: Brent Griffin. ?MRN: [id number] ?Date of Birth: [birth year] ? ?Transition of Care (TOC) CM/SW Contact  ?[place], [place], [place] ?Phone Number: ?06/28/2021, 1:49 PM ? ?Clinical Narrative:    ?Family has selected [facility], patient accepted and will be able to discharge there tomorrow. No Covid test needed. ? ?[employer], LCSW ?Transition of Care ?[phone] ? ? ?Expected Discharge Plan:  (TBD) ?Barriers to Discharge: Continued Medical Work up, Other (must enter comment) (pending surgery thursday) ? ?Expected Discharge Plan and Services ?Expected Discharge Plan:  (TBD) ?In-house Referral: Clinical Social Work ?  ?Post Acute Care Choice:  (TBD) ?Living arrangements for the past 2 months: [place] ?                ?  ?  ?  ?  ?  ?  ?  ?  ?  ?  ? ? ?Social Determinants of Health (SDOH) Interventions ?  ? ?Readmission Risk Interventions ?   ? View : No data to display.  ?  ?  ?  ? ? ?

## 2021-06-29 DIAGNOSIS — S82851A Displaced trimalleolar fracture of right lower leg, initial encounter for closed fracture: Secondary | ICD-10-CM | POA: Diagnosis not present

## 2021-06-29 MED ORDER — METOPROLOL SUCCINATE ER 50 MG PO TB24: 50.0000 mg | ORAL_TABLET | Freq: Every day | ORAL | 0 refills | Status: DC

## 2021-06-29 NOTE — Progress Notes (Signed)
? ? ? ?  Brent Griffin. is a 76 y.o. male  ? ?Orthopaedic diagnosis: Right subacute trimalleolar ankle fracture with syndesmosis disruption ? ?Surgery: Open treatment of right subacute trimalleolar ankle fracture with syndesmotic fixation, 06/25/2021 ? ?Subjective: Patient is looking forward to being discharged today.  Pain is well controlled in terms of his right ankle.  He has noted some soreness in his shoulder, likely secondary to mobilization with a walker.  It is tolerable.  Reports compliance with toe touchdown only weightbearing restrictions right lower extremity.  Otherwise, no new complaints. ? ? ?Objectyive: ?Vitals:  ? 06/28/21 2054 06/29/21 0500  ?BP: (!) 130/92 122/80  ?Pulse: 92 87  ?Resp: 16 15  ?Temp: 97.9 ?F (36.6 ?C) 97.8 ?F (36.6 ?C)  ?SpO2: 99% 97%  ?  ? ?Exam: ?Awake and alert ?Respirations even and unlabored ?No acute distress ? ?Right lower extremity demonstrates a well fitted and intact lower extremity splint. Unchanged onychomycosis and chronic nail changes.  He is able to wiggle his toes. Endorses some altered sensation to light touch, which may secondary to his baseline neuropathy.  No tenderness distally or proximal to the splint.  Exposed skin is intact.  Range of motion at the knee is intact.  The toes are warm and well-perfused distally ? ?Assessment: ?Postop day 4 status post above, doing well and suitable for discharge to SNF from an orthopedic standpoint ? ? ?Plan: ?-Toe touchdown weightbearing right lower extremity with use of a walker ?-Continue working with PT/OT ?-Keep splint clean, dry, and intact ?-Oxycodone prescription printed and signed and placed in the patient's chart for discharge. ?-Baseline DAPT for DVT prophylaxis ?-Smoking cessation and good glycemic control for healing. ?-Follow-up with Dr. Lucia Gaskins ([place] [phone]) 2 weeks from surgery for x-rays, suture removal if appropriate, and likely placement of a walking boot. ? ? ?Aundreya Souffrant J. [place],  PA-C ? ? ?

## 2021-06-29 NOTE — Progress Notes (Signed)
Pt left the hospital under PTAR, AVS was given to them. Report was called to [facility], spoke to [employer]. ?

## 2021-06-29 NOTE — Discharge Summary (Signed)
? ?Name: Brent Griffin. ?MRN: [id number] ?DOB: 01-15-1946 76 y.o. ?PCP: Brent Bellows, PA-C ? ?Date of Admission: 06/19/2021  3:56 PM ?Date of Discharge:   06/29/21 ?Attending Physician: Brent Mussel, MD ? ?Discharge Diagnosis: ?1. Trimalleolar fracture of R ankle s/p syndesmotic fixation on 3/23 ?2. Suspected osteoporosis ?3. 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 CAD s/p cardiac cath ?5. HTN ?6. PMR ?7. Chronic R knee pain ?8. Memory loss, mild ?9. Hypokalemia, chronic ? ?Discharge Medications: ?Allergies as of 06/29/2021   ? ?   Reactions  ? Donepezil Other (See Comments)  ? Felt weird.   ? ?  ? ?  ?Medication List  ?  ? ?TAKE these medications   ? ?ALIGN PO ?Take 1 tablet by mouth every evening. ?  ?amLODipine 10 MG tablet ?Commonly known as: NORVASC ?Take 10 mg by mouth daily. ?  ?aspirin EC 81 MG tablet ?Take 81 mg by mouth daily. Swallow whole. ?  ?Cinnamon 500 MG capsule ?Take 500 mg by mouth daily. ?  ?clopidogrel 75 MG tablet ?Commonly known as: PLAVIX ?Take 75 mg by mouth daily. ?  ?diphenhydramine-acetaminophen 25-500 MG Tabs tablet ?Commonly known as: TYLENOL PM ?Take 2-3 tablets by mouth at bedtime as needed (sleep). ?  ?doxycycline 100 MG tablet ?Commonly known as: VIBRA-TABS ?Take 100 mg by mouth daily as needed (roscea outbreak). ?  ?folic acid 1 MG tablet ?Commonly known as: FOLVITE ?Take 1 mg by mouth daily. ?  ?loperamide 2 MG capsule ?Commonly known as: IMODIUM ?Take 2 mg by mouth as needed for diarrhea or loose stools. ?  ?losartan 100 MG tablet ?Commonly known as: COZAAR ?Take 100 mg by mouth daily. ?  ?memantine 5 MG tablet ?Commonly known as: NAMENDA ?Take 5 mg by mouth See admin instructions. Qd x 3 weeks, then 1 bid ?  ?metoprolol succinate 50 MG 24 hr tablet ?Commonly known as: TOPROL-XL ?Take 1 tablet (50 mg total) by mouth at bedtime. Take with or immediately following a meal. ?What changed:  ?medication strength ?how much to  take ?additional instructions ?  ?Multi-Vitamins Tabs ?Take 1 tablet by mouth daily. ?  ?naproxen 500 MG tablet ?Commonly known as: NAPROSYN ?Take 500 mg by mouth 2 (two) times daily as needed for moderate pain. ?  ?Omega 3 1000 MG Caps ?Take 2,000 mg by mouth daily. ?  ?OneTouch Delica Lancets 02H Misc ?USE ONE LANCET DAILY TO TEST BLOOD GLUCOSE ?  ?OneTouch Verio test strip ?Generic drug: glucose blood ?USE ONE STRIP DAILY TO TEST BLOOD GLUCOSE. ?  ?oxyCODONE 5 MG immediate release tablet ?Commonly known as: Roxicodone ?Take 1 tablet (5 mg total) by mouth every 4 (four) hours as needed for up to 7 days for severe pain. ?  ?potassium chloride SA 20 MEQ tablet ?Commonly known as: KLOR-CON M ?Take 20 mEq by mouth 2 (two) times daily. ?  ?predniSONE 5 MG tablet ?Commonly known as: DELTASONE ?Take 10 mg by mouth daily. ?  ?rosuvastatin 20 MG tablet ?Commonly known as: CRESTOR ?Take 20 mg by mouth at bedtime. ?  ?spironolactone 25 MG tablet ?Commonly known as: ALDACTONE ?Take 12.5 mg by mouth daily. ?  ?tiZANidine 2 MG tablet ?Commonly known as: ZANAFLEX ?Take 2 mg by mouth every 6 (six) hours as needed for muscle spasms. ?  ?traMADol 50 MG tablet ?Commonly known as: ULTRAM ?Take 50 mg by mouth every 6 (six) hours as needed for moderate pain. ?  ?TURMERIC PO ?  Take 1 tablet by mouth daily. ?  ?vitamin B-12 1000 MCG tablet ?Commonly known as: CYANOCOBALAMIN ?Take 1,000 mcg by mouth daily. ?  ?vitamin C 250 MG tablet ?Commonly known as: ASCORBIC ACID ?Take 250 mg by mouth daily. ?  ?zinc gluconate 50 MG tablet ?Take 50 mg by mouth daily. ?  ? ?  ? ? ?Disposition and follow-up:   ?Mr.Brent Griffin. was discharged from [facility] in Stable condition.  At the hospital follow up visit please address: ? ?1.  Osteoporosis/ankle fracture- Discuss treatment options, enquire how rehab after SNF is coming along ? ?2. CAD/HTN- metoprolol dose was changed to 50 mg daily. Assess HR ? ?3.  Labs / imaging needed  at time of follow-up: none ? ?4.  Pending labs/ test needing follow-up: none ? ?Follow-up Appointments: ? ? ?Hospital Course by problem list: ?1. Trimalleolar fracture of R ankle s/p syndesmotic fixation on 3/23 ?Patient had a close to ground level fall off bed. Came in and was found to have a trimalleolar fracture of his R ankle. Ortho was consulted and repaired this on 3/23. His DAPT was held until 24 hours after surgery and then restarted. He worked with PT/OT and was recommended to go to SNF to get further therapy.  ? ?2. Suspected osteoporosis ?Patient with a ground level fall and fracture indicating osteoporosis. Will need to follow up with PCP as an outpatient. This is made higher risk by patient's use of chronic steroids for PMR. ?  ?3. 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 He was asymptomatic. EKG showing 2nd degree SA block Mobitz type 1 with 4:3 and 3:2 cycles. Discussed with cardiology, who advised to reduce home metoprolol dose to '50mg'$  qhs. ?  ?4. CAD s/p cardiac cath ?HTN ?Cardiac cath with stent placement in 01/2021. Done as elective procedure. Resumed home norvasc, losartan, and spironolactone. Decreased toprol-xl dose to '50mg'$  qhs Continued DAPT after surgery. ?  ?5. PMR ?Chronic R knee pain ?Continued home prednisone '10mg'$  daily ?  ?6. Memory loss, mild ?MMSE 28/30 (09/2020). Continued home memantine '5mg'$  daily ?  ?7. Hypokalemia, chronic ?Takes spironolactone and potassium supplementation at home for this. Continued spironolactone and home klor-con 27mq BID ? ?Discharge Exam:   ?BP 134/73 (BP Location: Right Arm)   Pulse 71   Temp 97.9 ?F (36.6 ?C) (Oral)   Resp 16   Ht '5\' 10"'$  (1.778 m)   Wt 79.3 kg   SpO2 96%   BMI 25.08 kg/m?  ?Discharge exam:  ?General: elderly male, resting comfortably in chair, NAD. ?CV: normal rate and regular rhythm, no m/r/g. Toes in RLE warm and well-perfused. ?Pulm: CTABL, normal  work of breathing on RA. ?Abdomen: soft, nondistended, nontender, normoactive bowel sounds. ?MSK: normal bulk and tone, RLE in splint c/d/I, able to move RLE and wiggle toes. ?Neuro: alert, oriented, answering questions appropriately ?Skin: warm and dry ?Psych: normal mood and affect. ? ?Pertinent Labs, Studies, and Procedures:  ? ?CT Ankle Right Wo Contrast ? ?Result Date: 06/19/2021 ?CLINICAL DATA:  Ankle trauma, fracture. Patient broke is ankle about a month ago. Preop planning. EXAM: CT OF THE RIGHT ANKLE WITHOUT CONTRAST TECHNIQUE: Multidetector CT imaging of the right ankle was performed according to the standard protocol. Multiplanar CT image reconstructions were also generated. RADIATION DOSE REDUCTION: This exam was performed according to the departmental dose-optimization program which includes automated exposure control, adjustment of the  mA and/or kV according to patient size and/or use of iterative reconstruction technique. COMPARISON:  None. FINDINGS: Bones/Joint/Cartilage There is mildly displaced oblique fracture through the lateral malleolus. There is approximately 2 mm inferior displacement of the distal fracture fragment. There is mildly displaced fracture of the posterior malleolus with approximately 2.5 mm posterior displacement. There is also mildly displaced compression fracture of the medial malleolus with approximately 1 mm lateral displacement (series 8 image 25, series 6, image 32. There is small well corticated osseous fragments about the inferior aspect of the medial malleolus, likely sequela of prior trauma. There are also small osseous fragment on the lateral aspect of the distal tibia along the interosseous ligament, also likely sequela of prior trauma. Ligaments Suboptimally assessed by CT. Muscles and Tendons Muscles and tendons appear intact.  No intramuscular hematoma. Soft tissues Marked skin thickening and subcutaneous soft tissue edema prominent about the lateral aspect of the  foot. No drainable fluid collection. IMPRESSION: 1.  Acute trimalleolar right ankle fracture as described above. 2. Osseous fragments about the inferior aspect of the medial malleolus as well as between the distal tibia an

## 2021-06-29 NOTE — TOC Transition Note (Signed)
Transition of Care (TOC) - CM/SW Discharge Note ? ? ?Patient Details  ?Name: Brent Griffin. ?MRN: [id number] ?Date of Birth: [birth year] ? ?Transition of Care [facility]) CM/SW Contact:  ?Joanne Chars, LCSW ?Phone Number: ?06/29/2021, 10:17 AM ? ? ?Clinical Narrative: Pt discharging to [facility]. RN call [phone] for report.     ? ? ? ?Final next level of care: [facility] ?Barriers to Discharge: Barriers Resolved ? ? ?Patient Goals and CMS Choice ?Patient states their goals for this hospitalization and ongoing recovery are:: get back to where I was ?  ?  ? ?Discharge Placement ?  ?           ?Patient chooses bed at: [facility] ?Patient to be transferred to facility by: [facility] ?Name of family member notified: wife [name] ?Patient and family notified of of transfer: 06/29/21 ? ?Discharge Plan and Services ?In-house Referral: Clinical Social Work ?  ?Post Acute Care Choice:  (TBD)          ?  ?  ?  ?  ?  ?  ?  ?  ?  ?  ? ?Social Determinants of Health (SDOH) Interventions ?  ? ? ?Readmission Risk Interventions ?   ? View : No data to display.  ?  ?  ?  ? ? ? ? ? ?

## 2021-06-30 ENCOUNTER — Encounter (HOSPITAL_COMMUNITY): Payer: Self-pay | Admitting: Orthopaedic Surgery

## 2021-09-18 ENCOUNTER — Other Ambulatory Visit: Payer: Self-pay

## 2021-09-18 ENCOUNTER — Encounter (HOSPITAL_BASED_OUTPATIENT_CLINIC_OR_DEPARTMENT_OTHER): Payer: Self-pay

## 2021-09-18 ENCOUNTER — Emergency Department (HOSPITAL_BASED_OUTPATIENT_CLINIC_OR_DEPARTMENT_OTHER)
Admission: EM | Admit: 2021-09-18 | Discharge: 2021-09-18 | Disposition: A | Discharge status: Home / Self Care | Payer: Medicare Other | Attending: Emergency Medicine | Admitting: Emergency Medicine

## 2021-09-18 DIAGNOSIS — Z7982 Long term (current) use of aspirin: Secondary | ICD-10-CM | POA: Diagnosis not present

## 2021-09-18 DIAGNOSIS — S51012A Laceration without foreign body of left elbow, initial encounter: Secondary | ICD-10-CM | POA: Diagnosis not present

## 2021-09-18 DIAGNOSIS — Z7902 Long term (current) use of antithrombotics/antiplatelets: Secondary | ICD-10-CM | POA: Insufficient documentation

## 2021-09-18 DIAGNOSIS — W01198A Fall on same level from slipping, tripping and stumbling with subsequent striking against other object, initial encounter: Secondary | ICD-10-CM | POA: Diagnosis not present

## 2021-09-18 DIAGNOSIS — W19XXXA Unspecified fall, initial encounter: Secondary | ICD-10-CM

## 2021-09-18 DIAGNOSIS — Z23 Encounter for immunization: Secondary | ICD-10-CM | POA: Diagnosis not present

## 2021-09-18 DIAGNOSIS — S8001XA Contusion of right knee, initial encounter: Secondary | ICD-10-CM | POA: Diagnosis not present

## 2021-09-18 DIAGNOSIS — S59902A Unspecified injury of left elbow, initial encounter: Secondary | ICD-10-CM | POA: Diagnosis present

## 2021-09-18 MED ORDER — TETANUS-DIPHTH-ACELL PERTUSSIS 5-2.5-18.5 LF-MCG/0.5 IM SUSY
0.5000 mL | PREFILLED_SYRINGE | Freq: Once | INTRAMUSCULAR | Status: AC
  Administered 2021-09-18: 0.5 mL via INTRAMUSCULAR
  Filled 2021-09-18: qty 0.5

## 2021-09-18 NOTE — ED Provider Notes (Signed)
[facility] EMERGENCY DEPARTMENT Provider Note   CSN: [id number] Arrival date & time: 09/18/21  1854     History  Chief Complaint  Patient presents with   Fall   Weakness    Brent Mase. is a 76 y.o. male.  Patient is a 76 year old male who presents after a fall.  He states he was trying to bring groceries in and slipped on some wet grass and fell.  He did not hit his head.  No neck or back pain.  He has a laceration to his left elbow.  He also says he bruised his right knee.  He has chronic issues with his right knee.  He denies any other injuries from the fall.  He is not sure when his last tetanus shot was.  He says he feels at baseline.  He was not feeling weak or dizzy.  No chest pain or shortness of breath.  No symptoms that led to the fall.       Home Medications Prior to Admission medications   Medication Sig Start Date End Date Taking? Authorizing Provider  amLODipine (NORVASC) 10 MG tablet Take 10 mg by mouth daily. 01/19/16   [provider]  aspirin EC 81 MG tablet Take 81 mg by mouth daily. Swallow whole.    [provider]  Cinnamon 500 MG capsule Take 500 mg by mouth daily.    [provider]  clopidogrel (PLAVIX) 75 MG tablet Take 75 mg by mouth daily. 01/09/21   [provider]  diphenhydramine-acetaminophen (TYLENOL PM) 25-500 MG TABS tablet Take 2-3 tablets by mouth at bedtime as needed (sleep).    [provider]  doxycycline (VIBRA-TABS) 100 MG tablet Take 100 mg by mouth daily as needed (roscea outbreak). 01/19/16   [provider]  folic acid (FOLVITE) 1 MG tablet Take 1 mg by mouth daily.    [provider]  loperamide (IMODIUM) 2 MG capsule Take 2 mg by mouth as needed for diarrhea or loose stools.    [provider]  losartan (COZAAR) 100 MG tablet Take 100 mg by mouth daily. 04/20/16   [provider]  memantine (NAMENDA) 5 MG tablet Take 5 mg by mouth See  admin instructions. Qd x 3 weeks, then 1 bid    [provider]  metoprolol succinate (TOPROL-XL) 50 MG 24 hr tablet Take 1 tablet (50 mg total) by mouth at bedtime. Take with or immediately following a meal. 06/29/21 07/29/21  Demaio, Alexa, MD  Multiple Vitamin (MULTI-VITAMINS) TABS Take 1 tablet by mouth daily.    [provider]  naproxen (NAPROSYN) 500 MG tablet Take 500 mg by mouth 2 (two) times daily as needed for moderate pain. 12/03/15   [provider]  Omega 3 1000 MG CAPS Take 2,000 mg by mouth daily.    [provider]  Jonetta Speak LANCETS 07M MISC USE ONE LANCET DAILY TO TEST BLOOD GLUCOSE 05/16/16   [provider]  ONETOUCH VERIO test strip USE ONE STRIP DAILY TO TEST BLOOD GLUCOSE. 05/15/16   [provider]  potassium chloride SA (KLOR-CON) 20 MEQ tablet Take 20 mEq by mouth 2 (two) times daily.    [provider]  predniSONE (DELTASONE) 5 MG tablet Take 10 mg by mouth daily. 05/30/21   [provider]  Probiotic Product (ALIGN PO) Take 1 tablet by mouth every evening.    [provider]  rosuvastatin (CRESTOR) 20 MG tablet Take 20 mg by  mouth at bedtime.    [provider]  spironolactone (ALDACTONE) 25 MG tablet Take 12.5 mg by mouth daily. 05/12/21   [provider]  tiZANidine (ZANAFLEX) 2 MG tablet Take 2 mg by mouth every 6 (six) hours as needed for muscle spasms. 05/25/21   [provider]  traMADol (ULTRAM) 50 MG tablet Take 50 mg by mouth every 6 (six) hours as needed for moderate pain. 05/28/21   [provider]  TURMERIC PO Take 1 tablet by mouth daily.    [provider]  vitamin B-12 (CYANOCOBALAMIN) 1000 MCG tablet Take 1,000 mcg by mouth daily.    [provider]  vitamin C (ASCORBIC ACID) 250 MG tablet Take 250 mg by mouth daily.    [provider]  zinc gluconate 50 MG tablet Take 50 mg by mouth daily.    [provider]       Allergies    Donepezil    Review of Systems   Review of Systems  Constitutional:  Negative for fatigue and fever.  HENT:  Negative for facial swelling.   Respiratory:  Negative for chest tightness and shortness of breath.   Cardiovascular:  Negative for chest pain.  Gastrointestinal:  Negative for abdominal pain, nausea and vomiting.  Musculoskeletal:  Negative for arthralgias, back pain and neck pain.  Skin:  Positive for wound.  Neurological:  Negative for dizziness, light-headedness and headaches.    Physical Exam Updated Vital Signs BP (!) 151/109 (BP Location: Right Arm)   Pulse 68   Temp 98.7 F (37.1 C) (Oral)   Resp 17   Ht '5\' 10"'$  (1.778 m)   Wt 82.6 kg   SpO2 98%   BMI 26.11 kg/m  Physical Exam Constitutional:      Appearance: He is well-developed.  HENT:     Head: Normocephalic and atraumatic.  Eyes:     Pupils: Pupils are equal, round, and reactive to light.  Neck:     Comments: No pain along the cervical, thoracic or lumbosacral spine Cardiovascular:     Rate and Rhythm: Normal rate and regular rhythm.     Heart sounds: Normal heart sounds.  Pulmonary:     Effort: Pulmonary effort is normal. No respiratory distress.     Breath sounds: Normal breath sounds. No wheezing or rales.  Chest:     Chest wall: No tenderness.  Abdominal:     General: Bowel sounds are normal.     Palpations: Abdomen is soft.     Tenderness: There is no abdominal tenderness. There is no guarding or rebound.  Musculoskeletal:        General: Normal range of motion.     Cervical back: Normal range of motion and neck supple.     Comments: There is a small skin tear overlying the left elbow.  No bleeding.  No underlying bony tenderness.  There is a small amount of redness to the anterior surface of the right knee.  There is no underlying bony tenderness.  No pain on palpation or range of motion.  No other pain on palpation or range of motion of his extremities including the  hips.  Lymphadenopathy:     Cervical: No cervical adenopathy.  Skin:    General: Skin is warm and dry.     Findings: No rash.  Neurological:     General: No focal deficit present.     Mental Status: He is alert and oriented to person, place, and time.  ED Results / Procedures / Treatments   Labs (all labs ordered are listed, but only abnormal results are displayed) Labs Reviewed - No data to display  EKG EKG Interpretation  Date/Time:  Friday September 18 2021 19:24:07 EDT Ventricular Rate:  72 PR Interval:  132 QRS Duration: 92 QT Interval:  430 QTC Calculation: 470 R Axis:   -8 Text Interpretation: Normal sinus rhythm Nonspecific ST and T wave abnormality Prolonged QT Abnormal ECG When compared with ECG of 21-Jun-2021 10:38, PREVIOUS ECG IS PRESENT since last tracing no significant change Confirmed by Malvin Johns [phone]) on 09/18/2021 9:29:39 PM  Radiology No results found.  Procedures Procedures    Medications Ordered in ED Medications  Tdap (BOOSTRIX) injection 0.5 mL (has no administration in time range)    ED Course/ Medical Decision Making/ A&P                           Medical Decision Making Risk Prescription drug management.   Patient presents after mechanical fall.  He has a small skin tear to his left elbow.  No bony tenderness to his extremities.  He is on Plavix but he said he did not have any head injury.  No neck or back pain.  No symptoms preceding the fall.  It seems like it was just a mechanical fall.  He feels like he is at his normal baseline functioning status now.  I do not see any need for imaging currently.  His tetanus shot was updated.  His wounds were cleaned and dressed.  He was encouraged to follow-up with his primary care provider if he has any other concerns.  Return precautions were given.  Final Clinical Impression(s) / ED Diagnoses Final diagnoses:  Fall, initial encounter  Contusion of right knee, initial encounter  Skin tear  of left elbow without complication, initial encounter    Rx / DC Orders ED Discharge Orders     None         Malvin Johns, MD 09/18/21 2157

## 2021-09-18 NOTE — ED Triage Notes (Signed)
Patient states he had a trip and fall while trying to bring in groceries. Patient hit his head and now feels weak all over. Patient states "I always feel weak after I fall"  Per patient, he falls often.  Patient states he didn't hit his head. Left arm laceration noted. Bleeding is controlled at this time.   M&IT9

## 2021-09-27 IMAGING — MR MR HEAD W/O CM
7 of 11 series · 25 of 48 positions shown · non-contrast
Comparison: None.

CLINICAL DATA: Mental status change

EXAM:
MRI HEAD WITHOUT CONTRAST
TECHNIQUE: Multiplanar, multiecho pulse sequences of the brain and surrounding
structures were obtained without intravenous contrast.

[Series 2: DWI · axial · 3.0mm · 0.94mm/px · z∈[-44,+101]mm · 7 of 100 slices shown (1 of 2)]
[im 1/100]
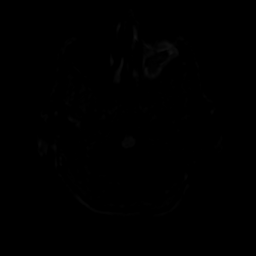
[im 17/100]
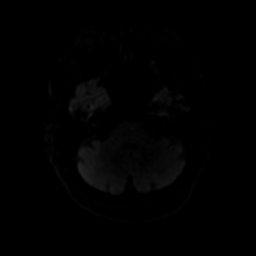
[im 34/100]
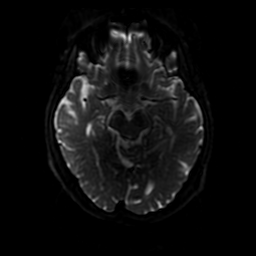
[im 50/100]
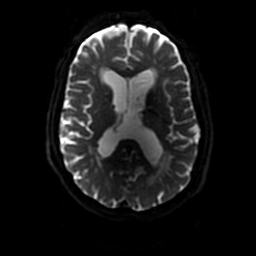
[im 67/100]
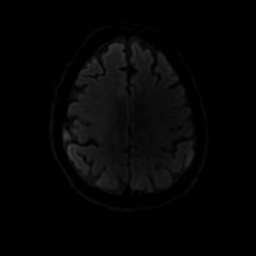
[im 83/100]
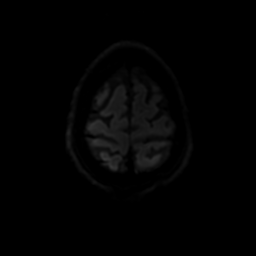
[im 100/100]
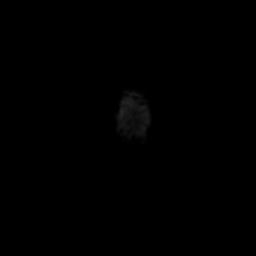

[Series 3: DWI · coronal · 4.0mm · 0.94mm/px · 6 of 76 slices shown (2 of 2)]
[im 1/76]
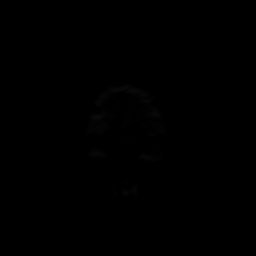
[im 16/76]
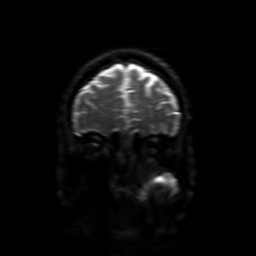
[im 31/76]
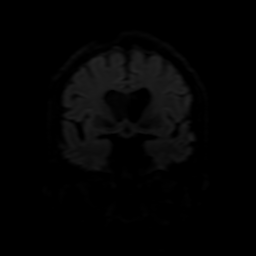
[im 46/76]
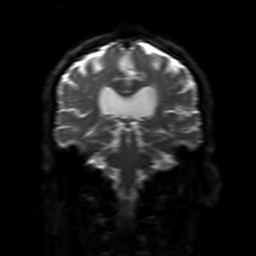
[im 61/76]
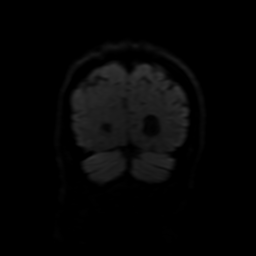
[im 76/76]
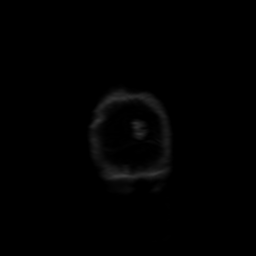

[Series 4: FLAIR · sagittal · 5.0mm · 0.23mm/px · 2 of 23 slices shown (1 of 2)]
[im 1/23]
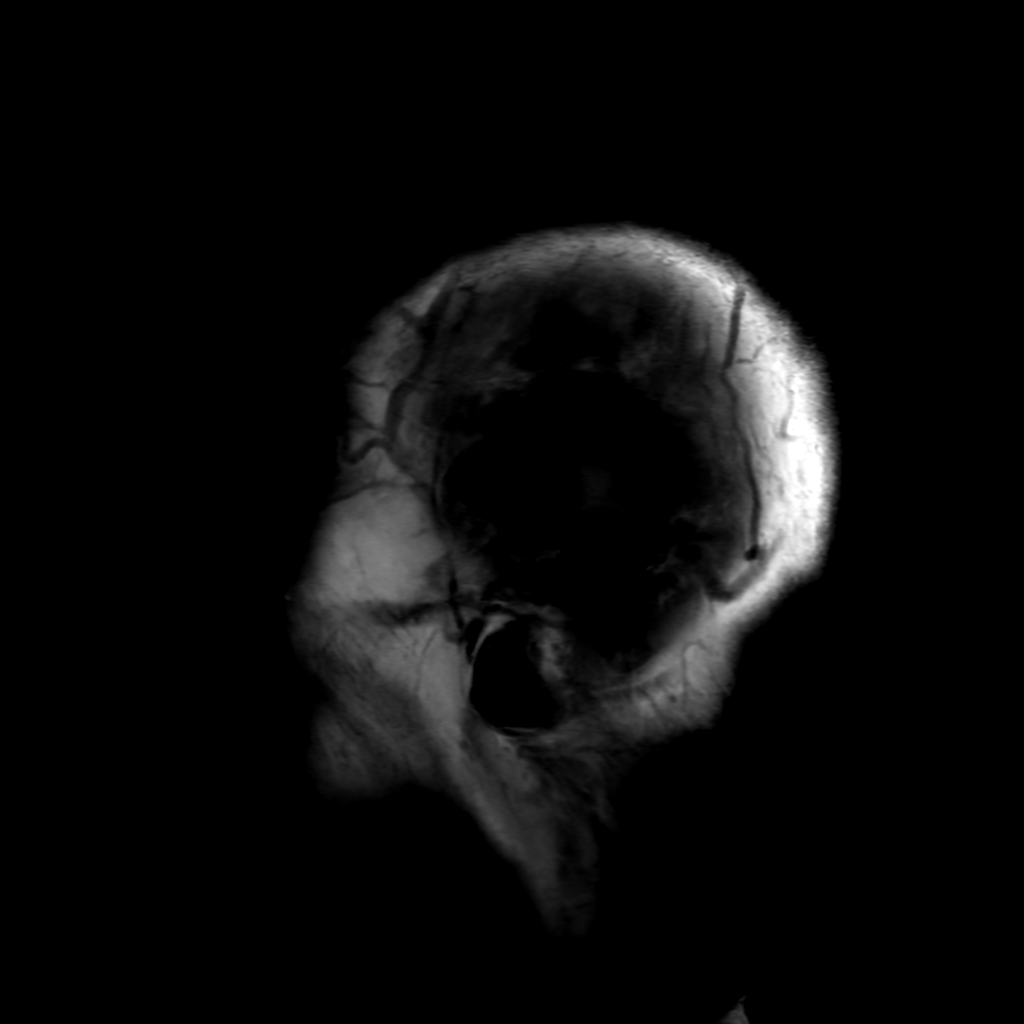
[im 23/23]
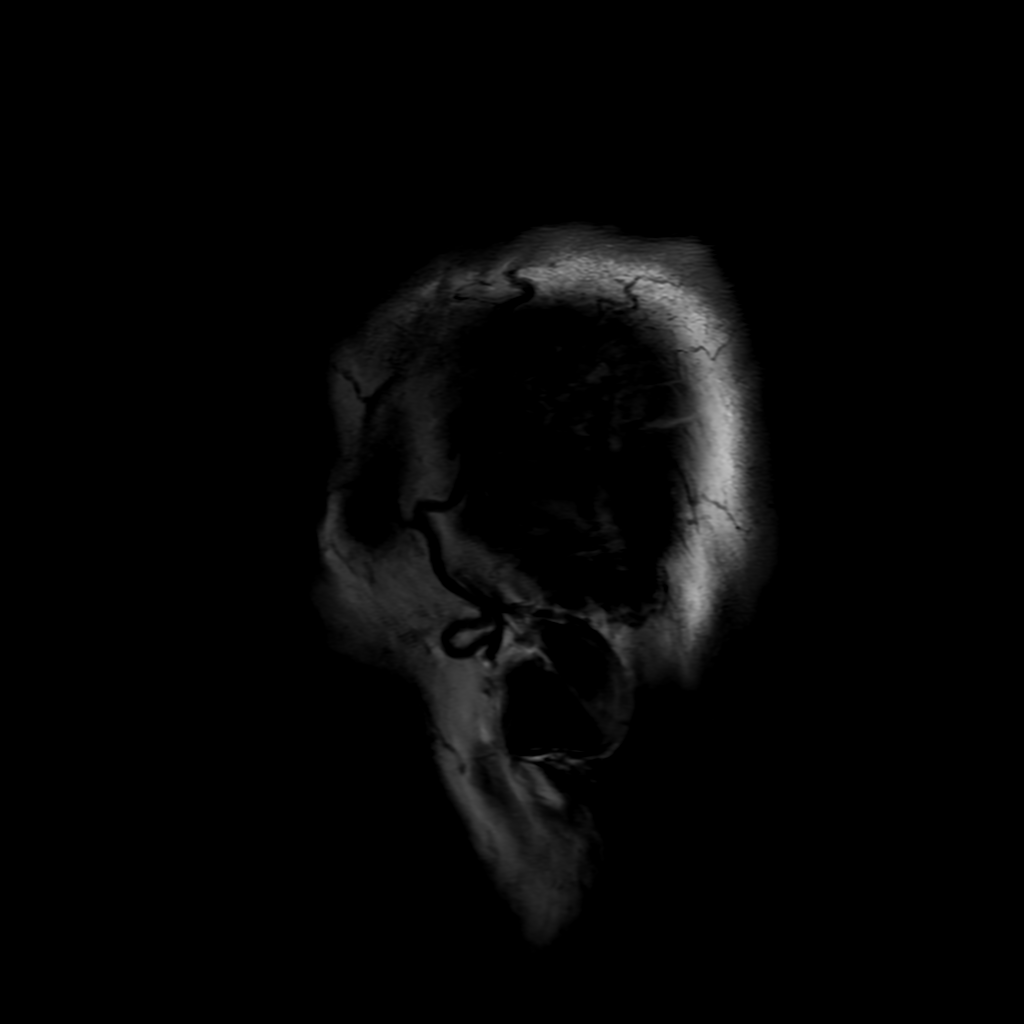

[Series 5: T2 · axial · 5.0mm · 0.23mm/px · 1 of 26 slices shown]
[im 1/26]
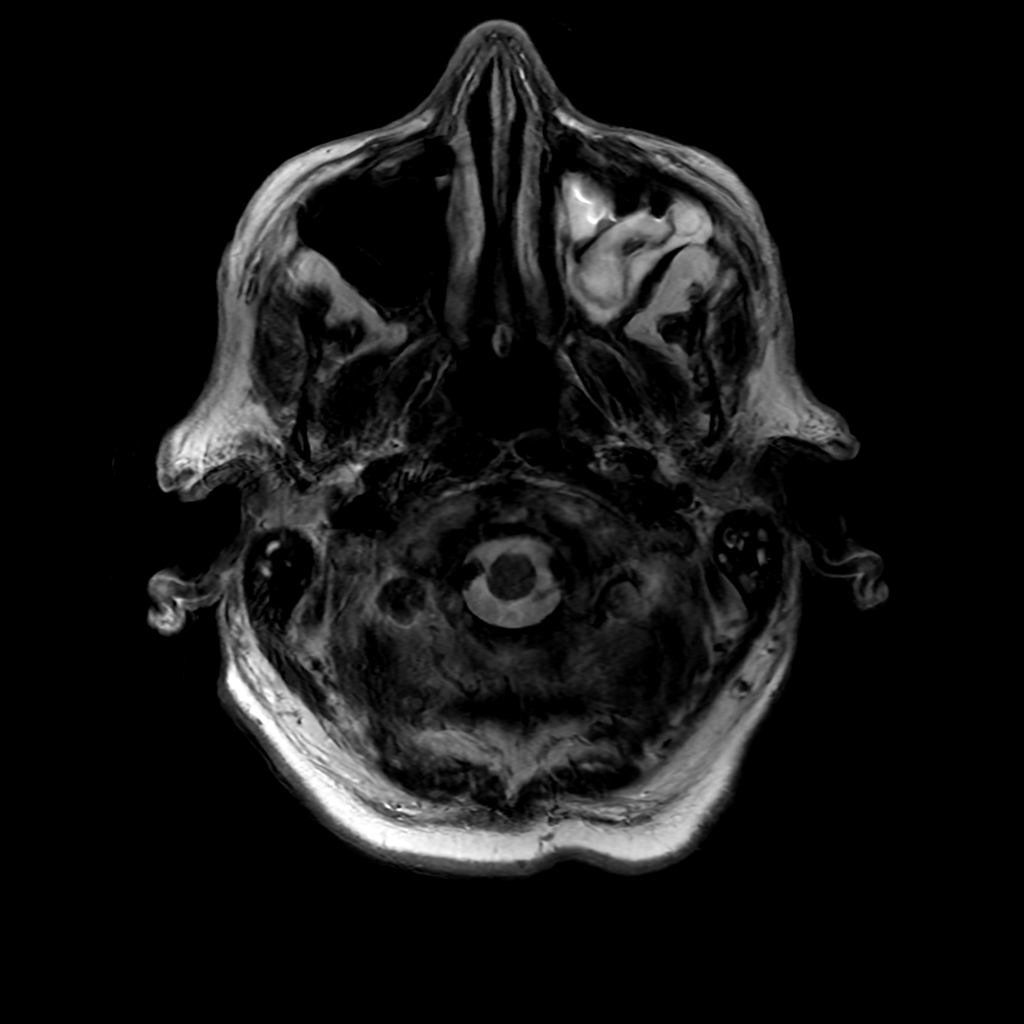

[Series 6: FLAIR · axial · 3.0mm · 0.45mm/px · z∈[-50,+97]mm · 2 of 26 slices shown (2 of 2)]
[im 1/26]
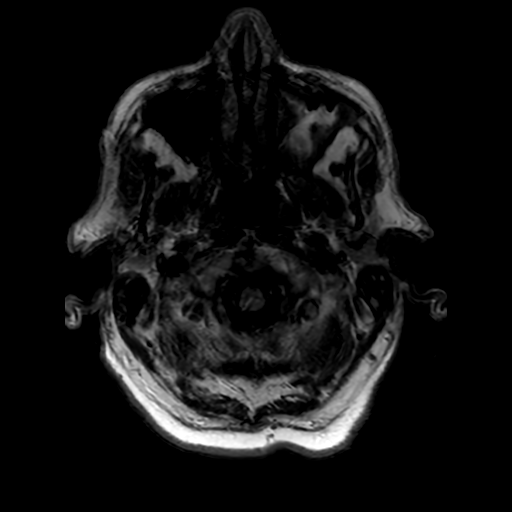
[im 26/26]
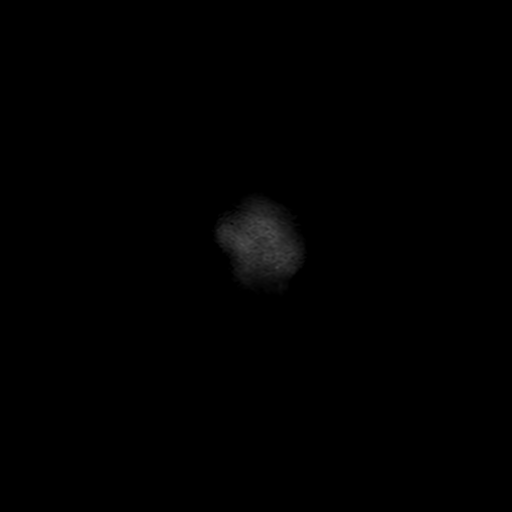

[Series 250: ADC · axial · 3.0mm · 0.94mm/px · z∈[-44,+101]mm · 4 of 50 slices shown (1 of 2)]
[im 1/50]
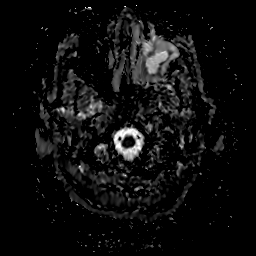
[im 17/50]
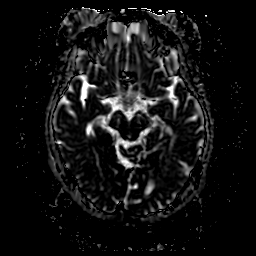
[im 33/50]
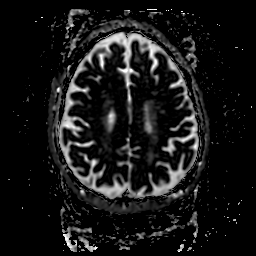
[im 50/50]
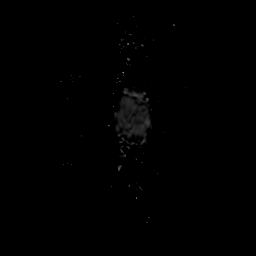

[Series 350: ADC · coronal · 4.0mm · 0.94mm/px · 3 of 38 slices shown (2 of 2)]
[im 1/38]
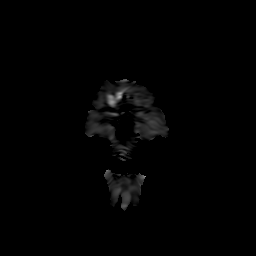
[im 19/38]
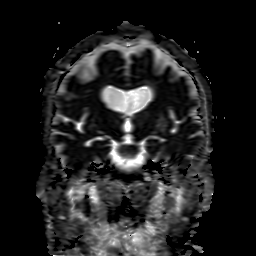
[im 38/38]
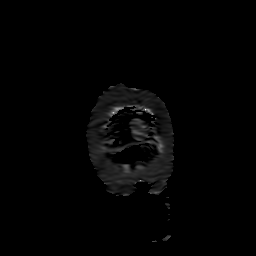

[25 of 48 positions shown; findings below may reference images not displayed]

FINDINGS: Brain: Generalized atrophy. Negative for hydrocephalus. Mild to
moderate white matter changes with periventricular and deep white
matter hyperintensities bilaterally. Chronic microhemorrhage left
thalamus. Negative for mass

Negative for acute infarct.  No areas of restricted diffusion.

Vascular: Normal arterial flow voids.

Skull and upper cervical spine: Negative

Sinuses/Orbits: Mucosal edema in the paranasal sinuses most notably
in the left maxillary sinus. Negative orbit

Other: None
IMPRESSION: No acute infarct

Atrophy and chronic microvascular ischemia. Chronic microhemorrhage
left thalamus.

## 2021-10-14 ENCOUNTER — Other Ambulatory Visit (HOSPITAL_BASED_OUTPATIENT_CLINIC_OR_DEPARTMENT_OTHER): Payer: Self-pay | Admitting: Orthopaedic Surgery

## 2021-10-14 ENCOUNTER — Ambulatory Visit (INDEPENDENT_AMBULATORY_CARE_PROVIDER_SITE_OTHER): Payer: Medicare Other

## 2021-10-14 DIAGNOSIS — M79661 Pain in right lower leg: Secondary | ICD-10-CM

## 2021-10-15 ENCOUNTER — Telehealth (HOSPITAL_BASED_OUTPATIENT_CLINIC_OR_DEPARTMENT_OTHER): Payer: Self-pay | Admitting: Cardiovascular Disease

## 2021-10-15 NOTE — Telephone Encounter (Signed)
Wife called requesting Vascular test results be sent to PCP, [place], [place], [place]  fax#: [phone].  Spoke with Harriett Rush in Vascular who will fax results to PCP.

## 2022-05-03 NOTE — Progress Notes (Signed)
CARDIOLOGY CONSULT NOTE       Patient ID: Brent Griffin. MRN: [id number] DOB/AGE: November 28, 1945 77 y.o.  Admit date: (Not on file) Referring Physician: Posraza Primary Physician: Brent Bellows, PA-C Primary Cardiologist: New Reason for Consultation: CHF/CAD    HPI:  77 y.o. referred by [name] for CHF/CAD.  He was admitted at [facility] for fall December. Smoker Abnormal CT with 6 mm nodule ? Volume overload Rx with lasix. CXR with CE small effusions CT suggested multi vessel coronary calcium He had a left rib fracture from fall. Only echo report is not complete Mentions EF 30-50% with dilated RV and MR and LAE this appears to be POC in ER done 03/14/22 Full echo done 08/03/21 showed EF 30-35% with inferior wall motion abnormality and trace MR  Cath done 01/09/21 showed 90% LCX lesion and 50% mid RCA had Xience stent placed 2.75 x 18 mm post dilated to 3.75 mm.  LVEDP normal 11 mmHg Had been seen by Dr Atilano Median in [place] with no NSVT Mentions referral to EP for AICD   Smoker. 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  No chest pain palpitations or syncope Retired from [employer] to go to [employer] with wife of 25 years   Discussed diagnosis of ischemic DCM. Discussed EF cutoffs for AICD as primary prevention Discussed optimizing medial Rx with Jardiance and possibly changing Cozaar to [facility] pending cost and EF evaluation  Discussed best way to quantify EF and scar burden with cardiac MRI as opposed to TTE He is not claustrophobic and kidneys have been ok   ECG today SR rate 60 PAC bigeminal pattern anterolateral T Wave inversions QT 528   ROS All other systems reviewed and negative except as noted  above  Past Medical History:  Diagnosis Date   Alzheimer disease ([name])    Arthritis    thumb right hand   Ascending aorta dilatation (HCC)    CAD (coronary artery disease)    Cancer ([place]) 2020   Prostrate- tx Radiation   Diabetes mellitus    type II, not taking medications - 03/31/20   DM (diabetes mellitus) ([name])    DVT (deep venous thrombosis) (HCC)    ED (erectile dysfunction)    Hammer toe    History of kidney stones    x 3   Hyperlipidemia    Hypertension    Hypokalemia    Insomnia    Ischemic dilated cardiomyopathy (HCC)    Knee effusion    Lipid disorder    Malignant neoplasm of prostate ([name])    Memory loss    Metatarsalgia    left foot   Metatarsalgia, left foot    Mild renal insufficiency    Mobitz type 1 second degree AV block    Muscle spasm of back    PAC (premature atrial contraction)    Pseudogout    Pulmonary nodule    Recurrent falls    Rosacea    Scoliosis    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    Tobacco use disorder    Trimalleolar fracture    Unsteady gait    Urinary hesitancy     Family History  Problem  Relation Age of Onset   Colon cancer Other 82   Prostate cancer Father    Diabetes Mellitus II Father    Liver disease Neg Hx    Esophageal cancer Neg Hx    Pancreatic cancer Neg Hx    Stomach cancer Neg Hx     Social History   Socioeconomic History   Marital status: Married    Spouse name: Not on file   Number of children: Not on file   Years of education: Not on file   Highest education level: Not on file  Occupational History   Not on file  Tobacco Use   Smoking status: Some Days    Years: 30.00    Types: Cigarettes   Smokeless tobacco: Never   Tobacco comments:    1 a week  Vaping Use   Vaping Use: Never used  Substance and Sexual Activity   Alcohol use: No   Drug use: No   Sexual activity: Not Currently  Other Topics Concern   Not on file  Social History Narrative   Not on file   Social  Determinants of Health   Financial Resource Strain: Not on file  Food Insecurity: Not on file  Transportation Needs: Not on file  Physical Activity: Not on file  Stress: Not on file  Social Connections: Not on file  Intimate Partner Violence: Not on file    Past Surgical History:  Procedure Laterality Date   COLONOSCOPY     FOOT FRACTURE SURGERY Bilateral    FOOT SURGERY Left 12/2018   LITHOTRIPSY     MOUTH SURGERY  1990   ORIF ANKLE FRACTURE Right 06/25/2021   Procedure: OPEN TREATMENT OF RIGHT SUBACUTE TRIMALLEOLAR ANKLE FRACTURE;  Surgeon: Erle Crocker, MD;  Location: [place];  Service: Orthopedics;  Laterality: Right;  LENGHT OF SURGERY: 90 MINUTES   ORIF ELBOW FRACTURE Left 04/01/2020   Procedure: OPEN REDUCTION INTERNAL FIXATION (ORIF) SUPRACONDYLAR ELBOW  FRACTURE, OLECRANON OSTEOTOMY AND BURSECTOMY;  Surgeon: Roseanne Kaufman, MD;  Location: [place];  Service: Orthopedics;  Laterality: Left;   ROTATOR CUFF REPAIR Right 2018   SYNDESMOSIS REPAIR Right 06/25/2021   Procedure: SYNDESMOSIS;  Surgeon: Erle Crocker, MD;  Location: [place];  Service: Orthopedics;  Laterality: Right;   ULNAR NERVE TRANSPOSITION Left 04/01/2020   Procedure: ULNAR NERVE RELEASE AND TRANSPOSITION;  Surgeon: Roseanne Kaufman, MD;  Location: [place];  Service: Orthopedics;  Laterality: Left;   VASECTOMY  1984      Current Outpatient Medications:    amLODipine (NORVASC) 10 MG tablet, Take 10 mg by mouth daily., Disp: , Rfl:    aspirin EC 81 MG tablet, Take 81 mg by mouth daily. Swallow whole., Disp: , Rfl:    Cinnamon 500 MG capsule, Take 500 mg by mouth daily., Disp: , Rfl:    diphenhydramine-acetaminophen (TYLENOL PM) 25-500 MG TABS tablet, Take 2-3 tablets by mouth at bedtime as needed (sleep)., Disp: , Rfl:    doxycycline (VIBRA-TABS) 100 MG tablet, Take 100 mg by mouth daily as needed (roscea outbreak)., Disp: , Rfl:    folic acid (FOLVITE) 1 MG tablet, Take 1 mg by mouth daily., Disp: , Rfl:     furosemide (LASIX) 40 MG tablet, Take 1 tablet by mouth daily., Disp: , Rfl:    loperamide (IMODIUM) 2 MG capsule, Take 2 mg by mouth as needed for diarrhea or loose stools., Disp: , Rfl:    losartan (COZAAR) 100 MG tablet, Take 100 mg by mouth daily.,  Disp: , Rfl:    metoprolol succinate (TOPROL-XL) 25 MG 24 hr tablet, Take 1 tablet by mouth daily., Disp: , Rfl:    Multiple Vitamin (MULTI-VITAMINS) TABS, Take 1 tablet by mouth daily., Disp: , Rfl:    naproxen (NAPROSYN) 500 MG tablet, Take 500 mg by mouth 2 (two) times daily as needed for moderate pain., Disp: , Rfl:    Omega 3 1000 MG CAPS, Take 2,000 mg by mouth daily., Disp: , Rfl:    ONETOUCH DELICA LANCETS 10G MISC, USE ONE LANCET DAILY TO TEST BLOOD GLUCOSE, Disp: , Rfl: 3   ONETOUCH VERIO test strip, USE ONE STRIP DAILY TO TEST BLOOD GLUCOSE., Disp: , Rfl: 3   potassium chloride SA (KLOR-CON) 20 MEQ tablet, Take 20 mEq by mouth 2 (two) times daily., Disp: , Rfl:    predniSONE (DELTASONE) 5 MG tablet, Take 10 mg by mouth daily., Disp: , Rfl:    Probiotic Product (ALIGN PO), Take 1 tablet by mouth every evening., Disp: , Rfl:    rosuvastatin (CRESTOR) 20 MG tablet, Take 20 mg by mouth at bedtime., Disp: , Rfl:    spironolactone (ALDACTONE) 25 MG tablet, Take 12.5 mg by mouth daily., Disp: , Rfl:    tiZANidine (ZANAFLEX) 2 MG tablet, Take 2 mg by mouth every 6 (six) hours as needed for muscle spasms., Disp: , Rfl:    traMADol (ULTRAM) 50 MG tablet, Take 50 mg by mouth every 6 (six) hours as needed for moderate pain., Disp: , Rfl:    TURMERIC PO, Take 1 tablet by mouth daily., Disp: , Rfl:    vitamin B-12 (CYANOCOBALAMIN) 1000 MCG tablet, Take 1,000 mcg by mouth daily., Disp: , Rfl:    vitamin C (ASCORBIC ACID) 250 MG tablet, Take 250 mg by mouth daily., Disp: , Rfl:    zinc gluconate 50 MG tablet, Take 50 mg by mouth daily., Disp: , Rfl:    clopidogrel (PLAVIX) 75 MG tablet, Take 75 mg by mouth daily. (Patient not taking: Reported on  05/10/2022), Disp: , Rfl:    memantine (NAMENDA) 5 MG tablet, Take 5 mg by mouth See admin instructions. Qd x 3 weeks, then 1 bid (Patient not taking: Reported on 05/10/2022), Disp: , Rfl:     Physical Exam: Blood pressure 114/70, pulse (!) 36, height '5\' 10"'$  (1.778 m), weight 178 lb 9.6 oz (81 kg), SpO2 97 %.    Affect appropriate Chronically ill male  HEENT: normal Neck supple with no adenopathy JVP normal no bruits no thyromegaly Lungs clear with no wheezing and good diaphragmatic motion Heart:  S1/S2 no murmur, no rub, gallop or click PMI normal Abdomen: benighn, BS positve, no tenderness, no AAA no bruit.  No HSM or HJR Distal pulses intact with no bruits No edema Neuro non-focal Skin warm and dry No muscular weakness   Labs:   Lab Results  Component Value Date   WBC 7.8 06/26/2021   HGB 13.1 06/26/2021   HCT 39.8 06/26/2021   MCV 89.4 06/26/2021   PLT 191 06/26/2021   No results for input(s): "NA", "K", "CL", "CO2", "BUN", "CREATININE", "CALCIUM", "PROT", "BILITOT", "ALKPHOS", "ALT", "AST", "GLUCOSE" in the last 168 hours.  Invalid input(s): "LABALBU" Lab Results  Component Value Date   YIRSWNI 627 (H) 04/19/2021   No results found for: "CHOL" No results found for: "HDL" No results found for: "[place]" No results found for: "TRIG" No results found for: "CHOLHDL" No results found for: "LDLDIRECT"    Radiology: No results found.  EKG: 09/23/21 SR rate 73 nonspecific ST changes   ASSESSMENT AND PLAN:   CAD:  LCX stent 01/10/71 with 50% residual RCA. Continue beta blocker ASA and statin and cozaar Add Jardiance 10 mg daily He will get back to [place] regarding cost of Entresto Ischemic DCM:  EF not clear from notes POCUS recent admission December indicates 30-50% Dr Luisa Dago note says 25-30% but TTE from May 2023 says 30-35% . Discussed having cardiac MRI to quantitate and assess degree of scar in LCX territory   On ARB but not Entresto Add Jardiance  HLD:  continue  statin labs with primary  Smoking:  needs f/u lung cancer CT for 6 mm nodule per primary  Dementia:  continue dementia with age may have some bearing on primary prevention AICD QT:  prolonged check labs today will have EP address as well after MRI   Cardiac MRI BMET/Hct/BNP Jardiance 10 mg daily  Entresto ? Cost Refer to [facility] EP for long QT ischemic DCM and possible need for AICD   F/U 3 months possible referral to EP post MRI if EF < 35%   Signed: Jenkins Rouge 05/10/2022, 12:59 PM

## 2022-05-10 ENCOUNTER — Encounter: Payer: Self-pay | Admitting: Cardiovascular Disease

## 2022-05-10 ENCOUNTER — Ambulatory Visit: Payer: Medicare Other | Attending: Cardiovascular Disease | Admitting: Cardiovascular Disease

## 2022-05-10 VITALS — BP 114/70 | HR 36 | Ht 70.0 in | Wt 178.6 lb

## 2022-05-10 DIAGNOSIS — F17200 Nicotine dependence, unspecified, uncomplicated: Secondary | ICD-10-CM

## 2022-05-10 DIAGNOSIS — F0150 Vascular dementia without behavioral disturbance: Secondary | ICD-10-CM | POA: Diagnosis present

## 2022-05-10 DIAGNOSIS — I255 Ischemic cardiomyopathy: Secondary | ICD-10-CM | POA: Diagnosis present

## 2022-05-10 DIAGNOSIS — E782 Mixed hyperlipidemia: Secondary | ICD-10-CM | POA: Diagnosis present

## 2022-05-10 DIAGNOSIS — I493 Ventricular premature depolarization: Secondary | ICD-10-CM | POA: Diagnosis present

## 2022-05-10 DIAGNOSIS — I2510 Atherosclerotic heart disease of native coronary artery without angina pectoris: Secondary | ICD-10-CM | POA: Diagnosis present

## 2022-05-10 MED ORDER — EMPAGLIFLOZIN 10 MG PO TABS: 10.0000 mg | ORAL_TABLET | Freq: Every day | ORAL | 3 refills | Status: DC

## 2022-05-10 NOTE — Patient Instructions (Addendum)
Medication Instructions:  Your physician has recommended you make the following change in your medication:  1-START Jardiance 10 mg by mouth daily.      *If you need a refill on your cardiac medications before your next appointment, please call your pharmacy*  Lab Work: Your physician recommends that you have lab work today- BMET, BNP, Hematocrit, magnesium   If you have labs (blood work) drawn today and your tests are completely normal, you will receive your results only by: [place] (if you have [place]) OR A paper copy in the mail If you have any lab test that is abnormal or we need to change your treatment, we will call you to review the results.  Testing/Procedures: Your physician has requested that you have a cardiac MRI. Cardiac MRI uses a computer to create images of your heart as its beating, producing both still and moving pictures of your heart and major blood vessels. For further information please visit [email or web address] Please follow the instruction sheet given to you today for more information.  Follow-Up: At [facility], you and your health needs are our priority.  As part of our continuing mission to provide you with exceptional heart care, we have created designated Provider Care Teams.  These Care Teams include your primary Cardiologist (physician) and Advanced Practice Providers (APPs -  Physician Assistants and Nurse Practitioners) who all work together to provide you with the care you need, when you need it.  We recommend signing up for the patient portal called "MyChart".  Sign up information is provided on this After Visit Summary.  MyChart is used to connect with patients for Virtual Visits (Telemedicine).  Patients are able to view lab/test results, encounter notes, upcoming appointments, etc.  Non-urgent messages can be sent to your provider as well.   To learn more about what you can do with MyChart, go to [email or web address].    Your next  appointment:   3  month(s)  Provider:   Jenkins Rouge, MD

## 2022-05-11 ENCOUNTER — Telehealth: Payer: Self-pay

## 2022-05-11 DIAGNOSIS — I255 Ischemic cardiomyopathy: Secondary | ICD-10-CM

## 2022-05-11 DIAGNOSIS — Z79899 Other long term (current) drug therapy: Secondary | ICD-10-CM

## 2022-05-11 DIAGNOSIS — R0602 Shortness of breath: Secondary | ICD-10-CM

## 2022-05-11 DIAGNOSIS — R7989 Other specified abnormal findings of blood chemistry: Secondary | ICD-10-CM

## 2022-05-11 LAB — MAGNESIUM: Magnesium: 2.4 mg/dL — ABNORMAL HIGH (ref 1.6–2.3)

## 2022-05-11 LAB — BASIC METABOLIC PANEL
BUN/Creatinine Ratio: 17 (ref 10–24)
BUN: 19 mg/dL (ref 8–27)
CO2: 22 mmol/L (ref 20–29)
Calcium: 9.3 mg/dL (ref 8.6–10.2)
Chloride: 104 mmol/L (ref 96–106)
Creatinine, Ser: 1.12 mg/dL (ref 0.76–1.27)
Glucose: 102 mg/dL — ABNORMAL HIGH (ref 70–99)
Potassium: 3.7 mmol/L (ref 3.5–5.2)
Sodium: 144 mmol/L (ref 134–144)
eGFR: 69 mL/min/{1.73_m2} (ref >59)

## 2022-05-11 LAB — HEMATOCRIT: Hematocrit: 47 % (ref 37.5–51.0)

## 2022-05-11 LAB — PRO B NATRIURETIC PEPTIDE: NT-Pro BNP: 3928 pg/mL — ABNORMAL HIGH (ref 0–486)

## 2022-05-11 MED ORDER — SPIRONOLACTONE 25 MG PO TABS: 25.0000 mg | ORAL_TABLET | Freq: Every day | ORAL | 3 refills | Status: DC

## 2022-05-11 NOTE — Telephone Encounter (Signed)
-----   Message from Josue Hector, MD sent at 05/11/2022  8:19 AM EST ----- BNP elevated increase aldactone to 25 mg daily repeat labs in 4 weeks

## 2022-05-11 NOTE — Telephone Encounter (Signed)
Called patient with results. Patient will increase his spirolactone to 25 mg by mouth daily and will come in on 06/08/22 for lab work.

## 2022-05-14 ENCOUNTER — Telehealth: Payer: Self-pay | Admitting: Cardiovascular Disease

## 2022-05-14 NOTE — Telephone Encounter (Signed)
Patient is calling to check on status of MRI results.

## 2022-05-14 NOTE — Telephone Encounter (Signed)
Called patient back about message. Patient did not know he had a MRI scheduled already in May. Gave patient time and date. Patient verbalized understanding.

## 2022-05-18 ENCOUNTER — Telehealth: Payer: Self-pay | Admitting: Cardiovascular Disease

## 2022-05-18 DIAGNOSIS — Z79899 Other long term (current) drug therapy: Secondary | ICD-10-CM

## 2022-05-18 DIAGNOSIS — I255 Ischemic cardiomyopathy: Secondary | ICD-10-CM

## 2022-05-18 MED ORDER — ENTRESTO 24-26 MG PO TABS: 1.0000 | ORAL_TABLET | Freq: Two times a day (BID) | ORAL | 11 refills | Status: DC

## 2022-05-18 NOTE — Telephone Encounter (Signed)
Called patient with Dr. Kyla Balzarine advisement. Patient stated he would start on entresto next week. 30 day free coupon included in prescription sent to patient's pharmacy. Patient will stop taking losartan and replace it with new medication. Ordered pharmacy referral to help titrate medication.

## 2022-05-18 NOTE — Telephone Encounter (Signed)
Pt c/o medication issue:  1. Name of Medication: Entresto  2. How are you currently taking this medication (dosage and times per day)?   3. Are you having a reaction (difficulty breathing--STAT)?   4. What is your medication issue? Patient called stating he was prescribed this medication last week, but the script was never sent. He needs a script sent to [facility] [place].

## 2022-05-18 NOTE — Telephone Encounter (Signed)
At office visit patient was going to check with insurance before we prescribed Enstresto. Patient is wanting to move forward and will not know for sure if [name] is covered without an order being sent to pharmacy. Will send message to Dr. Johnsie Cancel to clarify starting does at 24/26 mg .

## 2022-05-19 NOTE — Telephone Encounter (Signed)
Patient is following up. He states the pharmacy did not receive the coupon for Entresto.

## 2022-05-19 NOTE — Telephone Encounter (Signed)
Called [facility] they stated they would run coupon through. Patient already picked up 30 day supply that cost him over $100. Gave patient Entresto patient assistance number. Will send message to pre auth nurse to help and follow-up.

## 2022-05-20 NOTE — Telephone Encounter (Signed)
**Note De-Identified  Obfuscation** No answer so I left a message on the pts VM asking him to call Jeani Hawking back at Dr Kyla Balzarine office at [facility] at [phone] if he has concerns with the cost of Entresto.

## 2022-06-08 ENCOUNTER — Ambulatory Visit: Payer: Medicare Other | Attending: Cardiovascular Disease

## 2022-06-08 DIAGNOSIS — R7989 Other specified abnormal findings of blood chemistry: Secondary | ICD-10-CM

## 2022-06-08 DIAGNOSIS — I255 Ischemic cardiomyopathy: Secondary | ICD-10-CM

## 2022-06-08 DIAGNOSIS — Z79899 Other long term (current) drug therapy: Secondary | ICD-10-CM

## 2022-06-08 DIAGNOSIS — R0602 Shortness of breath: Secondary | ICD-10-CM

## 2022-06-14 NOTE — Progress Notes (Deleted)
  Electrophysiology Office Note:    Date:  06/14/2022   ID:  Brent Charon., DOB 1945-06-03, MRN [id number]  [facility] Cardiologist:  Jenkins Rouge, MD  [place] Electrophysiologist:  None   Referring MD: Josue Hector, MD   Chief Complaint: Heart failure  History of Present Illness:    Brent Veal. is a 77 y.o. male who I am seeing today for evaluation of heart failure at the request of Dr. Johnsie Cancel.  The patient last saw Dr. Johnsie Cancel May 10, 2022.  She was previously seen at [facility] for her heart failure diagnosis.  She has significant coronary artery disease with prior PCI.  A cardiac MRI was recommended at the last appointment.  Her cardiac MRI has not been completed.      Their past medical, social and family history was reveiwed.   ROS:   Please see the history of present illness.    All other systems reviewed and are negative.  EKGs/Labs/Other Studies Reviewed:    The following studies were reviewed today:   Physical Exam:    VS:  There were no vitals taken for this visit.    Wt Readings from Last 3 Encounters:  05/10/22 178 lb 9.6 oz (81 kg)  09/18/21 182 lb (82.6 kg)  06/23/21 174 lb 13.2 oz (79.3 kg)     GEN: *** Well nourished, well developed in no acute distress CARDIAC: ***RRR, no murmurs, rubs, gallops RESPIRATORY:  Clear to auscultation without rales, wheezing or rhonchi       ASSESSMENT AND PLAN:    No diagnosis found.  #Chronic systolic heart failure NYHA II. Warm and dry. EF last reported as 25-30% (02/2022 OSH echo)   ? Chronic steroids         Total time spent with patient today *** minutes. This includes reviewing records, evaluating the patient and coordinating care.     Signed, Hilton Cork. Quentin Ore, MD, [facility], [facility] 06/14/2022 9:06 PM    Electrophysiology [facility]

## 2022-06-15 ENCOUNTER — Institutional Professional Consult (permissible substitution): Payer: Medicare Other | Admitting: Cardiology

## 2022-06-15 DIAGNOSIS — I255 Ischemic cardiomyopathy: Secondary | ICD-10-CM

## 2022-06-15 DIAGNOSIS — I5020 Unspecified systolic (congestive) heart failure: Secondary | ICD-10-CM

## 2022-06-15 DIAGNOSIS — I2510 Atherosclerotic heart disease of native coronary artery without angina pectoris: Secondary | ICD-10-CM

## 2022-06-16 LAB — BASIC METABOLIC PANEL
BUN/Creatinine Ratio: 19 (ref 10–24)
BUN: 24 mg/dL (ref 8–27)
CO2: 17 mmol/L — ABNORMAL LOW (ref 20–29)
Calcium: 8.9 mg/dL (ref 8.6–10.2)
Chloride: 103 mmol/L (ref 96–106)
Creatinine, Ser: 1.25 mg/dL (ref 0.76–1.27)
Glucose: 133 mg/dL — ABNORMAL HIGH (ref 70–99)
Potassium: 3.6 mmol/L (ref 3.5–5.2)
Sodium: 142 mmol/L (ref 134–144)
eGFR: 60 mL/min/{1.73_m2} (ref >59)

## 2022-06-16 LAB — PRO B NATRIURETIC PEPTIDE: NT-Pro BNP: 1148 pg/mL — ABNORMAL HIGH (ref 0–486)

## 2022-06-24 ENCOUNTER — Telehealth: Payer: Self-pay | Admitting: *Deleted

## 2022-06-24 ENCOUNTER — Other Ambulatory Visit: Payer: Self-pay | Admitting: *Deleted

## 2022-06-24 ENCOUNTER — Encounter: Payer: Self-pay | Admitting: Gastroenterology

## 2022-06-24 ENCOUNTER — Ambulatory Visit (INDEPENDENT_AMBULATORY_CARE_PROVIDER_SITE_OTHER): Payer: Medicare Other | Admitting: Gastroenterology

## 2022-06-24 VITALS — BP 128/82 | HR 72 | Ht 69.0 in | Wt 176.5 lb

## 2022-06-24 DIAGNOSIS — Z7902 Long term (current) use of antithrombotics/antiplatelets: Secondary | ICD-10-CM | POA: Diagnosis not present

## 2022-06-24 DIAGNOSIS — R152 Fecal urgency: Secondary | ICD-10-CM

## 2022-06-24 DIAGNOSIS — R194 Change in bowel habit: Secondary | ICD-10-CM

## 2022-06-24 DIAGNOSIS — R197 Diarrhea, unspecified: Secondary | ICD-10-CM

## 2022-06-24 MED ORDER — NA SULFATE-K SULFATE-MG SULF 17.5-3.13-1.6 GM/177ML PO SOLN: 1.0000 | Freq: Once | ORAL | 0 refills | Status: AC

## 2022-06-24 NOTE — Telephone Encounter (Signed)
We changed colonoscopy to [facility] on the 5/6

## 2022-06-24 NOTE — Telephone Encounter (Signed)
[facility] Pre-operative Risk Assessment     Request for surgical clearance:     Endoscopy Procedure  What type of surgery is being performed?     colonoscopy  When is this surgery scheduled?     08/04/2022  What type of clearance is required ?   Pharmacy Plavix and Cardiac   Are there any medications that need to be held prior to surgery and how long? Plavix   Practice name and name of physician performing surgery?      [facility]  What is your office phone and fax number?      Phone- [phone]  [phone]  Anesthesia type (None, local, MAC, general) ?       MAC

## 2022-06-24 NOTE — Telephone Encounter (Signed)
We have to change patients procedure from 5/1 to 5/6 with [facility] due to patients low EF. Dr Jaci Lazier next available opening for [facility] is 5/6 per [place]. Will touch base with patient to make sure that date is ok then reschedule.  Called patient and informed him that he is rescheduled for [facility] due to low EF on 08/09/2022 at 9:15 am. Patient understands changes and was ok with that. Told him to call back with any questions. He will arrive at 7:45am to [facility]   Also discussed time changes of prep with patient   Brent Griffin

## 2022-06-24 NOTE — Patient Instructions (Addendum)
You have been scheduled for a colonoscopy. Please follow written instructions given to you at your visit today.  Please pick up your prep supplies at the pharmacy within the next 1-3 days. If you use inhalers (even only as needed), please bring them with you on the day of your procedure.   Take Benefiber 2 tbs in 8 ounces of juice or water daily  You will be contacted by our office prior to your procedure for directions on holding your Plavix and cardiac clearance.  If you do not hear from our office 1 week prior to your scheduled procedure, please call [phone] to discuss.   Due to recent changes in healthcare laws, you may see the results of your imaging and laboratory studies on MyChart before your provider has had a chance to review them.  We understand that in some cases there may be results that are confusing or concerning to you. Not all laboratory results come back in the same time frame and the provider may be waiting for multiple results in order to interpret others.  Please give [place] 48 hours in order for your provider to thoroughly review all the results before contacting the office for clarification of your results.    I appreciate the  opportunity to care for you  Thank You   Alonza Bogus, PA-C

## 2022-06-24 NOTE — Progress Notes (Signed)
Attending Physician's Attestation   I have reviewed the chart.   I agree with the Advanced Practitioner's note, impression, and recommendations with any updates as below. Certainly would wait for cardiac workup to be completed before any endoscopic procedures but agree with the continued utility of trying to bulk the stool in the interim.   Justice Britain, MD [facility] Advanced Endoscopy Office # [id number]

## 2022-06-24 NOTE — Progress Notes (Signed)
06/24/2022 Brent Griffin [id number] February 20, 1946   HISTORY OF PRESENT ILLNESS: This is a 77 year old male who is a patient Dr. Donneta Griffin.  He had been seen by me twice back in the spring 2022 for issues with episodic/intermittent diarrhea and loose stools with urgency.  He was using Imodium daily and align probiotic daily.  We discussed trying Benefiber.  We also discussed that he needed a colonoscopy as his last was not since 2013.  Colonoscopy July 2013 he was only found to have diverticulosis and internal hemorrhoids and was told to have a repeat in 10 years.  When I saw him in the spring 2022 he had needed elbow surgery and was is in a splint in regards to that.  After he recovered from all that he was going to return for colonoscopy.  Then he ended up with a foot surgery.  Then in October 2022 he had a cardiac catheterization had stents placed.  He is now on Plavix.  Looks like last known EF is 35 to 40%.  Then in March of last year he had another foot injury and required an extensive surgery with hardware.  Ended up at a rehab facility for 10 weeks.  Still undergoing rehab twice a week for that injury at this point a year later.  Still complains of the same issue with occasional diarrhea episodes maybe once a week or so.  On average has 1 bowel movement a day.  Still using Imodium daily and align probiotic daily.  He recalls our conversation about the Benefiber, but cannot recall if he took the medication, if it worked, [employer] research officer, government.  He is scheduled to have a cardiac MRI, which is not until May.  He follows with Dr. Johnsie Griffin.  Previous celiac labs normal/negative.   Past Medical History:  Diagnosis Date   Alzheimer disease ([place])    Arthritis    thumb right hand   Ascending aorta dilatation (HCC)    CAD (coronary artery disease)    Cancer ([place]) 2020   Prostrate- tx Radiation   Diabetes mellitus    type II, not taking medications - 03/31/20   DM (diabetes mellitus) ([place])    DVT (deep  venous thrombosis) (HCC)    ED (erectile dysfunction)    Hammer toe    History of kidney stones    x 3   Hyperlipidemia    Hypertension    Hypokalemia    Insomnia    Ischemic dilated cardiomyopathy (HCC)    Knee effusion    Lipid disorder    Malignant neoplasm of prostate ([place])    Memory loss    Metatarsalgia    left foot   Metatarsalgia, left foot    Mild renal insufficiency    Mobitz type 1 second degree AV block    Muscle spasm of back    PAC (premature atrial contraction)    Pseudogout    Pulmonary nodule    Recurrent falls    Rosacea    Scoliosis    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    Tobacco use disorder    Trimalleolar fracture    Unsteady gait    Urinary hesitancy    Past Surgical History:  Procedure Laterality Date   COLONOSCOPY     FOOT FRACTURE SURGERY Bilateral    FOOT SURGERY Left 12/2018   LITHOTRIPSY     MOUTH SURGERY  1990   ORIF ANKLE FRACTURE Right 06/25/2021   Procedure: OPEN TREATMENT OF  RIGHT SUBACUTE TRIMALLEOLAR ANKLE FRACTURE;  Surgeon: Brent Crocker, MD;  Location: [facility];  Service: Orthopedics;  Laterality: Right;  LENGHT OF SURGERY: 90 MINUTES   ORIF ELBOW FRACTURE Left 04/01/2020   Procedure: OPEN REDUCTION INTERNAL FIXATION (ORIF) SUPRACONDYLAR ELBOW  FRACTURE, OLECRANON OSTEOTOMY AND BURSECTOMY;  Surgeon: Brent Kaufman, MD;  Location: [place];  Service: Orthopedics;  Laterality: Left;   ROTATOR CUFF REPAIR Right 2018   SYNDESMOSIS REPAIR Right 06/25/2021   Procedure: SYNDESMOSIS;  Surgeon: Brent Crocker, MD;  Location: [facility];  Service: Orthopedics;  Laterality: Right;   ULNAR NERVE TRANSPOSITION Left 04/01/2020   Procedure: ULNAR NERVE RELEASE AND TRANSPOSITION;  Surgeon: Brent Kaufman, MD;  Location: [facility];  Service: Orthopedics;  Laterality: Left;   VASECTOMY  1984    reports that he has been smoking cigarettes. He has never used smokeless tobacco. He reports that he does not drink alcohol and does not  use drugs. family history includes Colon cancer (age of onset: 62) in an other family member; Diabetes Mellitus II in his father; Prostate cancer in his father. Allergies  Allergen Reactions   Donepezil Other (See Comments)    Felt weird.       Outpatient Encounter Medications as of 06/24/2022  Medication Sig   acetaminophen (TYLENOL) 500 MG tablet Take 1,000 mg by mouth at bedtime as needed.   aspirin EC 81 MG tablet Take 81 mg by mouth daily. Swallow whole.   Cinnamon 500 MG capsule Take 500 mg by mouth daily.   clopidogrel (PLAVIX) 75 MG tablet Take 75 mg by mouth daily.   diphenhydramine-acetaminophen (TYLENOL PM) 25-500 MG TABS tablet Take 2-3 tablets by mouth at bedtime as needed (sleep).   doxycycline (VIBRA-TABS) 100 MG tablet Take 100 mg by mouth daily as needed (roscea outbreak).   empagliflozin (JARDIANCE) 10 MG TABS tablet Take 1 tablet (10 mg total) by mouth daily.   ferrous sulfate 325 (65 FE) MG tablet Take 325 mg by mouth daily.   folic acid (FOLVITE) 1 MG tablet Take 1 mg by mouth daily.   furosemide (LASIX) 40 MG tablet Take 1 tablet by mouth daily.   latanoprost (XALATAN) 0.005 % ophthalmic solution Place 1 drop into both eyes at bedtime.   loperamide (IMODIUM) 2 MG capsule Take 2 mg by mouth as needed for diarrhea or loose stools.   losartan (COZAAR) 100 MG tablet Take 100 mg by mouth daily.   memantine (NAMENDA) 5 MG tablet Take 5 mg by mouth See admin instructions. Qd x 3 weeks, then 1 bid   metoprolol succinate (TOPROL-XL) 25 MG 24 hr tablet Take 1 tablet by mouth daily.   Multiple Vitamin (MULTI-VITAMINS) TABS Take 1 tablet by mouth daily.   naproxen (NAPROSYN) 500 MG tablet Take 500 mg by mouth 2 (two) times daily as needed for moderate pain.   Omega 3 1000 MG CAPS Take 2,000 mg by mouth daily.   ONETOUCH DELICA LANCETS 99991111 MISC USE ONE LANCET DAILY TO TEST BLOOD GLUCOSE   ONETOUCH VERIO test strip USE ONE STRIP DAILY TO TEST BLOOD GLUCOSE.   oxyCODONE (OXY  IR/ROXICODONE) 5 MG immediate release tablet Take 5 mg by mouth every 4 (four) hours as needed.   potassium chloride SA (KLOR-CON) 20 MEQ tablet Take 20 mEq by mouth 2 (two) times daily.   predniSONE (DELTASONE) 5 MG tablet Take 10 mg by mouth daily.   Probiotic Product (ALIGN PO) Take 1 tablet by mouth every evening.   rosuvastatin (CRESTOR)  20 MG tablet Take 20 mg by mouth at bedtime.   sacubitril-valsartan (ENTRESTO) 24-26 MG Take 1 tablet by mouth 2 (two) times daily.   spironolactone (ALDACTONE) 25 MG tablet Take 1 tablet (25 mg total) by mouth daily.   tiZANidine (ZANAFLEX) 2 MG tablet Take 2 mg by mouth every 6 (six) hours as needed for muscle spasms.   traMADol (ULTRAM) 50 MG tablet Take 50 mg by mouth every 6 (six) hours as needed for moderate pain.   TURMERIC PO Take 1 tablet by mouth daily.   vitamin B-12 (CYANOCOBALAMIN) 1000 MCG tablet Take 1,000 mcg by mouth daily.   vitamin C (ASCORBIC ACID) 250 MG tablet Take 250 mg by mouth daily.   zinc gluconate 50 MG tablet Take 50 mg by mouth daily.   [DISCONTINUED] amLODipine (NORVASC) 10 MG tablet Take 10 mg by mouth daily.   No facility-administered encounter medications on file as of 06/24/2022.     REVIEW OF SYSTEMS  : All other systems reviewed and negative except where noted in the History of Present Illness.   PHYSICAL EXAM: BP 128/82 (BP Location: Left Arm, Patient Position: Sitting, Cuff Size: Normal)   Pulse 72   Ht 5\' 9"  (1.753 m)   Wt 176 lb 8 oz (80.1 kg)   BMI 26.06 kg/m  General: Well developed white male in no acute distress Head: Normocephalic and atraumatic Eyes:  Sclerae anicteric, conjunctiva pink. Ears: Normal auditory acuity Lungs: Clear throughout to auscultation; no W/R/R. Heart: Regular rate and rhythm; no M/R/G. Abdomen: Soft, non-distended.  BS present.  Non-tender. Rectal:  Will be done at the time of colonoscopy. Musculoskeletal: Symmetrical with no gross deformities  Skin: No lesions on visible  extremities Extremities: No edema  Neurological: Alert oriented x 4, grossly non-focal Psychological:  Alert and cooperative. Normal mood and affect  ASSESSMENT AND PLAN: *Intermittent/episodic diarrhea/loose stools with urgency: This has been ongoing for over 2 years as he was seen twice back in the spring 2022 for the same complaints.  He has had a lot of orthopedic issues since then so was never able to return for colonoscopy as we discussed.  Continues to do Imodium daily and align probiotic daily.  We discussed trying Benefiber, he cannot recall whether he had done that.  Consider starting Benefiber powder with 2 teaspoons mixed in 8 ounces of liquid daily to help create bulk to the stool.  Will plan and schedule for colonoscopy with Dr. Rush Landmark as well. *Chronic antiplatelet use with Plavix due to history of coronary artery disease with stenting in October 2022.  Will ask Dr. Johnsie Griffin for clearance to hold Plavix for 5 days prior to procedure and also for general cardiac clearance.  Patient is supposed to have cardiac MRI, that that is not scheduled until May.   CC:  Keane Scrape*

## 2022-06-24 NOTE — Telephone Encounter (Signed)
   Patient Name: Brent Griffin.  DOB: Mar 30, 1946 MRN: [id number]  Primary Cardiologist: Jenkins Rouge, MD  Chart reviewed as part of pre-operative protocol coverage. Pre-op clearance already addressed by colleagues in earlier phone notes. To summarize recommendations:  -His CHF is compensated and the AICD is preventive/elective, no clinical event. Can proceed with colonoscopy before MRI and EP f/u for AICD  -Dr. Erlinda Hong to hold Plavix x 5 days prior to procedure and restart when medically safe to do so. Would prefer to continue ASA throughout.  Will route this bundled recommendation to requesting provider via Epic fax function and remove from pre-op pool. Please call with questions.  Elgie Collard, PA-C 06/24/2022, 3:35 PM

## 2022-06-25 NOTE — Telephone Encounter (Signed)
Called patient and informed him he is ok to hold plavix 5 days before his procedure and he was ok'd by Dr Johnsie Cancel for cardiac clearance as well

## 2022-06-25 NOTE — Telephone Encounter (Signed)
Brent Griffin

## 2022-07-02 ENCOUNTER — Ambulatory Visit: Payer: Medicare Other

## 2022-07-16 ENCOUNTER — Telehealth: Payer: Self-pay | Admitting: Gastroenterology

## 2022-07-16 NOTE — Telephone Encounter (Signed)
The pt was seen in the [facility] and was told  he has Enterobacter cloacae complex.  He states "it's in my blood"  he has an appt for colon on 5/6 and would like to know if he can keep that appt.  He is having the records faxed to [place] from that encounter.

## 2022-07-16 NOTE — Telephone Encounter (Signed)
PT wants to know if Enterobacter cloacae complex would hinder him getting procedure on 5/6

## 2022-07-16 NOTE — Telephone Encounter (Signed)
Patient states he is currently in the ED under isolation. Requesting a call back to discuss his procedure on 08/09/2022, to make sure it is still okay to continue with it. Will discuss further details regarding ED when call is returned. Please advise.

## 2022-07-16 NOTE — Telephone Encounter (Signed)
Left message on machine to call back  

## 2022-07-19 NOTE — Telephone Encounter (Signed)
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 I would check with our hospital endoscopy staff and find out if there are any issues with the patient who has a history of ESBL (isolation time is not clear to me). If we have an isolation time period or the inability to isolate a room he may need to have procedure at the end of the day rather than in the beginning of the day. I think they should be able to accommodate this however. Let me know. GM

## 2022-07-20 NOTE — Telephone Encounter (Signed)
Thank you Patty. I spoke with Misty Stanley as well. I am putting her on this as well, so she is aware and can make any documentation that will be necessary. Looks like he will be on Antibiotics for 2-weeks total, so should be nearly 3-4 weeks before the procedure visit in the hospital. GM

## 2022-07-20 NOTE — Telephone Encounter (Signed)
Per Misty Stanley at [facility] endo the pt can proceed with procedure. He will be on contact precautions and not isolation is needed.

## 2022-07-20 NOTE — Telephone Encounter (Signed)
The pt has been advised that he can keep the appt as planned for procedure.

## 2022-07-27 ENCOUNTER — Ambulatory Visit: Payer: Medicare Other | Attending: Interventional Cardiology | Admitting: Pharmacist

## 2022-07-27 ENCOUNTER — Telehealth: Payer: Self-pay | Admitting: Gastroenterology

## 2022-07-27 VITALS — BP 92/70 | HR 78 | Wt 175.0 lb

## 2022-07-27 DIAGNOSIS — I509 Heart failure, unspecified: Secondary | ICD-10-CM | POA: Diagnosis not present

## 2022-07-27 NOTE — Progress Notes (Signed)
Patient ID: Brent Griffin.                 DOB: 02-22-1946                      MRN: [id number]     HPI: Brent Griffin is a 77 y.o. male referred by Dr. Eden Emms to pharmacy clinic for HF medication management. PMH is significant for CHF, CAD, HTN, DM and memory impairment. Most recent LVEF 40-45% on April 2024 at hospital [place]- previously lower.  Patient had 2 falls in April at his home in the [place].  He was admitted to [facility] to have Enterobacter cloacae in his blood, possible CHF exacerbation and elevated CK.  He was discharged to rehab at [facility] back here in the [place] area.  He presents today accompanied by his wife.  He admits to being dizzy only if he stands up too fast.  Reports no swelling or shortness of breath.  He still feels weak but is working with rehab.  All of his falls were when he was not using his walker, his last fall he fell out of bed.  On Bactrim for his bacteremia.  [facility] has been checking his blood pressure.  Blood pressure ranges from 110's-120s systolic.  Heart rate mainly in the 60s.  Sounds like they check his weight 3 times a week. Patient is taking [place]  Does report the cost is high.   Current CHF meds: spironolactone  daily, Entresto 24/46mg  twice a day, Metoprolol succinate  daily, Jardiance  daily, furosemide  daily Previously tried:  Adherence Assessment  Do you ever forget to take your medication? Yes No  Do you ever skip doses due to side effects? Yes No  Do you have trouble affording your medicines? Yes No  Are you ever unable to pick up your medication due to transportation difficulties? Yes No  Do you ever stop taking your medications because you don't believe they are helping? Yes No  Do you check your weight daily? Yes No   BP goal: <130/80  Family History:  Family History  Problem Relation Age of Onset   Colon cancer Other 56    Prostate cancer Father    Diabetes Mellitus II Father    Liver disease Neg Hx    Esophageal cancer Neg Hx    Pancreatic cancer Neg Hx    Stomach cancer Neg Hx     Social History: Not discussed today  Diet: Not discussed today  Exercise: Currently in rehab.  Wife would like to get a personal trainer for him from the [facility] once he comes home  Home BP readings: 122/76, 108/62, 136/78, 124/66, 128/77, 120/64, 117/68, 112/68 HR 62  Wt Readings from Last 3 Encounters:  07/27/22 175 lb (79.4 kg)  06/24/22 176 lb 8 oz (80.1 kg)  05/10/22 178 lb 9.6 oz (81 kg)   BP Readings from Last 3 Encounters:  07/27/22 92/70  06/24/22 128/82  05/10/22 114/70   Pulse Readings from Last 3 Encounters:  07/27/22 78  06/24/22 72  05/10/22 (!) 36    Renal function: CrCl cannot be calculated (Patient's most recent lab result is older than the maximum 21 days allowed.).  Past Medical History:  Diagnosis Date   Alzheimer disease (HCC)    Arthritis    thumb right hand   Ascending aorta dilatation (HCC)    CAD (coronary artery disease)  Cancer (HCC) 2020   Prostrate- tx Radiation   Diabetes mellitus    type II, not taking medications - 03/31/20   DM (diabetes mellitus) (HCC)    DVT (deep venous thrombosis) (HCC)    ED (erectile dysfunction)    Hammer toe    History of kidney stones    x 3   Hyperlipidemia    Hypertension    Hypokalemia    Insomnia    Ischemic dilated cardiomyopathy (HCC)    Knee effusion    Lipid disorder    Malignant neoplasm of prostate (HCC)    Memory loss    Metatarsalgia    left foot   Metatarsalgia, left foot    Mild renal insufficiency    Mobitz type 1 second degree AV block    Muscle spasm of back    PAC (premature atrial contraction)    Pseudogout    Pulmonary nodule    Recurrent falls    Rosacea    Scoliosis    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    Tobacco use disorder    Trimalleolar fracture    Unsteady gait    Urinary  hesitancy     Current Outpatient Medications on File Prior to Visit  Medication Sig Dispense Refill   acetaminophen (TYLENOL) 500 MG tablet Take 1,000 mg by mouth at bedtime as needed.     Cinnamon 500 MG capsule Take 500 mg by mouth daily.     clopidogrel (PLAVIX) 75 MG tablet Take 75 mg by mouth daily.     empagliflozin (JARDIANCE) 10 MG TABS tablet Take 1 tablet (10 mg total) by mouth daily. 90 tablet 3   folic acid (FOLVITE) 1 MG tablet Take 1 mg by mouth daily.     furosemide (LASIX) 40 MG tablet Take 1 tablet by mouth daily.     memantine (NAMENDA) 5 MG tablet Take 5 mg by mouth See admin instructions. Qd x 3 weeks, then 1 bid     metoprolol succinate (TOPROL-XL) 25 MG 24 hr tablet Take 1 tablet by mouth daily.     rosuvastatin (CRESTOR) 20 MG tablet Take 20 mg by mouth at bedtime.     sacubitril-valsartan (ENTRESTO) 24-26 MG Take 1 tablet by mouth 2 (two) times daily. 60 tablet 11   spironolactone (ALDACTONE) 25 MG tablet Take 1 tablet (25 mg total) by mouth daily. 90 tablet 3   sulfamethoxazole-trimethoprim (BACTRIM DS) 800-160 MG tablet Take 1 tablet by mouth 2 (two) times daily.     tiZANidine (ZANAFLEX) 2 MG tablet Take 2 mg by mouth every 6 (six) hours as needed for muscle spasms.     TURMERIC PO Take 1 tablet by mouth daily.     vitamin B-12 (CYANOCOBALAMIN) 1000 MCG tablet Take 1,000 mcg by mouth daily.     zinc gluconate 50 MG tablet Take 50 mg by mouth daily.     aspirin EC 81 MG tablet Take 81 mg by mouth daily. Swallow whole. (Patient not taking: Reported on 07/27/2022)     diphenhydramine-acetaminophen (TYLENOL PM) 25-500 MG TABS tablet Take 2-3 tablets by mouth at bedtime as needed (sleep).     doxycycline (VIBRA-TABS) 100 MG tablet Take 100 mg by mouth daily as needed (roscea outbreak). (Patient not taking: Reported on 07/27/2022)     ferrous sulfate 325 (65 FE) MG tablet Take 325 mg by mouth daily. (Patient not taking: Reported on 07/27/2022)     latanoprost (XALATAN)  0.005 % ophthalmic solution Place  1 drop into both eyes at bedtime.     loperamide (IMODIUM) 2 MG capsule Take 2 mg by mouth as needed for diarrhea or loose stools.     Multiple Vitamin (MULTI-VITAMINS) TABS Take 1 tablet by mouth daily.     naproxen (NAPROSYN) 500 MG tablet Take 500 mg by mouth 2 (two) times daily as needed for moderate pain.     Omega 3 1000 MG CAPS Take 2,000 mg by mouth daily.     ONETOUCH DELICA LANCETS 33G MISC USE ONE LANCET DAILY TO TEST BLOOD GLUCOSE  3   ONETOUCH VERIO test strip USE ONE STRIP DAILY TO TEST BLOOD GLUCOSE.  3   oxyCODONE (OXY IR/ROXICODONE) 5 MG immediate release tablet Take 5 mg by mouth every 4 (four) hours as needed.     potassium chloride SA (KLOR-CON) 20 MEQ tablet Take 20 mEq by mouth 2 (two) times daily. (Patient not taking: Reported on 07/27/2022)     predniSONE (DELTASONE) 5 MG tablet Take 10 mg by mouth daily.     Probiotic Product (ALIGN PO) Take 1 tablet by mouth every evening.     traMADol (ULTRAM) 50 MG tablet Take 50 mg by mouth every 6 (six) hours as needed for moderate pain.     vitamin C (ASCORBIC ACID) 250 MG tablet Take 250 mg by mouth daily.     No current facility-administered medications on file prior to visit.    Allergies  Allergen Reactions   Donepezil Other (See Comments)    Felt weird.      Assessment/Plan:  1. CHF -  CHF (congestive heart failure) Assessment: Patient appears euvolemic on exam.  His weight at canned and places ranged from 172-175. Blood pressure slightly low today in clinic Blood pressure again in place between 110-120s /60s-70s Patient is high fall risk Appears EF has improved based off of EF done at hospital in [facility]  Plan: Continue Entresto 24 to 26 mg twice a day, Jardiance 10 mg daily, spironolactone 25 mg daily, metoprolol succinate 25 mg daily and furosemide 40 mg daily Do not feel there is any room to increase GDMT at this time Patient advised that if his blood pressure is  consistently less than 100 systolic to please call me I was able to activate a cardiomyopathy grant for patient through the [employer].  Information was printed and given to patient's wife.  Will need to give this information to pharmacy in order to get his [place] and Entresto at no cost.   Thank you   Olene Floss, Pharm.D, BCPS, CPP [facility] [address], [place], [place] [address]  Phone: [phone]; Fax: [phone]

## 2022-07-27 NOTE — Telephone Encounter (Signed)
Dr Meridee Score the pt is still currently hospitalized and has a colon scheduled for 5/6.  He is calling because it has been recommended that he push that out. Please advise on timing-ok to push to next available?

## 2022-07-27 NOTE — Telephone Encounter (Signed)
Inbound call from patient, states he spoke to his cardiologist and was advised to push his procedure out. He would like to reschedule his procedure for 5/6 at [facility] for a later date.

## 2022-07-27 NOTE — Assessment & Plan Note (Signed)
Assessment: Patient appears euvolemic on exam.  His weight at canned and places ranged from 172-175. Blood pressure slightly low today in clinic Blood pressure again in place between 110-120s /60s-70s Patient is high fall risk Appears EF has improved based off of EF done at hospital in [facility]  Plan: Continue Entresto 24 to 26 mg twice a day, Jardiance 10 mg daily, spironolactone 25 mg daily, metoprolol succinate 25 mg daily and furosemide 40 mg daily Do not feel there is any room to increase GDMT at this time Patient advised that if his blood pressure is consistently less than 100 systolic to please call me I was able to activate a cardiomyopathy grant for patient through the [employer].  Information was printed and given to patient's wife.  Will need to give this information to pharmacy in order to get his [place] Pepper and Entresto at no cost.

## 2022-07-27 NOTE — Patient Instructions (Addendum)
Continue spironolactone  daily, Entresto 24/46mg  twice a day, Metoprolol succinate  daily, Jardiance  daily, furosemide  daily  Please call me if blood pressure is consistently in the 90's on top.  [phone]

## 2022-07-27 NOTE — Telephone Encounter (Signed)
Yes it is okay for the patient to be pushed out further. Can schedule out into the summer as needed. Thanks. GM

## 2022-07-28 NOTE — Telephone Encounter (Signed)
The pt has been rescheduled to 09/23/22 at 8 am at [facility] with GM New instructions have been sent to the pt The pt has also been contacted and made aware

## 2022-08-04 ENCOUNTER — Encounter: Payer: Medicare Other | Admitting: Gastroenterology

## 2022-08-10 ENCOUNTER — Telehealth (HOSPITAL_COMMUNITY): Payer: Self-pay | Admitting: Emergency Medicine

## 2022-08-10 NOTE — Telephone Encounter (Signed)
Attempted to call patient regarding upcoming cardiac MR appointment. Left message on voicemail with name and callback number Tyliyah Mcmeekin RN Navigator Cardiac Imaging [facility] [phone] Office [phone] Cell  

## 2022-08-11 ENCOUNTER — Ambulatory Visit (HOSPITAL_COMMUNITY): Admission: RE | Admit: 2022-08-11 | Payer: Medicare Other | Source: Ambulatory Visit

## 2022-08-11 NOTE — Progress Notes (Signed)
CARDIOLOGY CONSULT NOTE       Patient ID: Brent Griffin. MRN: [id number] DOB/AGE: [birth year] 77 y.o.  Referring Physician: Marta Antu Primary Physician: Bailey Mech, PA-C Primary Cardiologist: Eden Emms    HPI:  77 y.o. referred by [name] for CHF/CAD.  First seen 05/10/22 He was admitted at [facility] for fall December. Smoker Abnormal CT with 6 mm nodule ? Volume overload Rx with lasix. CXR with CE small effusions CT suggested multi vessel coronary calcium He had a left rib fracture from fall. Only echo report is not complete Mentions EF 30-50% with dilated RV and MR and LAE this appears to be POC in ER done 03/14/22 Full echo done 08/03/21 showed EF 30-35% with inferior wall motion abnormality and trace MR  Cath done 01/09/21 showed 90% LCX lesion and 50% mid RCA had Xience stent placed 2.75 x 18 mm post dilated to 3.75 mm.  LVEDP normal 11 mmHg Had been seen by Dr Beverely Pace in [facility] with no NSVT Mentions referral to EP for AICD   Smoker. 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 Finished rehab at [facility] GDMT includes Entresto 24/26 mg , Jardiance 10 mg Lasix 40 mg and Toprol 25 mg   Seems to have recovered No angina Volume status ok   ROS All other systems reviewed and negative except as noted above  Past Medical History:  Diagnosis Date   Alzheimer disease (HCC)    Arthritis    thumb right hand   Ascending aorta dilatation (HCC)    CAD (coronary artery disease)    Cancer (HCC) 2020   Prostrate- tx Radiation   Diabetes mellitus    type II, not taking medications - 03/31/20   DM (diabetes mellitus) (HCC)    DVT (deep venous thrombosis) (HCC)    ED (erectile dysfunction)    Hammer toe    History of kidney stones    x 3   Hyperlipidemia     Hypertension    Hypokalemia    Insomnia    Ischemic dilated cardiomyopathy (HCC)    Knee effusion    Lipid disorder    Malignant neoplasm of prostate (HCC)    Memory loss    Metatarsalgia    left foot   Metatarsalgia, left foot    Mild renal insufficiency    Mobitz type 1 second degree AV block    Muscle spasm of back    PAC (premature atrial contraction)    Pseudogout    Pulmonary nodule    Recurrent falls    Rosacea    Scoliosis    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    Tobacco use disorder    Trimalleolar fracture    Unsteady gait    Urinary hesitancy     Family History  Problem Relation Age of Onset   Colon cancer Other 65   Prostate cancer Father    Diabetes Mellitus II Father    Liver disease Neg Hx    Esophageal cancer Neg Hx    Pancreatic cancer Neg Hx    Stomach cancer Neg Hx     Social History   Socioeconomic History   Marital status: Married    Spouse name: Not on file   Number  of children: 0   Years of education: Not on file   Highest education level: Not on file  Occupational History   Not on file  Tobacco Use   Smoking status: Some Days    Years: 30    Types: Cigarettes   Smokeless tobacco: Never   Tobacco comments:    1 a week  Vaping Use   Vaping Use: Never used  Substance and Sexual Activity   Alcohol use: No   Drug use: No   Sexual activity: Not Currently  Other Topics Concern   Not on file  Social History Narrative   Not on file   Social Determinants of Health   Financial Resource Strain: Not on file  Food Insecurity: Not on file  Transportation Needs: Not on file  Physical Activity: Not on file  Stress: Not on file  Social Connections: Not on file  Intimate Partner Violence: Not on file    Past Surgical History:  Procedure Laterality Date   COLONOSCOPY     FOOT FRACTURE SURGERY Bilateral    FOOT SURGERY Left 12/2018   LITHOTRIPSY     MOUTH SURGERY  1990   ORIF ANKLE FRACTURE Right 06/25/2021    Procedure: OPEN TREATMENT OF RIGHT SUBACUTE TRIMALLEOLAR ANKLE FRACTURE;  Surgeon: Terance Hart, MD;  Location: [facility] OR;  Service: Orthopedics;  Laterality: Right;  LENGHT OF SURGERY: 90 MINUTES   ORIF ELBOW FRACTURE Left 04/01/2020   Procedure: OPEN REDUCTION INTERNAL FIXATION (ORIF) SUPRACONDYLAR ELBOW  FRACTURE, OLECRANON OSTEOTOMY AND BURSECTOMY;  Surgeon: Dominica Severin, MD;  Location: [facility] OR;  Service: Orthopedics;  Laterality: Left;   ROTATOR CUFF REPAIR Right 2018   SYNDESMOSIS REPAIR Right 06/25/2021   Procedure: SYNDESMOSIS;  Surgeon: Terance Hart, MD;  Location: [facility] OR;  Service: Orthopedics;  Laterality: Right;   ULNAR NERVE TRANSPOSITION Left 04/01/2020   Procedure: ULNAR NERVE RELEASE AND TRANSPOSITION;  Surgeon: Dominica Severin, MD;  Location: [facility] OR;  Service: Orthopedics;  Laterality: Left;   VASECTOMY  1984      Current Outpatient Medications:    acetaminophen (TYLENOL) 500 MG tablet, Take 1,000 mg by mouth at bedtime as needed., Disp: , Rfl:    aspirin EC 81 MG tablet, Take 81 mg by mouth daily. Swallow whole., Disp: , Rfl:    Cinnamon 500 MG capsule, Take 500 mg by mouth daily., Disp: , Rfl:    clopidogrel (PLAVIX) 75 MG tablet, Take 75 mg by mouth daily., Disp: , Rfl:    diphenhydramine-acetaminophen (TYLENOL PM) 25-500 MG TABS tablet, Take 2-3 tablets by mouth at bedtime as needed (sleep)., Disp: , Rfl:    doxycycline (VIBRA-TABS) 100 MG tablet, Take 100 mg by mouth daily as needed (roscea outbreak)., Disp: , Rfl:    empagliflozin (JARDIANCE) 10 MG TABS tablet, Take 1 tablet (10 mg total) by mouth daily., Disp: 90 tablet, Rfl: 3   folic acid (FOLVITE) 1 MG tablet, Take 1 mg by mouth daily., Disp: , Rfl:    furosemide (LASIX) 40 MG tablet, Take 1 tablet by mouth daily., Disp: , Rfl:    latanoprost (XALATAN) 0.005 % ophthalmic solution, Place 1 drop into both eyes at bedtime., Disp: , Rfl:    loperamide (IMODIUM) 2 MG capsule, Take 2 mg by mouth as needed for  diarrhea or loose stools., Disp: , Rfl:    memantine (NAMENDA) 5 MG tablet, Take 5 mg by mouth See admin instructions. Qd x 3 weeks, then 1 bid, Disp: , Rfl:    metoprolol  succinate (TOPROL-XL) 25 MG 24 hr tablet, Take 1 tablet by mouth daily., Disp: , Rfl:    Multiple Vitamin (MULTI-VITAMINS) TABS, Take 1 tablet by mouth daily., Disp: , Rfl:    naproxen (NAPROSYN) 500 MG tablet, Take 500 mg by mouth 2 (two) times daily as needed for moderate pain., Disp: , Rfl:    Omega 3 1000 MG CAPS, Take 2,000 mg by mouth daily., Disp: , Rfl:    ONETOUCH DELICA LANCETS 33G MISC, USE ONE LANCET DAILY TO TEST BLOOD GLUCOSE, Disp: , Rfl: 3   ONETOUCH VERIO test strip, USE ONE STRIP DAILY TO TEST BLOOD GLUCOSE., Disp: , Rfl: 3   oxyCODONE (OXY IR/ROXICODONE) 5 MG immediate release tablet, Take 5 mg by mouth every 4 (four) hours as needed., Disp: , Rfl:    potassium chloride SA (KLOR-CON) 20 MEQ tablet, Take 20 mEq by mouth 2 (two) times daily., Disp: , Rfl:    predniSONE (DELTASONE) 5 MG tablet, Take 10 mg by mouth daily., Disp: , Rfl:    Probiotic Product (ALIGN PO), Take 1 tablet by mouth every evening., Disp: , Rfl:    rosuvastatin (CRESTOR) 20 MG tablet, Take 20 mg by mouth at bedtime., Disp: , Rfl:    sacubitril-valsartan (ENTRESTO) 24-26 MG, Take 1 tablet by mouth 2 (two) times daily., Disp: 60 tablet, Rfl: 11   spironolactone (ALDACTONE) 25 MG tablet, Take 1 tablet (25 mg total) by mouth daily., Disp: 90 tablet, Rfl: 3   sulfamethoxazole-trimethoprim (BACTRIM DS) 800-160 MG tablet, Take 1 tablet by mouth 2 (two) times daily., Disp: , Rfl:    tiZANidine (ZANAFLEX) 2 MG tablet, Take 2 mg by mouth every 6 (six) hours as needed for muscle spasms., Disp: , Rfl:    traMADol (ULTRAM) 50 MG tablet, Take 50 mg by mouth every 6 (six) hours as needed for moderate pain., Disp: , Rfl:    TURMERIC PO, Take 1 tablet by mouth daily., Disp: , Rfl:    vitamin B-12 (CYANOCOBALAMIN) 1000 MCG tablet, Take 1,000 mcg by mouth  daily., Disp: , Rfl:    vitamin C (ASCORBIC ACID) 250 MG tablet, Take 250 mg by mouth daily., Disp: , Rfl:    zinc gluconate 50 MG tablet, Take 50 mg by mouth daily., Disp: , Rfl:     Physical Exam: Blood pressure 114/72, pulse 71, height 5\' 9"  (1.753 m), weight 174 lb 8 oz (79.2 kg), SpO2 97 %.    Affect appropriate Chronically ill male  HEENT: normal Neck supple with no adenopathy JVP normal no bruits no thyromegaly Lungs clear with no wheezing and good diaphragmatic motion Heart:  S1/S2 no murmur, no rub, gallop or click PMI normal Abdomen: benighn, BS positve, no tenderness, no AAA no bruit.  No HSM or HJR Distal pulses intact with no bruits No edema Neuro non-focal Skin warm and dry No muscular weakness   Labs:   Lab Results  Component Value Date   WBC 7.8 06/26/2021   HGB 13.1 06/26/2021   HCT 47.0 05/10/2022   MCV 89.4 06/26/2021   PLT 191 06/26/2021   No results for input(s): "NA", "K", "CL", "CO2", "BUN", "CREATININE", "CALCIUM", "PROT", "BILITOT", "ALKPHOS", "ALT", "AST", "GLUCOSE" in the last 168 hours.  Invalid input(s): "LABALBU" Lab Results  Component Value Date   CKTOTAL 401 (H) 04/19/2021   No results found for: "CHOL" No results found for: "HDL" No results found for: "LDLCALC" No results found for: "TRIG" No results found for: "CHOLHDL" No results found for: "LDLDIRECT"  Radiology: No results found.  EKG: 09/23/21 SR rate 73 nonspecific ST changes   ASSESSMENT AND PLAN:   CAD:  LCX stent 01/10/71 with 50% residual RCA. Continue beta blocker ASA and statin no angina  Ischemic DCM:  EF not clear from notes POCUS recent admission December indicates 30-50% Dr Sharalyn Ink note says 25-30% but TTE from May 2023 says 30-35% On good GDMT needs f/u cardiac MRI HLD:  continue statin labs with primary  Smoking:  needs f/u lung cancer CT for 6 mm nodule per primary  Dementia:  continue dementia with age may have some bearing on primary prevention AICD QT:   prolonged check labs today will have EP address as well after MRI   Cardiac MRI Refer to [facility] EP for long QT ischemic DCM and possible need for AICD   F/U 6 months  Signed: Charlton Haws 08/18/2022, 9:29 AM

## 2022-08-12 ENCOUNTER — Telehealth: Payer: Self-pay

## 2022-08-12 DIAGNOSIS — I255 Ischemic cardiomyopathy: Secondary | ICD-10-CM

## 2022-08-12 NOTE — Telephone Encounter (Signed)
Patient called back. Informed him of the referral. Patient will await a call to get scheduled with Dr. Lalla Brothers.

## 2022-08-12 NOTE — Telephone Encounter (Signed)
-----   Message from Wendall Stade, MD sent at 08/11/2022  1:01 PM EDT ----- Needs to see Lalla Brothers after his cardiac MRI Ischemic DCM ? Need for AICD

## 2022-08-12 NOTE — Telephone Encounter (Signed)
Left message for patient to call back. Will go ahead a place referral for now.

## 2022-08-17 ENCOUNTER — Institutional Professional Consult (permissible substitution): Payer: Medicare Other | Admitting: Cardiology

## 2022-08-18 ENCOUNTER — Ambulatory Visit: Payer: Medicare Other | Attending: Cardiovascular Disease | Admitting: Cardiovascular Disease

## 2022-08-18 ENCOUNTER — Encounter: Payer: Self-pay | Admitting: Cardiovascular Disease

## 2022-08-18 VITALS — BP 114/72 | HR 71 | Ht 69.0 in | Wt 174.5 lb

## 2022-08-18 DIAGNOSIS — R9431 Abnormal electrocardiogram [ECG] [EKG]: Secondary | ICD-10-CM | POA: Insufficient documentation

## 2022-08-18 DIAGNOSIS — Z01812 Encounter for preprocedural laboratory examination: Secondary | ICD-10-CM | POA: Insufficient documentation

## 2022-08-18 DIAGNOSIS — I255 Ischemic cardiomyopathy: Secondary | ICD-10-CM | POA: Diagnosis not present

## 2022-08-18 DIAGNOSIS — I2510 Atherosclerotic heart disease of native coronary artery without angina pectoris: Secondary | ICD-10-CM | POA: Diagnosis not present

## 2022-08-18 NOTE — Patient Instructions (Addendum)
Medication Instructions:  Your physician recommends that you continue on your current medications as directed. Please refer to the Current Medication list given to you today.  *If you need a refill on your cardiac medications before your next appointment, please call your pharmacy*  Lab Work: Friday May 24th anytime between 7:15am and 4:00pm: CBC, BMET  Testing/Procedures: Your physician has requested that you have a cardiac MRI. Cardiac MRI uses a computer to create images of your heart as its beating, producing both still and moving pictures of your heart and major blood vessels. For further information please visit [email or web address]. Please follow the instruction sheet given to you today for more information.   Follow-Up: At [facility], you and your health needs are our priority.  As part of our continuing mission to provide you with exceptional heart care, we have created designated Provider Care Teams.  These Care Teams include your primary Cardiologist (physician) and Advanced Practice Providers (APPs -  Physician Assistants and Nurse Practitioners) who all work together to provide you with the care you need, when you need it.  Your next appointment:   6 month(s)  The format for your next appointment:   In Person  Provider:   Charlton Haws, MD {

## 2022-08-27 ENCOUNTER — Ambulatory Visit: Payer: Medicare Other | Attending: Cardiovascular Disease

## 2022-08-27 ENCOUNTER — Telehealth (HOSPITAL_COMMUNITY): Payer: Self-pay | Admitting: *Deleted

## 2022-08-27 DIAGNOSIS — I255 Ischemic cardiomyopathy: Secondary | ICD-10-CM

## 2022-08-27 DIAGNOSIS — Z01812 Encounter for preprocedural laboratory examination: Secondary | ICD-10-CM

## 2022-08-27 NOTE — Telephone Encounter (Signed)
Attempted to call patient regarding upcoming cardiac CT appointment. °Left message on voicemail with name and callback number ° °Ayat Drenning RN Navigator Cardiac Imaging °[facility] °[phone] Office °[phone] Cell ° °

## 2022-08-28 LAB — BASIC METABOLIC PANEL
BUN/Creatinine Ratio: 18 (ref 10–24)
BUN: 22 mg/dL (ref 8–27)
CO2: 22 mmol/L (ref 20–29)
Calcium: 9.5 mg/dL (ref 8.6–10.2)
Chloride: 104 mmol/L (ref 96–106)
Creatinine, Ser: 1.21 mg/dL (ref 0.76–1.27)
Glucose: 93 mg/dL (ref 70–99)
Potassium: 4.7 mmol/L (ref 3.5–5.2)
Sodium: 140 mmol/L (ref 134–144)
eGFR: 62 mL/min/{1.73_m2} (ref >59)

## 2022-08-28 LAB — CBC
Hematocrit: 43.9 % (ref 37.5–51.0)
Hemoglobin: 14.5 g/dL (ref 13.0–17.7)
MCH: 30.5 pg (ref 26.6–33.0)
MCHC: 33 g/dL (ref 31.5–35.7)
MCV: 92 fL (ref 79–97)
Platelets: 179 10*3/uL (ref 150–450)
RBC: 4.75 x10E6/uL (ref 4.14–5.80)
RDW: 14.7 % (ref 11.6–15.4)
WBC: 5.4 10*3/uL (ref 3.4–10.8)

## 2022-08-31 ENCOUNTER — Other Ambulatory Visit: Payer: Self-pay | Admitting: Cardiovascular Disease

## 2022-08-31 ENCOUNTER — Ambulatory Visit (HOSPITAL_COMMUNITY)
Admission: RE | Admit: 2022-08-31 | Discharge: 2022-08-31 | Disposition: A | Discharge status: Home / Self Care | Payer: Medicare Other | Source: Ambulatory Visit | Attending: Cardiovascular Disease | Admitting: Cardiovascular Disease

## 2022-08-31 DIAGNOSIS — I255 Ischemic cardiomyopathy: Secondary | ICD-10-CM | POA: Insufficient documentation

## 2022-08-31 MED ORDER — GADOBUTROL 1 MMOL/ML IV SOLN
10.0000 mL | Freq: Once | INTRAVENOUS | Status: AC | PRN
  Administered 2022-08-31: 10 mL via INTRAVENOUS

## 2022-09-09 NOTE — Progress Notes (Signed)
Electrophysiology Office Note:    Date:  09/10/2022   ID:  Phillip Heal., DOB 1946/02/09, MRN [id number]  [facility] Cardiologist:  Charlton Haws, MD  [facility] Electrophysiologist:  Lanier Prude, MD   Referring MD: Wendall Stade, MD   Chief Complaint: Cardiomyopathy  History of Present Illness:    Brent Griffin. is a 77 y.o. male who I am seeing today for an evaluation of heart failure at the request of Dr. Eden Emms.  The patient last saw Dr. Eden Emms Aug 18, 2022.  His medical history includes hypertension, tobacco abuse, hyperlipidemia, coronary artery disease post PCI and chronic systolic heart failure.  Also with a history of DVT, bacteremia (Enterobacter), diabetes  He is with family today in clinic.  He is ambulatory with the assistance of a cane for stability.  Has some shortness of breath with exertion.  He has been off steroids for at least 3 months.      Their past medical, social and family history was reveiwed.   ROS:   Please see the history of present illness.    All other systems reviewed and are negative.  EKGs/Labs/Other Studies Reviewed:    The following studies were reviewed today:  Aug 31, 2022 cardiac MRI Ejection fraction 33% Circumflex territory scar  May 10, 2022 EKG shows sinus rhythm, narrow QRS  EKG:  The ekg ordered today demonstrates sinus rhythm.  QRS duration 108 ms.   Physical Exam:    VS:  BP (!) 88/60   Pulse (!) 54   Ht 5\' 9"  (1.753 m)   Wt 177 lb 6.4 oz (80.5 kg)   SpO2 97%   BMI 26.20 kg/m     Wt Readings from Last 3 Encounters:  09/10/22 177 lb 6.4 oz (80.5 kg)  08/18/22 174 lb 8 oz (79.2 kg)  07/27/22 175 lb (79.4 kg)     GEN:  Well nourished, well developed in no acute distress CARDIAC: RRR, no murmurs, rubs, gallops RESPIRATORY:  Clear to auscultation without rales, wheezing or rhonchi       ASSESSMENT AND PLAN:    1. Chronic systolic heart failure (HCC)   2. Ischemic  cardiomyopathy   3. Coronary artery disease involving native coronary artery of native heart without angina pectoris     #Chronic systolic heart failure #Ischemic cardiomyopathy #Coronary artery disease NYHA class II-III.  Warm and dry on exam.  Last EF 33% on cardiac MRI.  Large scar in circumflex territory.  I discussed his risk of sudden cardiac death during today's clinic appointment.  We discussed the role of ICD therapy.  The patient has an ischemic CM (EF 33%), NYHA Class III CHF, and CAD.  He is referred by Dr Eden Emms for risk stratification of sudden death and consideration of ICD implantation.  At this time, he meets criteria for ICD implantation for primary prevention of sudden death.  I have had a thorough discussion with the patient reviewing options.  The patient and their family (if available) have had opportunities to ask questions and have them answered. The patient and I have decided together through a shared decision making process to proceed with ICD implant at this time.    Risks, benefits, alternatives to ICD implantation were discussed in detail with the patient today. The patient understands that the risks include but are not limited to bleeding, infection, pneumothorax, perforation, tamponade, vascular damage, renal failure, MI, stroke, death, inappropriate shocks, and lead dislodgement and wishes to proceed.  We will therefore schedule device implantation at the next available time.  Plan for Medtronic VVI ICD.  I would have him hold his Plavix for 5 days prior to the procedure.  He will continue aspirin uninterrupted.  He will also hold his Lasix and Entresto the morning of the procedure given his relatively low blood pressures.     Signed, Rossie Muskrat. Lalla Brothers, MD, [facility], [facility] 09/10/2022 10:26 AM    Electrophysiology [facility]

## 2022-09-10 ENCOUNTER — Encounter: Payer: Self-pay | Admitting: Cardiology

## 2022-09-10 ENCOUNTER — Ambulatory Visit: Payer: Medicare Other | Attending: Cardiology | Admitting: Cardiology

## 2022-09-10 VITALS — BP 88/60 | HR 54 | Ht 69.0 in | Wt 177.4 lb

## 2022-09-10 DIAGNOSIS — I255 Ischemic cardiomyopathy: Secondary | ICD-10-CM | POA: Diagnosis present

## 2022-09-10 DIAGNOSIS — I2510 Atherosclerotic heart disease of native coronary artery without angina pectoris: Secondary | ICD-10-CM | POA: Diagnosis present

## 2022-09-10 DIAGNOSIS — I5022 Chronic systolic (congestive) heart failure: Secondary | ICD-10-CM | POA: Diagnosis present

## 2022-09-10 NOTE — Addendum Note (Signed)
Addended by: Frutoso Schatz on: 09/10/2022 10:51 AM   Modules accepted: Orders

## 2022-09-10 NOTE — Patient Instructions (Signed)
Medication Instructions:  Your physician recommends that you continue on your current medications as directed. Please refer to the Current Medication list given to you today.  *If you need a refill on your cardiac medications before your next appointment, please call your pharmacy*   Lab Work: BMET and CBC prior to your procedure.   Testing/Procedures: Your physician has recommended that you have a defibrillator inserted. An implantable cardioverter defibrillator (ICD) is a small device that is placed in your chest or, in rare cases, your abdomen. This device uses electrical pulses or shocks to help control life-threatening, irregular heartbeats that could lead the heart to suddenly stop beating (sudden cardiac arrest). Leads are attached to the ICD that goes into your heart. This is done in the hospital and usually requires an overnight stay. Please see the instruction sheet given to you today for more information.  Follow-Up: At [facility], you and your health needs are our priority.  As part of our continuing mission to provide you with exceptional heart care, we have created designated Provider Care Teams.  These Care Teams include your primary Cardiologist (physician) and Advanced Practice Providers (APPs -  Physician Assistants and Nurse Practitioners) who all work together to provide you with the care you need, when you need it.  We will call you to schedule your follow up appointments.

## 2022-09-13 ENCOUNTER — Telehealth: Payer: Self-pay

## 2022-09-13 NOTE — Progress Notes (Signed)
Patient called and wanted to cancel procedure on 6/17 with dr. Meridee Score and wanted to reschedule in several weeks. I gave him the office number to call to reschedule.

## 2022-09-13 NOTE — Telephone Encounter (Signed)
Mansouraty, Netty Starring., MD  Loretha Stapler, RN; Viviana Simpler, RN Thalia Turkington, He will get 1 more opportunity to reschedule, after that, he needs to be seen back in clinic before rescheduling, since this is multiple rescheduled appointments. Thanks. GM       Previous Messages  Attached Notes  Progress Notes by Viviana Simpler, RN at 09/13/2022  8:28 AM  Author: Viviana Simpler, RN Service: Endoscopy Author Type: Registered Nurse  Filed: 09/13/2022  8:29 AM Date of Service: 09/13/2022  8:28 AM Note Type: Progress Notes  Status: Signed Editor: Viviana Simpler, RN (Registered Nurse)  Patient called and wanted to cancel procedure on 6/17 with dr. Meridee Score and wanted to reschedule in several weeks. I gave him the office number to call to reschedule.

## 2022-09-14 NOTE — Telephone Encounter (Signed)
Colon has been scheduled for 9/17 at 8 am at [facility] with GM   Left message on machine to call back

## 2022-09-15 NOTE — Telephone Encounter (Signed)
Left message on machine to call back  

## 2022-09-16 NOTE — Telephone Encounter (Signed)
Left message on machine to call back   June 25, 2022 Marlowe Kays, [facility]      06/25/22  2:10 PM Note Called patient and informed him he is ok to hold plavix 5 days before his procedure and he was ok'd by Dr Eden Emms for cardiac clearance as well

## 2022-09-17 ENCOUNTER — Telehealth: Payer: Self-pay | Admitting: Cardiovascular Disease

## 2022-09-17 NOTE — Telephone Encounter (Signed)
Pt c/o medication issue:  1. Name of Medication: clopidogrel (PLAVIX) 75 MG tablet   2. How are you currently taking this medication (dosage and times per day)?   3. Are you having a reaction (difficulty breathing--STAT)?   4. What is your medication issue? Patient is calling to know if he still needs to take this medication. If so he needs a refills and would like it sent to [id number] - [place], [place] - [address] AT [facility] [address]

## 2022-09-17 NOTE — Telephone Encounter (Signed)
Per Dr. Eden Emms, stent to LCX placed over a year ago ok to stop plavix and take 81 mg ASA  Patient verbalized understanding and will stop plavix and just take ASA.

## 2022-09-17 NOTE — Telephone Encounter (Signed)
The pt has been cancelled per the pt request. He will call back when he would like to reschedule.

## 2022-09-23 ENCOUNTER — Encounter (HOSPITAL_COMMUNITY): Admission: RE | Payer: Self-pay | Source: Home / Self Care

## 2022-09-23 ENCOUNTER — Ambulatory Visit (HOSPITAL_COMMUNITY): Admission: RE | Admit: 2022-09-23 | Payer: Medicare Other | Source: Home / Self Care | Admitting: Gastroenterology

## 2022-09-23 SURGERY — COLONOSCOPY WITH PROPOFOL
Anesthesia: Monitor Anesthesia Care

## 2022-10-13 ENCOUNTER — Ambulatory Visit: Payer: Medicare Other | Attending: Cardiovascular Disease

## 2022-10-13 DIAGNOSIS — I5022 Chronic systolic (congestive) heart failure: Secondary | ICD-10-CM

## 2022-10-13 DIAGNOSIS — I255 Ischemic cardiomyopathy: Secondary | ICD-10-CM

## 2022-10-13 DIAGNOSIS — I2510 Atherosclerotic heart disease of native coronary artery without angina pectoris: Secondary | ICD-10-CM

## 2022-10-14 LAB — BASIC METABOLIC PANEL
BUN/Creatinine Ratio: 18 (ref 10–24)
BUN: 23 mg/dL (ref 8–27)
CO2: 23 mmol/L (ref 20–29)
Calcium: 9.2 mg/dL (ref 8.6–10.2)
Chloride: 103 mmol/L (ref 96–106)
Creatinine, Ser: 1.28 mg/dL — ABNORMAL HIGH (ref 0.76–1.27)
Glucose: 91 mg/dL (ref 70–99)
Potassium: 4.1 mmol/L (ref 3.5–5.2)
Sodium: 141 mmol/L (ref 134–144)
eGFR: 58 mL/min/{1.73_m2} — ABNORMAL LOW (ref >59)

## 2022-10-14 LAB — CBC WITH DIFFERENTIAL/PLATELET
Basophils Absolute: 0 10*3/uL (ref 0.0–0.2)
Basos: 1 %
EOS (ABSOLUTE): 0 10*3/uL (ref 0.0–0.4)
Eos: 1 %
Hematocrit: 44.5 % (ref 37.5–51.0)
Hemoglobin: 14.5 g/dL (ref 13.0–17.7)
Immature Grans (Abs): 0 10*3/uL (ref 0.0–0.1)
Immature Granulocytes: 0 %
Lymphocytes Absolute: 0.9 10*3/uL (ref 0.7–3.1)
Lymphs: 16 %
MCH: 30.7 pg (ref 26.6–33.0)
MCHC: 32.6 g/dL (ref 31.5–35.7)
MCV: 94 fL (ref 79–97)
Monocytes Absolute: 0.6 10*3/uL (ref 0.1–0.9)
Monocytes: 11 %
Neutrophils Absolute: 3.7 10*3/uL (ref 1.4–7.0)
Neutrophils: 71 %
Platelets: 169 10*3/uL (ref 150–450)
RBC: 4.73 x10E6/uL (ref 4.14–5.80)
RDW: 13.5 % (ref 11.6–15.4)
WBC: 5.3 10*3/uL (ref 3.4–10.8)

## 2022-10-18 ENCOUNTER — Ambulatory Visit (HOSPITAL_COMMUNITY)
Admission: RE | Admit: 2022-10-18 | Discharge: 2022-10-18 | Disposition: A | Discharge status: Home / Self Care | Payer: Medicare Other | Attending: Cardiology | Admitting: Cardiology

## 2022-10-18 ENCOUNTER — Ambulatory Visit (HOSPITAL_COMMUNITY): Payer: Medicare Other

## 2022-10-18 ENCOUNTER — Other Ambulatory Visit: Payer: Self-pay

## 2022-10-18 ENCOUNTER — Ambulatory Visit (HOSPITAL_COMMUNITY)
Admission: RE | Disposition: A | Discharge status: Home / Self Care | Payer: Self-pay | Source: Home / Self Care | Attending: Cardiology

## 2022-10-18 DIAGNOSIS — I110 Hypertensive heart disease with heart failure: Secondary | ICD-10-CM | POA: Diagnosis not present

## 2022-10-18 DIAGNOSIS — I429 Cardiomyopathy, unspecified: Secondary | ICD-10-CM | POA: Diagnosis not present

## 2022-10-18 DIAGNOSIS — Z7982 Long term (current) use of aspirin: Secondary | ICD-10-CM | POA: Diagnosis not present

## 2022-10-18 DIAGNOSIS — Z86718 Personal history of other venous thrombosis and embolism: Secondary | ICD-10-CM | POA: Diagnosis not present

## 2022-10-18 DIAGNOSIS — Z79899 Other long term (current) drug therapy: Secondary | ICD-10-CM | POA: Diagnosis not present

## 2022-10-18 DIAGNOSIS — I5022 Chronic systolic (congestive) heart failure: Secondary | ICD-10-CM | POA: Insufficient documentation

## 2022-10-18 DIAGNOSIS — I2510 Atherosclerotic heart disease of native coronary artery without angina pectoris: Secondary | ICD-10-CM | POA: Insufficient documentation

## 2022-10-18 DIAGNOSIS — E785 Hyperlipidemia, unspecified: Secondary | ICD-10-CM | POA: Diagnosis not present

## 2022-10-18 DIAGNOSIS — I255 Ischemic cardiomyopathy: Secondary | ICD-10-CM | POA: Insufficient documentation

## 2022-10-18 DIAGNOSIS — Z87891 Personal history of nicotine dependence: Secondary | ICD-10-CM | POA: Diagnosis not present

## 2022-10-18 DIAGNOSIS — Z7902 Long term (current) use of antithrombotics/antiplatelets: Secondary | ICD-10-CM | POA: Insufficient documentation

## 2022-10-18 DIAGNOSIS — E119 Type 2 diabetes mellitus without complications: Secondary | ICD-10-CM | POA: Diagnosis not present

## 2022-10-18 HISTORY — PX: ICD IMPLANT: EP1208

## 2022-10-18 LAB — GLUCOSE, CAPILLARY: Glucose-Capillary: 83 mg/dL (ref 70–99)

## 2022-10-18 SURGERY — ICD IMPLANT

## 2022-10-18 MED ORDER — LIDOCAINE HCL (PF) 1 % IJ SOLN
INTRAMUSCULAR | Status: AC
  Filled 2022-10-18: qty 60

## 2022-10-18 MED ORDER — CEFAZOLIN SODIUM-DEXTROSE 2-4 GM/100ML-% IV SOLN
INTRAVENOUS | Status: AC
  Filled 2022-10-18: qty 100

## 2022-10-18 MED ORDER — FENTANYL CITRATE (PF) 100 MCG/2ML IJ SOLN
INTRAMUSCULAR | Status: DC | PRN
  Administered 2022-10-18: 25 ug via INTRAVENOUS

## 2022-10-18 MED ORDER — ONDANSETRON HCL 4 MG/2ML IJ SOLN: 4.0000 mg | Freq: Four times a day (QID) | INTRAMUSCULAR | Status: DC | PRN

## 2022-10-18 MED ORDER — LIDOCAINE HCL 1 % IJ SOLN
INTRAMUSCULAR | Status: AC
  Filled 2022-10-18: qty 20

## 2022-10-18 MED ORDER — CEFAZOLIN SODIUM-DEXTROSE 2-4 GM/100ML-% IV SOLN
2.0000 g | INTRAVENOUS | Status: AC
  Administered 2022-10-18: 2 g via INTRAVENOUS

## 2022-10-18 MED ORDER — SODIUM CHLORIDE 0.9 % IV SOLN
INTRAVENOUS | Status: AC
  Filled 2022-10-18: qty 2

## 2022-10-18 MED ORDER — HEPARIN (PORCINE) IN NACL 1000-0.9 UT/500ML-% IV SOLN
INTRAVENOUS | Status: DC | PRN
  Administered 2022-10-18: 500 mL

## 2022-10-18 MED ORDER — SODIUM CHLORIDE 0.9 % IV SOLN: INTRAVENOUS | Status: DC

## 2022-10-18 MED ORDER — SODIUM CHLORIDE 0.9 % IV SOLN
80.0000 mg | INTRAVENOUS | Status: AC
  Administered 2022-10-18: 80 mg

## 2022-10-18 MED ORDER — POVIDONE-IODINE 10 % EX SWAB
2.0000 | Freq: Once | CUTANEOUS | Status: AC
  Administered 2022-10-18: 2 via TOPICAL

## 2022-10-18 MED ORDER — MIDAZOLAM HCL 5 MG/5ML IJ SOLN
INTRAMUSCULAR | Status: DC | PRN
  Administered 2022-10-18: 1 mg via INTRAVENOUS

## 2022-10-18 MED ORDER — FENTANYL CITRATE (PF) 100 MCG/2ML IJ SOLN
INTRAMUSCULAR | Status: AC
  Filled 2022-10-18: qty 2

## 2022-10-18 MED ORDER — MIDAZOLAM HCL 5 MG/5ML IJ SOLN
INTRAMUSCULAR | Status: AC
  Filled 2022-10-18: qty 5

## 2022-10-18 MED ORDER — CHLORHEXIDINE GLUCONATE 4 % EX SOLN: 4.0000 | Freq: Once | CUTANEOUS | Status: DC

## 2022-10-18 MED ORDER — ACETAMINOPHEN 325 MG PO TABS: 325.0000 mg | ORAL_TABLET | ORAL | Status: DC | PRN

## 2022-10-18 MED ORDER — LIDOCAINE HCL (PF) 1 % IJ SOLN
INTRAMUSCULAR | Status: DC | PRN
  Administered 2022-10-18: 50 mL

## 2022-10-18 SURGICAL SUPPLY — 9 items
CABLE SURGICAL S-101-97-12 (CABLE) ×1 IMPLANT
ICD COBALT XT DR DDPA2D4 (ICD Generator) IMPLANT
LEAD CAPSURE NOVUS 5076-52CM (Lead) IMPLANT
LEAD SPRINT QUAT SEC 6935M-62 (Lead) IMPLANT
PAD DEFIB RADIO PHYSIO CONN (PAD) ×1 IMPLANT
SHEATH 7FR PRELUDE SNAP 13 (SHEATH) IMPLANT
SHEATH 9FR PRELUDE SNAP 13 (SHEATH) IMPLANT
SHEATH PROBE COVER 6X72 (BAG) IMPLANT
TRAY PACEMAKER INSERTION (PACKS) ×1 IMPLANT

## 2022-10-18 NOTE — H&P (Signed)
Electrophysiology Office Note:     Date:  10/18/2022    ID:  Brent Griffin., DOB 12/10/1945, MRN [id number]   [facility] Cardiologist:  Brent Haws, MD  [facility] Electrophysiologist:  Brent Prude, MD    Referring MD: Brent Stade, MD    Chief Complaint: Cardiomyopathy   History of Present Illness:     Brent Griffin. is a 77 y.o. male who I am seeing today for an evaluation of heart failure at the request of Dr. Eden Griffin.  The patient last saw Dr. Eden Griffin Aug 18, 2022.   His medical history includes hypertension, tobacco abuse, hyperlipidemia, coronary artery disease post PCI and chronic systolic heart failure.  Also with a history of DVT, bacteremia (Enterobacter), diabetes   He is with family today in clinic.  He is ambulatory with the assistance of a cane for stability.  Has some shortness of breath with exertion.  He has been off steroids for at least 3 months.    Presents for ICD today. Procedure reviewed.   Objective Their past medical, social and family history was reveiwed.     ROS:   Please see the history of present illness.    All other systems reviewed and are negative.   EKGs/Labs/Other Studies Reviewed:     The following studies were reviewed today:   Aug 31, 2022 cardiac MRI Ejection fraction 33% Circumflex territory scar   May 10, 2022 EKG shows sinus rhythm, narrow QRS   EKG:  The ekg ordered today demonstrates sinus rhythm.  QRS duration 108 ms.     Physical Exam:     VS:  BP 128/73   Pulse 54   Ht 5\' 9"  (1.753 m)   Wt 177 lb 6.4 oz (80.5 kg)   SpO2 97%   BMI 26.20 kg/m         Wt Readings from Last 3 Encounters:  09/10/22 177 lb 6.4 oz (80.5 kg)  08/18/22 174 lb 8 oz (79.2 kg)  07/27/22 175 lb (79.4 kg)      GEN:  Well nourished, well developed in no acute distress CARDIAC: RRR, no murmurs, rubs, gallops RESPIRATORY:  Clear to auscultation without rales, wheezing or rhonchi           Assessment ASSESSMENT AND PLAN:     1. Chronic systolic heart failure (HCC)   2. Ischemic cardiomyopathy   3. Coronary artery disease involving native coronary artery of native heart without angina pectoris       #Chronic systolic heart failure #Ischemic cardiomyopathy #Coronary artery disease NYHA class II-III.  Warm and dry on exam.  Last EF 33% on cardiac MRI.  Large scar in circumflex territory.  I discussed his risk of sudden cardiac death during today's clinic appointment.  We discussed the role of ICD therapy.   The patient has an ischemic CM (EF 33%), NYHA Class III CHF, and CAD.  He is referred by Dr Brent Griffin for risk stratification of sudden death and consideration of ICD implantation.  At this time, he meets criteria for ICD implantation for primary prevention of sudden death.  I have had a thorough discussion with the patient reviewing options.  The patient and their family (if available) have had opportunities to ask questions and have them answered. The patient and I have decided together through a shared decision making process to proceed with ICD implant at this time.     Risks, benefits, alternatives to ICD implantation were discussed in detail  with the patient today. The patient understands that the risks include but are not limited to bleeding, infection, pneumothorax, perforation, tamponade, vascular damage, renal failure, MI, stroke, death, inappropriate shocks, and lead dislodgement and wishes to proceed.  We will therefore schedule device implantation at the next available time.   Plan for Medtronic VVI ICD.   I would have him hold his Plavix for 5 days prior to the procedure.  He will continue aspirin uninterrupted.  He will also hold his Lasix and Entresto the morning of the procedure given his relatively low blood pressures.    Presents for ICD today. Procedure reviewed.     Signed, Rossie Muskrat. Lalla Brothers, MD, [facility], [facility] 10/18/2022 Electrophysiology [facility]

## 2022-10-18 NOTE — Discharge Instructions (Addendum)
After Your ICD (Implantable Cardiac Defibrillator)   You have a Medtronic ICD  ACTIVITY Do not lift your arm above shoulder height for 1 week after your procedure. After 7 days, you may progress as below.  You should remove your sling 24 hours after your procedure, unless otherwise instructed by your provider.     Monday October 25, 2022  Tuesday October 26, 2022 Wednesday October 27, 2022 Thursday October 28, 2022   Do not lift, push, pull, or carry anything over 10 pounds with the affected arm until 6 weeks (Monday November 29, 2022 ) after your procedure.   You may drive AFTER your wound check, unless you have been told otherwise by your provider.   Ask your healthcare provider when you can go back to work   INCISION/Dressing If you are on a blood thinner such as Coumadin, Xarelto, Eliquis, Plavix, or Pradaxa please confirm with your provider when this should be resumed. Resume Asprin 10-19-22  If large square, outer bandage is left in place, this can be removed after 24 hours from your procedure. Do not remove steri-strips or glue as below.   Monitor your defibrillator site for redness, swelling, and drainage. Call the device clinic at [phone] if you experience these symptoms or fever/chills.  If your incision is sealed with Steri-strips or staples, you may shower 7 days after your procedure or when told by your provider. Do not remove the steri-strips or let the shower hit directly on your site. You may wash around your site with soap and water.    If you were discharged in a sling, please do not wear this during the day more than 48 hours after your surgery unless otherwise instructed. This may increase the risk of stiffness and soreness in your shoulder.   Avoid lotions, ointments, or perfumes over your incision until it is well-healed.  You may use a hot tub or a pool AFTER your wound check appointment if the incision is completely closed.  Your ICD is designed to protect you from  life threatening heart rhythms. Because of this, you may receive a shock.   1 shock with no symptoms:  Call the office during business hours. 1 shock with symptoms (chest pain, chest pressure, dizziness, lightheadedness, shortness of breath, overall feeling unwell):  Call 911. If you experience 2 or more shocks in 24 hours:  Call 911. If you receive a shock, you should not drive for 6 months per the [place] DMV IF you receive appropriate therapy from your ICD.   ICD Alerts:  Some alerts are vibratory and others beep. These are NOT emergencies. Please call our office to let us know. If this occurs at night or on weekends, it can wait until the next business day. Send a remote transmission.  If your device is capable of reading fluid status (for heart failure), you will be offered monthly monitoring to review this with you.   DEVICE MANAGEMENT Remote monitoring is used to monitor your ICD from home. This monitoring is scheduled every 91 days by our office. It allows [place] to keep an eye on the functioning of your device to ensure it is working properly. You will routinely see your Electrophysiologist annually (more often if necessary).   You should receive your ID card for your new device in 4-8 weeks. Keep this card with you at all times once received. Consider wearing a medical alert bracelet or necklace.  Your ICD  may be MRI compatible. This will be discussed at  your next office visit/wound check.  You should avoid contact with strong electric or magnetic fields.   Do not use amateur (ham) radio equipment or electric (arc) welding torches. MP3 player headphones with magnets should not be used. Some devices are safe to use if held at least 12 inches (30 cm) from your defibrillator. These include power tools, lawn mowers, and speakers. If you are unsure if something is safe to use, ask your health care provider.  When using your cell phone, hold it to the ear that is on the opposite side from the  defibrillator. Do not leave your cell phone in a pocket over the defibrillator.  You may safely use electric blankets, heating pads, computers, and microwave ovens.  Call the office right away if: You have chest pain. You feel more than one shock. You feel more short of breath than you have felt before. You feel more light-headed than you have felt before. Your incision starts to open up.  This information is not intended to replace advice given to you by your health care provider. Make sure you discuss any questions you have with your health care provider.

## 2022-10-19 ENCOUNTER — Telehealth: Payer: Self-pay

## 2022-10-19 ENCOUNTER — Encounter (HOSPITAL_COMMUNITY): Payer: Self-pay | Admitting: Cardiology

## 2022-10-19 MED FILL — Lidocaine HCl Local Inj 1%: INTRAMUSCULAR | Qty: 50 | Status: AC

## 2022-10-19 NOTE — Telephone Encounter (Signed)
Follow-up after same day discharge: Implant date: 10/18/2022 MD: Dr. Steffanie Dunn Device: Medtronic [id number] Cobalt XT DR MRI Location: Left Chest   Wound check visit: 11/03/2022 @ 2:40 PM 90 day MD follow-up: 02/14/2023 @ 4:00 PM  Remote Transmission received: No, Not in [facility]  Dressing/sling removed: No, forwarding CMA pool for f/u.  Confirm OAC restart on: N/A

## 2022-10-19 NOTE — Telephone Encounter (Signed)
-----   Message from Sheilah Pigeon sent at 10/18/2022  3:14 PM EDT ----- Same day d/c MDT ICD CL No a/c  Renee

## 2022-10-20 NOTE — Telephone Encounter (Signed)
Patient is in [place]. Transmission received. Pt is on a remote scheduled.

## 2022-10-22 ENCOUNTER — Telehealth: Payer: Self-pay

## 2022-10-22 NOTE — Telephone Encounter (Signed)
-----   Message from Sheilah Pigeon sent at 10/22/2022  1:41 PM EDT ----- Same day d/c [facility] 7/15 MDT ICD CL No a/c  renee

## 2022-10-22 NOTE — Telephone Encounter (Signed)
Patient has already been contacted on 10/19/2022. Remotes and appts confirmed. Same day follow up call performed.

## 2022-10-25 ENCOUNTER — Telehealth: Payer: Self-pay | Admitting: Cardiovascular Disease

## 2022-10-25 NOTE — Telephone Encounter (Signed)
Spoke with the patient who thought that arm lifting restrictions were exercises that he needed to be doing for rehab after his ICD implant. I explained the arm restrictions to him and that he did not need to be doing any arm exercises. Patient verbalized understanding.

## 2022-10-25 NOTE — Telephone Encounter (Signed)
Patient stated he is ready to start his rehab exercises and wants to know how many repetition he will need to do for exercises listed on his sheet.

## 2022-11-03 ENCOUNTER — Ambulatory Visit: Payer: Medicare Other | Attending: Internal Medicine

## 2022-11-03 DIAGNOSIS — I509 Heart failure, unspecified: Secondary | ICD-10-CM | POA: Insufficient documentation

## 2022-11-03 LAB — CUP PACEART INCLINIC DEVICE CHECK
Brady Statistic RA Percent Paced: 63.3 %
Brady Statistic RV Percent Paced: 0.1 % — CL
Date Time Interrogation Session: 20240731145916
Implantable Lead Connection Status: 753985
Implantable Lead Connection Status: 753985
Implantable Lead Implant Date: 20240715
Implantable Lead Implant Date: 20240715
Implantable Lead Location: 753859
Implantable Lead Location: 753860
Implantable Lead Model: 5076
Implantable Lead Model: 6935
Implantable Pulse Generator Implant Date: 20240715

## 2022-11-03 NOTE — Progress Notes (Signed)

## 2022-11-03 NOTE — Patient Instructions (Signed)

## 2022-12-21 ENCOUNTER — Ambulatory Visit (HOSPITAL_COMMUNITY): Admit: 2022-12-21 | Payer: Medicare Other | Admitting: Gastroenterology

## 2022-12-21 ENCOUNTER — Encounter (HOSPITAL_COMMUNITY): Payer: Self-pay

## 2022-12-21 SURGERY — COLONOSCOPY WITH PROPOFOL
Anesthesia: Monitor Anesthesia Care

## 2023-01-19 ENCOUNTER — Ambulatory Visit (INDEPENDENT_AMBULATORY_CARE_PROVIDER_SITE_OTHER): Payer: Medicare Other

## 2023-01-19 DIAGNOSIS — I255 Ischemic cardiomyopathy: Secondary | ICD-10-CM

## 2023-01-19 DIAGNOSIS — I509 Heart failure, unspecified: Secondary | ICD-10-CM

## 2023-01-19 LAB — CUP PACEART REMOTE DEVICE CHECK
Battery Remaining Longevity: 136 mo
Battery Voltage: 3.04 V
Brady Statistic RV Percent Paced: 0.05 %
Date Time Interrogation Session: 20241016152020
HighPow Impedance: 60 Ohm
Implantable Lead Connection Status: 753985
Implantable Lead Connection Status: 753985
Implantable Lead Implant Date: 20240715
Implantable Lead Implant Date: 20240715
Implantable Lead Location: 753859
Implantable Lead Location: 753860
Implantable Lead Model: 5076
Implantable Lead Model: 6935
Implantable Pulse Generator Implant Date: 20240715
Lead Channel Impedance Value: 418 Ohm
Lead Channel Impedance Value: 437 Ohm
Lead Channel Impedance Value: 513 Ohm
Lead Channel Pacing Threshold Amplitude: 0.5 V
Lead Channel Pacing Threshold Amplitude: 0.625 V
Lead Channel Pacing Threshold Pulse Width: 0.4 ms
Lead Channel Pacing Threshold Pulse Width: 0.4 ms
Lead Channel Sensing Intrinsic Amplitude: 13.3 mV
Lead Channel Sensing Intrinsic Amplitude: 4.4 mV
Lead Channel Setting Pacing Amplitude: 1.5 V
Lead Channel Setting Pacing Amplitude: 1.5 V
Lead Channel Setting Pacing Pulse Width: 0.4 ms
Lead Channel Setting Sensing Sensitivity: 0.3 mV
Zone Setting Status: 755011
Zone Setting Status: 755011

## 2023-02-07 NOTE — Progress Notes (Unsigned)
CARDIOLOGY CONSULT NOTE       Patient ID: Brent Griffin. MRN: [id number] DOB/AGE: [birth year] 77 y.o.  Referring Physician: Marta Antu Primary Physician: Bailey Mech, PA-C Primary Cardiologist: Eden Emms    HPI:  77 y.o. referred by [name] for CHF/CAD.  First seen 05/10/22 He was admitted at [facility] for fall December. Smoker Abnormal CT with 6 mm nodule ? Volume overload Rx with lasix. CXR with CE small effusions CT suggested multi vessel coronary calcium He had a left rib fracture from fall. Only echo report is not complete Mentions EF 30-50% with dilated RV and MR and LAE this appears to be POC in ER done 03/14/22 Full echo done 08/03/21 showed EF 30-35% with inferior wall motion abnormality and trace MR  Cath done 01/09/21 showed 90% LCX lesion and 50% mid RCA had Xience stent placed 2.75 x 18 mm post dilated to 3.75 mm.  LVEDP normal 11 mmHg Had been seen by Dr Beverely Pace in [facility] with no NSVT Mentions referral to EP for AICD   Smoker. 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 Finished rehab at [facility] GDMT includes Entresto 24/26 mg , Jardiance 10 mg Lasix 40 mg and Toprol 25 mg   Seems to have recovered No angina Volume status ok Had AICD placed by Dr Lalla Brothers 10/18/22 Medtronic DDD ICD   Unsteady on feet and bangs his arms a lot Stopped ASA due to bleeding on arms from bruises Told him with CAD he needs to be back on it. Has cane and walker for stability  ROS All other systems reviewed and negative except as noted above  Past Medical History:  Diagnosis Date   Alzheimer disease (HCC)    Arthritis    thumb right hand   Ascending aorta dilatation (HCC)    CAD (coronary artery disease)    Cancer (HCC) 2020   Prostrate- tx Radiation   Diabetes  mellitus    type II, not taking medications - 03/31/20   DM (diabetes mellitus) (HCC)    DVT (deep venous thrombosis) (HCC)    ED (erectile dysfunction)    Hammer toe    History of kidney stones    x 3   Hyperlipidemia    Hypertension    Hypokalemia    Insomnia    Ischemic dilated cardiomyopathy (HCC)    Knee effusion    Lipid disorder    Malignant neoplasm of prostate (HCC)    Memory loss    Metatarsalgia    left foot   Metatarsalgia, left foot    Mild renal insufficiency    Mobitz type 1 second degree AV block    Muscle spasm of back    PAC (premature atrial contraction)    Pseudogout    Pulmonary nodule    Recurrent falls    Rosacea    Scoliosis    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    Tobacco use disorder    Trimalleolar fracture    Unsteady gait    Urinary hesitancy     Family History  Problem Relation Age of Onset   Colon cancer Other 65   Prostate cancer Father    Diabetes Mellitus II Father    Liver disease Neg Hx  Esophageal cancer Neg Hx    Pancreatic cancer Neg Hx    Stomach cancer Neg Hx     Social History   Socioeconomic History   Marital status: Married    Spouse name: Not on file   Number of children: 0   Years of education: Not on file   Highest education level: Not on file  Occupational History   Not on file  Tobacco Use   Smoking status: Some Days    Types: Cigarettes   Smokeless tobacco: Never   Tobacco comments:    1 a week  Vaping Use   Vaping status: Never Used  Substance and Sexual Activity   Alcohol use: No   Drug use: No   Sexual activity: Not Currently  Other Topics Concern   Not on file  Social History Narrative   Not on file   Social Determinants of Health   Financial Resource Strain: Low Risk  (10/15/2021)   [facility] visits prior to 06/05/2022.   Overall Financial  Resource Strain (CARDIA)    Difficulty of Paying Living Expenses: Not hard at all  Food Insecurity: Low Risk  (04/21/2022)   [facility]   Hunger Vital Sign    Worried About Running Out of Food in the Last Year: Never true    Within the past 12 months, the food you bought just didn't last and you didn't have money to get more: Not on file  Transportation Needs: Not on file (04/21/2022)  Physical Activity: Inactive (10/15/2021)   [facility], [facility] prior to 06/05/2022., [facility] visits prior to 06/05/2022.   Exercise Vital Sign    Days of Exercise per Week: 0 days    Minutes of Exercise per Session: 10 min  Stress: No Stress Concern Present (10/15/2021)   [facility] prior to 06/05/2022[facility] visits prior to 06/05/2022.   [employer] of Occupational Health - Occupational Stress Questionnaire    Feeling of Stress : Only a little  Social Connections: Moderately Integrated (10/15/2021)   [facility] prior to 06/05/2022[facility] visits prior to 06/05/2022.   Social Advertising account executive [NHANES]    Frequency of Communication with Friends and Family: Once a week    Frequency of Social Gatherings with Friends and Family: Twice a week    Attends Religious Services: 1 to 4 times per year    Active Member of [employer] or Organizations: No    Attends Banker Meetings: Never    Marital Status: Married  Catering manager Violence: Low Risk  (03/14/2022)   [facility] prior to [facility]/05/2022., [facility] visits prior to 06/05/2022.   Safety    How often does anyone, including family and friends, physically hurt you?:  Never    How often does anyone, including family and friends, insult or talk down to you?: Never    How often does anyone, including family and friends, threaten you with harm?: Never    How often does anyone, including family and friends, scream or curse at you?: Never    Past Surgical History:  Procedure Laterality  Date   COLONOSCOPY     FOOT FRACTURE SURGERY Bilateral    FOOT SURGERY Left 12/2018   ICD IMPLANT N/A 10/18/2022   Procedure: ICD IMPLANT;  Surgeon: Lanier Prude, MD;  Location: [facility] INVASIVE CV LAB;  Service: Cardiovascular;  Laterality: N/A;   LITHOTRIPSY     MOUTH SURGERY  1990   ORIF ANKLE FRACTURE Right 06/25/2021   Procedure: OPEN TREATMENT OF RIGHT SUBACUTE TRIMALLEOLAR ANKLE FRACTURE;  Surgeon: Terance Hart, MD;  Location: [facility] OR;  Service: Orthopedics;  Laterality: Right;  LENGHT OF SURGERY: 90 MINUTES   ORIF ELBOW FRACTURE Left 04/01/2020   Procedure: OPEN REDUCTION INTERNAL FIXATION (ORIF) SUPRACONDYLAR ELBOW  FRACTURE, OLECRANON OSTEOTOMY AND BURSECTOMY;  Surgeon: Dominica Severin, MD;  Location: [facility] OR;  Service: Orthopedics;  Laterality: Left;   ROTATOR CUFF REPAIR Right 2018   SYNDESMOSIS REPAIR Right 06/25/2021   Procedure: SYNDESMOSIS;  Surgeon: Terance Hart, MD;  [facility] OR;  Service: Orthopedics;  Laterality: Right;   ULNAR NERVE TRANSPOSITION Left 04/01/2020   Procedure: ULNAR NERVE RELEASE AND TRANSPOSITION;  Surgeon: Dominica Severin, MD;  Location: [facility] OR;  Service: Orthopedics;  Laterality: Left;   VASECTOMY  1984      Current Outpatient Medications:    aspirin EC 81 MG tablet, Take 81 mg by mouth in the morning. Swallow whole., Disp: , Rfl:    CINNAMON PO, Take 700 mg by mouth in the morning., Disp: , Rfl:    diphenhydramine-acetaminophen (TYLENOL PM) 25-500 MG TABS tablet, Take 2 tablets by mouth at bedtime as needed (sleep)., Disp: , Rfl:    doxycycline (VIBRA-TABS) 100 MG tablet, Take 100 mg by mouth daily as needed (rosacea  outbreak)., Disp: , Rfl:    empagliflozin (JARDIANCE) 10 MG TABS tablet, Take 1 tablet (10 mg total) by mouth daily., Disp: 90 tablet, Rfl: 3   folic acid (FOLVITE) 1 MG tablet, Take 1 mg by mouth in the morning., Disp: , Rfl:    furosemide (LASIX) 40 MG tablet, Take 1 tablet by mouth daily., Disp: , Rfl:    latanoprost (XALATAN) 0.005 % ophthalmic solution, Place 1 drop into both eyes at bedtime., Disp: , Rfl:    loperamide (IMODIUM) 2 MG capsule, Take 2 mg by mouth in the morning., Disp: , Rfl:    memantine (NAMENDA) 5 MG tablet, Take 5 mg by mouth in the morning., Disp: , Rfl:    metoprolol succinate (TOPROL-XL) 25 MG 24 hr tablet, Take 25 mg by mouth in the morning., Disp: , Rfl:    Multiple Vitamin (MULTI-VITAMINS) TABS, Take 1 tablet by mouth in the morning., Disp: , Rfl:    naproxen (NAPROSYN) 500 MG tablet, Take 500 mg by mouth daily as needed for moderate pain., Disp: , Rfl:    Omega 3 1000 MG CAPS, Take 2,000 mg by mouth in the morning., Disp: , Rfl:    ONETOUCH DELICA LANCETS 33G MISC, USE ONE LANCET DAILY TO TEST BLOOD GLUCOSE, Disp: , Rfl: 3   ONETOUCH VERIO test strip, USE ONE STRIP DAILY TO TEST BLOOD GLUCOSE., Disp: , Rfl: 3   oxyCODONE (OXY IR/ROXICODONE) 5 MG immediate release tablet, Take 5 mg by mouth 2 (two) times daily as needed (pain)., Disp: , Rfl:    potassium chloride SA (KLOR-CON) 20 MEQ tablet, Take 20 mEq by mouth in the morning., Disp: , Rfl:    Probiotic Product (ALIGN PO), Take 1 tablet by mouth in the morning., Disp: , Rfl:    rosuvastatin (CRESTOR)  20 MG tablet, Take 20 mg by mouth at bedtime., Disp: , Rfl:    sacubitril-valsartan (ENTRESTO) 24-26 MG, Take 1 tablet by mouth 2 (two) times daily. (Patient taking differently: Take 1 tablet by mouth in the morning.), Disp: 60 tablet, Rfl: 11   spironolactone (ALDACTONE) 25 MG tablet, Take 1 tablet (25 mg total) by mouth daily., Disp: 90 tablet, Rfl: 3   TURMERIC PO, Take 1 tablet by mouth in the morning., Disp: , Rfl:     vitamin B-12 (CYANOCOBALAMIN) 1000 MCG tablet, Take 1,000 mcg by mouth in the morning., Disp: , Rfl:    vitamin C (ASCORBIC ACID) 250 MG tablet, Take 250 mg by mouth in the morning., Disp: , Rfl:    zinc gluconate 50 MG tablet, Take 50 mg by mouth in the morning., Disp: , Rfl:     Physical Exam: Blood pressure 118/76, pulse 67, height 5\' 10"  (1.778 m), weight 185 lb 12.8 oz (84.3 kg), SpO2 99%.    Affect appropriate Chronically ill male  HEENT: normal Neck supple with no adenopathy JVP normal no bruits no thyromegaly Lungs clear with no wheezing and good diaphragmatic motion Heart:  S1/S2 no murmur, no rub, gallop or click PMI normal AICD under left clavicle  Abdomen: benighn, BS positve, no tenderness, no AAA no bruit.  No HSM or HJR Distal pulses intact with no bruits No edema Neuro non-focal Skin warm and dry No muscular weakness   Labs:   Lab Results  Component Value Date   WBC 5.3 10/13/2022   HGB 14.5 10/13/2022   HCT 44.5 10/13/2022   MCV 94 10/13/2022   PLT 169 10/13/2022   No results for input(s): "NA", "K", "CL", "CO2", "BUN", "CREATININE", "CALCIUM", "PROT", "BILITOT", "ALKPHOS", "ALT", "AST", "GLUCOSE" in the last 168 hours.  Invalid input(s): "LABALBU" Lab Results  Component Value Date   CKTOTAL 401 (H) 04/19/2021   No results found for: "CHOL" No results found for: "HDL" No results found for: "LDLCALC" No results found for: "TRIG" No results found for: "CHOLHDL" No results found for: "LDLDIRECT"    Radiology: CUP PACEART REMOTE DEVICE CHECK  Result Date: 01/19/2023 Scheduled remote reviewed. Normal device function.  Optivol elevated. Next remote 91 days.   EKG: 09/23/21 SR rate 73 nonspecific ST changes   ASSESSMENT AND PLAN:   CAD:  LCX stent 01/09/21 with 50% residual RCA. Continue beta blocker ASA and statin no angina  Ischemic DCM:  EF not clear from notes POCUS admission December 2023  indicates 30-50% Dr Sharalyn Ink note says 25-30% but TTE  from May 2023 says 30-35% On good GDMT Cardiac MRI done 08/31/22 EF 33%  with LCX scar received Medtronic DDD AICD  10/18/22  Smoking:  needs f/u lung cancer CT for 6 mm nodule per primary  Dementia:  continue dementia with age may have some bearing on primary prevention AICD   F/U EP for AICD Lung cancer CT   F/U 6 months  Signed: Charlton Haws 02/09/2023, 9:59 AM

## 2023-02-08 NOTE — Progress Notes (Signed)
Remote ICD transmission.   

## 2023-02-09 ENCOUNTER — Encounter: Payer: Self-pay | Admitting: Cardiovascular Disease

## 2023-02-09 ENCOUNTER — Ambulatory Visit: Payer: Medicare Other | Attending: Cardiovascular Disease | Admitting: Cardiovascular Disease

## 2023-02-09 VITALS — BP 118/76 | HR 67 | Ht 70.0 in | Wt 185.8 lb

## 2023-02-09 DIAGNOSIS — I2510 Atherosclerotic heart disease of native coronary artery without angina pectoris: Secondary | ICD-10-CM | POA: Insufficient documentation

## 2023-02-09 DIAGNOSIS — F17200 Nicotine dependence, unspecified, uncomplicated: Secondary | ICD-10-CM | POA: Insufficient documentation

## 2023-02-09 DIAGNOSIS — R0602 Shortness of breath: Secondary | ICD-10-CM | POA: Diagnosis present

## 2023-02-09 DIAGNOSIS — Z87891 Personal history of nicotine dependence: Secondary | ICD-10-CM | POA: Diagnosis present

## 2023-02-09 DIAGNOSIS — I509 Heart failure, unspecified: Secondary | ICD-10-CM | POA: Insufficient documentation

## 2023-02-09 NOTE — Patient Instructions (Signed)
Medication Instructions:  Your physician has recommended you make the following change in your medication:  1- START Aspirin 81 mg by mouth daily.   *If you need a refill on your cardiac medications before your next appointment, please call your pharmacy*  Lab Work: If you have labs (blood work) drawn today and your tests are completely normal, you will receive your results only by: MyChart Message (if you have MyChart) OR A paper copy in the mail If you have any lab test that is abnormal or we need to change your treatment, we will call you to review the results.  Testing/Procedures: Non-Cardiac CT scanning for lung cancer screening (CAT scanning), is a noninvasive, special x-ray that produces cross-sectional images of the body using x-rays and a computer. CT scans help physicians diagnose and treat medical conditions. For some CT exams, a contrast material is used to enhance visibility in the area of the body being studied. CT scans provide greater clarity and reveal more details than regular x-ray exams.  Follow-Up: At [facility], you and your health needs are our priority.  As part of our continuing mission to provide you with exceptional heart care, we have created designated Provider Care Teams.  These Care Teams include your primary Cardiologist (physician) and Advanced Practice Providers (APPs -  Physician Assistants and Nurse Practitioners) who all work together to provide you with the care you need, when you need it.  We recommend signing up for the patient portal called "MyChart".  Sign up information is provided on this After Visit Summary.  MyChart is used to connect with patients for Virtual Visits (Telemedicine).  Patients are able to view lab/test results, encounter notes, upcoming appointments, etc.  Non-urgent messages can be sent to your provider as well.   To learn more about what you can do with MyChart, go to [email or web address].    Your next appointment:    6 month(s)  Provider:   Charlton Haws, MD

## 2023-02-14 ENCOUNTER — Encounter: Payer: Medicare Other | Admitting: Cardiology

## 2023-02-16 ENCOUNTER — Ambulatory Visit (HOSPITAL_BASED_OUTPATIENT_CLINIC_OR_DEPARTMENT_OTHER)
Admission: RE | Admit: 2023-02-16 | Discharge: 2023-02-16 | Disposition: A | Discharge status: Home / Self Care | Payer: Medicare Other | Source: Ambulatory Visit | Attending: Cardiovascular Disease | Admitting: Cardiovascular Disease

## 2023-02-16 DIAGNOSIS — I2510 Atherosclerotic heart disease of native coronary artery without angina pectoris: Secondary | ICD-10-CM | POA: Diagnosis present

## 2023-02-16 DIAGNOSIS — R0602 Shortness of breath: Secondary | ICD-10-CM | POA: Diagnosis present

## 2023-02-16 DIAGNOSIS — Z87891 Personal history of nicotine dependence: Secondary | ICD-10-CM | POA: Insufficient documentation

## 2023-02-16 DIAGNOSIS — I509 Heart failure, unspecified: Secondary | ICD-10-CM | POA: Insufficient documentation

## 2023-02-16 DIAGNOSIS — F17200 Nicotine dependence, unspecified, uncomplicated: Secondary | ICD-10-CM | POA: Insufficient documentation

## 2023-02-24 NOTE — Progress Notes (Signed)
  Electrophysiology Office Note:   Date:  02/25/2023  ID:  Brent Heal., DOB 1945-11-04, MRN [id number]  Primary Cardiologist: Charlton Haws, MD Electrophysiologist: Lanier Prude, MD      History of Present Illness:   Brent Crepeau. is a 77 y.o. male with h/o chronic systolic CHF s/p ICD, HTN, HLD, CAD s/p PCI, tobacco abuse, DVT, enterobacter bacteremia, DM II, Alzheimer's seen today for routine electrophysiology followup.   Followed by Dr. Lalla Brothers, for chronic systolic CHF. Underwent ICD implant 10/2022. In October 2024, he underwent cystoscopy with transurethral resection of 2.2 cm bladder tumor, instillation of intravesical chemotherapy. 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   Since last being seen in our clinic the patient reports he has recovered well from his surgery. He is hoping to go back to the working out with is therapist. No acute issues from ICD.     He denies chest pain, palpitations, dyspnea, PND, orthopnea, nausea, vomiting, dizziness, syncope, edema, weight gain, or early satiety.   Review of systems complete and found to be negative unless listed in HPI.   EP Information / Studies Reviewed:    EKG is ordered today. Personal review as below.  EKG Interpretation Date/Time:  Friday February 25 2023 10:00:38 EST Ventricular Rate:  68 PR Interval:  158 QRS Duration:  104 QT Interval:  462 QTC Calculation: 491 R Axis:   -2  Text Interpretation: Normal sinus rhythm Nonspecific T wave abnormality Confirmed by Brent Griffin ([id number]) on 02/25/2023 10:06:15 AM   ICD Interrogation-  reviewed in detail today,  See PACEART report.  Device History: Medtronic Dual Chamber ICD implanted 10/18/2022 for sCHF History of appropriate therapy: No History of AAD therapy: No   Studies:  cMRI 08/2022 > LVEF 33%, circumflex territory scar         Physical Exam:   VS:  BP 120/78   Pulse 68   Ht 5\' 10"   (1.778 m)   Wt 183 lb 6.4 oz (83.2 kg)   SpO2 99%   BMI 26.32 kg/m    Wt Readings from Last 3 Encounters:  02/25/23 183 lb 6.4 oz (83.2 kg)  02/09/23 185 lb 12.8 oz (84.3 kg)  10/18/22 177 lb (80.3 kg)     GEN: Well nourished, well developed in no acute distress NECK: No JVD; No carotid bruits CARDIAC: Regular rate and rhythm, no murmurs, rubs, gallops. ICD site well healed, no tethering, edema or erythema RESPIRATORY:  Clear to auscultation without rales, wheezing or rhonchi  ABDOMEN: Soft, non-tender, non-distended EXTREMITIES:  No edema; No deformity   ASSESSMENT AND PLAN:    Chronic Systolic Dysfunction s/p Medtronic dual chamber ICD  NYHA II-III -euvolemic today -Stable on an appropriate medical regimen -Normal ICD function -See Pace Art report -No changes today > thresholds appropriately reduced with auto feature post acute phase implant -continue GDMT per primary  -reviewed exercise with ICD in place  -device checks reviewed with patient / remote monitoring   ICM  CAD  -no anginal symptoms  Disposition:   Follow up with Dr. Lalla Brothers in 12 months   Signed, Brent Brim, MSN, APRN, NP-C, AGACNP-BC [facility] - Electrophysiology  02/25/2023, 10:55 AM

## 2023-02-25 ENCOUNTER — Encounter: Payer: Self-pay | Admitting: Pulmonary Disease

## 2023-02-25 ENCOUNTER — Telehealth: Payer: Self-pay | Admitting: Cardiovascular Disease

## 2023-02-25 ENCOUNTER — Ambulatory Visit: Payer: Medicare Other | Attending: Pulmonary Disease | Admitting: Pulmonary Disease

## 2023-02-25 VITALS — BP 120/78 | HR 68 | Ht 70.0 in | Wt 183.4 lb

## 2023-02-25 DIAGNOSIS — I2510 Atherosclerotic heart disease of native coronary artery without angina pectoris: Secondary | ICD-10-CM | POA: Diagnosis present

## 2023-02-25 DIAGNOSIS — I255 Ischemic cardiomyopathy: Secondary | ICD-10-CM | POA: Insufficient documentation

## 2023-02-25 DIAGNOSIS — Z95810 Presence of automatic (implantable) cardiac defibrillator: Secondary | ICD-10-CM | POA: Insufficient documentation

## 2023-02-25 DIAGNOSIS — I5022 Chronic systolic (congestive) heart failure: Secondary | ICD-10-CM | POA: Diagnosis present

## 2023-02-25 LAB — CUP PACEART INCLINIC DEVICE CHECK
Battery Remaining Longevity: 136 mo
Battery Voltage: 3.03 V
Brady Statistic AP VP Percent: 0.04 %
Brady Statistic AP VS Percent: 61.45 %
Brady Statistic AS VP Percent: 0.01 %
Brady Statistic AS VS Percent: 38.5 %
Brady Statistic RA Percent Paced: 63.45 %
Brady Statistic RV Percent Paced: 0.05 %
Date Time Interrogation Session: 20241122105841
HighPow Impedance: 64 Ohm
Implantable Lead Connection Status: 753985
Implantable Lead Connection Status: 753985
Implantable Lead Implant Date: 20240715
Implantable Lead Implant Date: 20240715
Implantable Lead Location: 753859
Implantable Lead Location: 753860
Implantable Lead Model: 5076
Implantable Lead Model: 6935
Implantable Pulse Generator Implant Date: 20240715
Lead Channel Impedance Value: 399 Ohm
Lead Channel Impedance Value: 456 Ohm
Lead Channel Impedance Value: 532 Ohm
Lead Channel Pacing Threshold Amplitude: 0.5 V
Lead Channel Pacing Threshold Amplitude: 0.5 V
Lead Channel Pacing Threshold Amplitude: 0.5 V
Lead Channel Pacing Threshold Amplitude: 0.75 V
Lead Channel Pacing Threshold Pulse Width: 0.4 ms
Lead Channel Pacing Threshold Pulse Width: 0.4 ms
Lead Channel Pacing Threshold Pulse Width: 0.4 ms
Lead Channel Pacing Threshold Pulse Width: 0.4 ms
Lead Channel Sensing Intrinsic Amplitude: 14.3 mV
Lead Channel Sensing Intrinsic Amplitude: 4 mV
Lead Channel Setting Pacing Amplitude: 1.5 V
Lead Channel Setting Pacing Amplitude: 1.5 V
Lead Channel Setting Pacing Pulse Width: 0.4 ms
Lead Channel Setting Sensing Sensitivity: 0.3 mV
Zone Setting Status: 755011
Zone Setting Status: 755011

## 2023-02-25 NOTE — Telephone Encounter (Signed)
Patient calling about his CT for cancer screening results. Radiology has not read yet. Will see if Dr. Eden Emms can result.

## 2023-02-25 NOTE — Patient Instructions (Signed)
Medication Instructions:  Your physician recommends that you continue on your current medications as directed. Please refer to the Current Medication list given to you today.  *If you need a refill on your cardiac medications before your next appointment, please call your pharmacy*   Lab Work: NONE If you have labs (blood work) drawn today and your tests are completely normal, you will receive your results only by: MyChart Message (if you have MyChart) OR A paper copy in the mail If you have any lab test that is abnormal or we need to change your treatment, we will call you to review the results.   Testing/Procedures: NONE   Follow-Up: At [facility], you and your health needs are our priority.  As part of our continuing mission to provide you with exceptional heart care, we have created designated Provider Care Teams.  These Care Teams include your primary Cardiologist (physician) and Advanced Practice Providers (APPs -  Physician Assistants and Nurse Practitioners) who all work together to provide you with the care you need, when you need it.  We recommend signing up for the patient portal called "MyChart".  Sign up information is provided on this After Visit Summary.  MyChart is used to connect with patients for Virtual Visits (Telemedicine).  Patients are able to view lab/test results, encounter notes, upcoming appointments, etc.  Non-urgent messages can be sent to your provider as well.   To learn more about what you can do with MyChart, go to [email or web address].    Your next appointment:   1 year(s)  Provider:   You may see Lanier Prude, MD  Francis Dowse, PA-C Casimiro Needle [address] [place], PA-C Sherie Don, NP Canary Brim, NP

## 2023-02-25 NOTE — Telephone Encounter (Signed)
patient is calling because he said he never received his results from test on 11/13

## 2023-04-01 ENCOUNTER — Other Ambulatory Visit: Payer: Self-pay

## 2023-04-01 ENCOUNTER — Emergency Department (HOSPITAL_BASED_OUTPATIENT_CLINIC_OR_DEPARTMENT_OTHER)
Admission: EM | Admit: 2023-04-01 | Discharge: 2023-04-01 | Disposition: A | Discharge status: Home / Self Care | Payer: Medicare Other | Attending: Emergency Medicine | Admitting: Emergency Medicine

## 2023-04-01 ENCOUNTER — Encounter (HOSPITAL_BASED_OUTPATIENT_CLINIC_OR_DEPARTMENT_OTHER): Payer: Self-pay

## 2023-04-01 DIAGNOSIS — Z79899 Other long term (current) drug therapy: Secondary | ICD-10-CM | POA: Diagnosis not present

## 2023-04-01 DIAGNOSIS — I10 Essential (primary) hypertension: Secondary | ICD-10-CM | POA: Insufficient documentation

## 2023-04-01 DIAGNOSIS — Y9301 Activity, walking, marching and hiking: Secondary | ICD-10-CM | POA: Diagnosis not present

## 2023-04-01 DIAGNOSIS — Z7984 Long term (current) use of oral hypoglycemic drugs: Secondary | ICD-10-CM | POA: Diagnosis not present

## 2023-04-01 DIAGNOSIS — S60811A Abrasion of right wrist, initial encounter: Secondary | ICD-10-CM | POA: Diagnosis not present

## 2023-04-01 DIAGNOSIS — S50812A Abrasion of left forearm, initial encounter: Secondary | ICD-10-CM | POA: Diagnosis not present

## 2023-04-01 DIAGNOSIS — T148XXA Other injury of unspecified body region, initial encounter: Secondary | ICD-10-CM

## 2023-04-01 DIAGNOSIS — S59902A Unspecified injury of left elbow, initial encounter: Secondary | ICD-10-CM | POA: Diagnosis present

## 2023-04-01 DIAGNOSIS — Z7982 Long term (current) use of aspirin: Secondary | ICD-10-CM | POA: Diagnosis not present

## 2023-04-01 DIAGNOSIS — Z8546 Personal history of malignant neoplasm of prostate: Secondary | ICD-10-CM | POA: Insufficient documentation

## 2023-04-01 DIAGNOSIS — W108XXA Fall (on) (from) other stairs and steps, initial encounter: Secondary | ICD-10-CM | POA: Insufficient documentation

## 2023-04-01 DIAGNOSIS — S51012A Laceration without foreign body of left elbow, initial encounter: Secondary | ICD-10-CM | POA: Insufficient documentation

## 2023-04-01 DIAGNOSIS — E119 Type 2 diabetes mellitus without complications: Secondary | ICD-10-CM | POA: Insufficient documentation

## 2023-04-01 MED ORDER — SILVER NITRATE-POT NITRATE 75-25 % EX MISC
1.0000 | Freq: Once | CUTANEOUS | Status: AC
  Administered 2023-04-01: 1 via TOPICAL
  Filled 2023-04-01: qty 10

## 2023-04-01 NOTE — ED Notes (Signed)

## 2023-04-01 NOTE — ED Notes (Signed)
Fall risk armband Fall risk sign on door Patient wearing shoes

## 2023-04-01 NOTE — ED Triage Notes (Signed)
Pt tripped and fell landing on his left elbow and arm. Pt is on Plavix.

## 2023-04-01 NOTE — ED Provider Notes (Signed)
[facility] Provider Note   CSN: [id number] Arrival date & time: 04/01/23  1304     History  Chief Complaint  Patient presents with   Fall   Laceration    Mars Burczyk. is a 77 y.o. male hypertension hyperlipidemia prostate cancer diabetes, DVT, unsteady gait. Mechanical fall walking up the stairs, missed a step falling forward and landing on left forearm, catching himself with both hands. Now has large bleeding abrasion, not well controlled with pressure. Patient is on Plavix.  Patient did not hit head, no headache no loss of consciousness or confusion.  No neck pain.  Denies any other injuries.  Moving extremities without difficulty.  Able to ambulate.   Fall  Laceration      Home Medications Prior to Admission medications   Medication Sig Start Date End Date Taking? Authorizing Provider  aspirin EC 81 MG tablet Take 81 mg by mouth in the morning. Swallow whole.    [provider]  CINNAMON PO Take 700 mg by mouth in the morning.    [provider]  diphenhydramine-acetaminophen (TYLENOL PM) 25-500 MG TABS tablet Take 2 tablets by mouth at bedtime as needed (sleep).    [provider]  doxycycline (VIBRA-TABS) 100 MG tablet Take 100 mg by mouth daily as needed (rosacea outbreak). 01/19/16   [provider]  empagliflozin (JARDIANCE) 10 MG TABS tablet Take 1 tablet (10 mg total) by mouth daily. 05/10/22   Wendall Stade, MD  folic acid (FOLVITE) 1 MG tablet Take 1 mg by mouth in the morning.    [provider]  furosemide (LASIX) 40 MG tablet Take 1 tablet by mouth daily. 04/01/22   [provider]  latanoprost (XALATAN) 0.005 % ophthalmic solution Place 1 drop into both eyes at bedtime. 07/20/21   [provider]  loperamide (IMODIUM) 2 MG capsule Take 2 mg by mouth in the morning.    [provider]  memantine (NAMENDA) 5 MG tablet Take 5 mg by mouth in  the morning.    [provider]  metoprolol succinate (TOPROL-XL) 25 MG 24 hr tablet Take 25 mg by mouth in the morning. 04/01/22   [provider]  Multiple Vitamin (MULTI-VITAMINS) TABS Take 1 tablet by mouth in the morning.    [provider]  naproxen (NAPROSYN) 500 MG tablet Take 500 mg by mouth daily as needed for moderate pain. 12/03/15   [provider]  Omega 3 1000 MG CAPS Take 2,000 mg by mouth in the morning.    [provider]  Dola Argyle LANCETS 33G MISC USE ONE LANCET DAILY TO TEST BLOOD GLUCOSE 05/16/16   [provider]  ONETOUCH VERIO test strip USE ONE STRIP DAILY TO TEST BLOOD GLUCOSE. 05/15/16   [provider]  oxyCODONE (OXY IR/ROXICODONE) 5 MG immediate release tablet Take 5 mg by mouth 2 (two) times daily as needed (pain). 03/18/22   [provider]  potassium chloride SA (KLOR-CON) 20 MEQ tablet Take 20 mEq by mouth in the morning.    [provider]  Probiotic Product (ALIGN PO) Take 1 tablet by mouth in the morning.    [provider]  rosuvastatin (CRESTOR) 20 MG tablet Take 20 mg by mouth at bedtime.    [provider]  sacubitril-valsartan (ENTRESTO) 24-26 MG Take 1 tablet by mouth 2 (two) times daily. Patient taking differently: Take 1 tablet by mouth in the morning. 05/18/22  Wendall Stade, MD  spironolactone (ALDACTONE) 25 MG tablet Take 1 tablet (25 mg total) by mouth daily. 05/11/22   Wendall Stade, MD  TURMERIC PO Take 1 tablet by mouth in the morning.    [provider]  vitamin B-12 (CYANOCOBALAMIN) 1000 MCG tablet Take 1,000 mcg by mouth in the morning.    [provider]  vitamin C (ASCORBIC ACID) 250 MG tablet Take 250 mg by mouth in the morning.    [provider]  zinc gluconate 50 MG tablet Take 50 mg by mouth in the morning.    [provider]      Allergies    Donepezil    Review of Systems   Review of  Systems  Physical Exam Updated Vital Signs BP 97/63 (BP Location: Right Arm)   Pulse 65   Temp 98.6 F (37 C) (Oral)   Resp 18   SpO2 96%  Physical Exam Vitals and nursing note reviewed.  Constitutional:      General: He is not in acute distress.    Appearance: He is not toxic-appearing.  HENT:     Head: Normocephalic and atraumatic.  Eyes:     General: No scleral icterus.    Conjunctiva/sclera: Conjunctivae normal.  Neck:     Comments: No midline tenderness. No neck pain.  Cardiovascular:     Rate and Rhythm: Normal rate and regular rhythm.     Pulses: Normal pulses.     Heart sounds: Normal heart sounds.  Pulmonary:     Effort: Pulmonary effort is normal. No respiratory distress.     Breath sounds: Normal breath sounds.  Abdominal:     General: Abdomen is flat. Bowel sounds are normal.     Palpations: Abdomen is soft.     Tenderness: There is no abdominal tenderness.  Musculoskeletal:     Cervical back: No rigidity or tenderness.     Comments: Focal area of tenderness over the bones.  Moving all extremities without difficulty.  Normal grip strength strong radial pulse bilaterally.  Skin:    General: Skin is warm and dry.     Capillary Refill: Capillary refill takes less than 2 seconds.     Findings: No lesion.     Comments: Skin.  Large abrasion over left lateral forearm just distal to the elbow, abrasion over elbow.  With a very superficial skin tears actively bleeding. Small abrasion over right palmar aspect of wrist, not actively bleeding.   Neurological:     General: No focal deficit present.     Mental Status: He is alert and oriented to person, place, and time. Mental status is at baseline.     Cranial Nerves: No cranial nerve deficit.     Sensory: No sensory deficit.     Motor: No weakness.     Coordination: Coordination normal.     Gait: Gait normal.     Comments: A&O, questions appropriately no slurred speech.     ED Results / Procedures / Treatments    Labs (all labs ordered are listed, but only abnormal results are displayed) Labs Reviewed - No data to display  EKG None  Radiology No results found.  Procedures Procedures    Medications Ordered in ED Medications - No data to display  ED Course/ Medical Decision Making/ A&P  Medical Decision Making Risk Prescription drug management.   This patient presents to the ED for concern of fall, this involves an extensive number of treatment options, and is a complaint that carries with it a high risk of complications and morbidity.  The differential diagnosis includes abrasion, laceration, fracture, sprain, LOC    Co morbidities that complicate the patient evaluation  hypertension hyperlipidemia prostate cancer diabetes, DVT, unsteady gait.   Problem List / ED Course / Critical interventions / Medication management  Patient reporting to emergency room after fall.  Patient is on Plavix.  When he fell did not hit his head no loss of consciousness no neck pain.  Reassuring physical exam.  Patient able to move extremities without difficult.  No Point tenderness over elbow.  I used silver nitrate to cauterize area bleeding, bleeding stopped as well with direct pressure.  Prior to discharge covered area with nonadhesive pad and dressing.  Patient family member requesting discharge.  Patient is hemodynamically stable well-appearing, all questions answered, pain controlled, bleeding controlled.  I have reviewed the patients home medicines and have made adjustments as needed   Plan  F/u w/ PCP in 2-3d to ensure resolution of sx.  Patient was given return precautions. Patient stable for discharge at this time.  Patient educated on sx/dx and verbalized understanding of plan. Return to ER w/ new or worsening sx.          Final Clinical Impression(s) / ED Diagnoses Final diagnoses:  Abrasion    Rx / DC Orders ED Discharge Orders     None          Smitty Knudsen, PA-C 04/01/23 1557

## 2023-04-01 NOTE — Discharge Instructions (Signed)
You were seen in the emergency room today after fall.  Please keep direct pressure over the abrasion if bleeding restarts.  Otherwise keep area clean dry and covered.  You can use Neosporin or bacitracin on the area twice a day.  Use nonadherent pad to cover the abrasion.  You can use gentle soap and water to clean the area.  Please follow-up with primary care or return to emergency room if you have signs of poor wound healing or infection.

## 2023-04-14 ENCOUNTER — Telehealth: Payer: Self-pay | Admitting: Cardiovascular Disease

## 2023-04-14 NOTE — Telephone Encounter (Signed)
 Left message for patient to call back

## 2023-04-14 NOTE — Telephone Encounter (Signed)
 Pt c/o medication issue:  1. Name of Medication:  furosemide (LASIX) 40 MG tablet  2. How are you currently taking this medication (dosage and times per day)?   3. Are you having a reaction (difficulty breathing--STAT)?   4. What is your medication issue?   Patient states he recently had an accident and injured his left arm. He would like to know if he needs to hold Lasix. Please advise.

## 2023-04-14 NOTE — Telephone Encounter (Signed)
 Patient called back. Informed him that he is fine taking lasix. Informed him of bleeding precautions with Aspirin and his wound.

## 2023-04-14 NOTE — Telephone Encounter (Signed)
Patient is returning call. Transferred to [facility], RN.

## 2023-04-20 ENCOUNTER — Ambulatory Visit (INDEPENDENT_AMBULATORY_CARE_PROVIDER_SITE_OTHER): Payer: Medicare Other

## 2023-04-20 DIAGNOSIS — I255 Ischemic cardiomyopathy: Secondary | ICD-10-CM

## 2023-04-20 LAB — CUP PACEART REMOTE DEVICE CHECK
Battery Remaining Longevity: 135 mo
Battery Voltage: 3.02 V
Brady Statistic RV Percent Paced: 0.06 %
Date Time Interrogation Session: 20250115102245
HighPow Impedance: 56 Ohm
Implantable Lead Connection Status: 753985
Implantable Lead Connection Status: 753985
Implantable Lead Implant Date: 20240715
Implantable Lead Implant Date: 20240715
Implantable Lead Location: 753859
Implantable Lead Location: 753860
Implantable Lead Model: 5076
Implantable Lead Model: 6935
Implantable Pulse Generator Implant Date: 20240715
Lead Channel Impedance Value: 418 Ohm
Lead Channel Impedance Value: 475 Ohm
Lead Channel Impedance Value: 513 Ohm
Lead Channel Pacing Threshold Amplitude: 0.625 V
Lead Channel Pacing Threshold Amplitude: 0.75 V
Lead Channel Pacing Threshold Pulse Width: 0.4 ms
Lead Channel Pacing Threshold Pulse Width: 0.4 ms
Lead Channel Sensing Intrinsic Amplitude: 11 mV
Lead Channel Sensing Intrinsic Amplitude: 5.3 mV
Lead Channel Setting Pacing Amplitude: 1.5 V
Lead Channel Setting Pacing Amplitude: 1.5 V
Lead Channel Setting Pacing Pulse Width: 0.4 ms
Lead Channel Setting Sensing Sensitivity: 0.3 mV
Zone Setting Status: 755011
Zone Setting Status: 755011

## 2023-04-24 ENCOUNTER — Encounter: Payer: Self-pay | Admitting: Cardiology

## 2023-04-26 ENCOUNTER — Telehealth: Payer: Self-pay

## 2023-04-26 NOTE — Telephone Encounter (Signed)
Device alert for Optivol elevation.   Spoke with patient.  He denies any symptoms. No missed doses of medications and is taking his Furosemide, 1 pill daily. States he continues to have frequent urination and is not interested in taking any extra diuretic.   He will monitor for any signs of fluid retention and/or SOB with exertion as well as limit his sodium intake.  He has our direct number in device clinic if he notices any unwanted changes.

## 2023-04-27 ENCOUNTER — Telehealth: Payer: Self-pay | Admitting: *Deleted

## 2023-04-27 NOTE — Telephone Encounter (Signed)
   [facility] Pre-operative Risk Assessment    Patient Name: Brent Griffin.  DOB: Jun 04, 1945 MRN: [id number]  HEARTCARE STAFF:  - IMPORTANT!!!!!! Under Visit Info/Reason for Call, type in Other and utilize the format Clearance MM/DD/YY or Clearance TBD. Do not use dashes or single digits. - Please review there is not already an duplicate clearance open for this procedure. - If request is for dental extraction, please clarify the # of teeth to be extracted. - If the patient is currently at the dentist's office, call Pre-Op Callback Staff (MA/nurse) to input urgent request.  - If the patient is not currently in the dentist office, please route to the Pre-Op pool.  Request for surgical clearance:  What type of surgery is being performed? Cystoscopy, transurethral resection of bladder tumor  When is this surgery scheduled? 05/16/23  What type of clearance is required (medical clearance vs. Pharmacy clearance to hold med vs. Both)? Both  Are there any medications that need to be held prior to surgery and how long? Plavix per form, per our list pt on only [address]  Practice name and name of physician performing surgery? [facility], Dr Sherryl Barters  What is the office phone number? [phone]   7.   What is the office fax number? [phone]  8.   Anesthesia type (None, local, MAC, general) ? Not indicated   Valrie Hart 04/27/2023, 4:28 PM  _________________________________________________________________   (provider comments below)

## 2023-04-27 NOTE — Telephone Encounter (Signed)
   Name: Brent Griffin.  DOB: 1946-02-16  MRN: [id number]  Primary Cardiologist: Charlton Haws, MD   Preoperative team, please contact this patient and set up a phone call appointment for further preoperative risk assessment. Please obtain consent and complete medication review. Thank you for your help.  I confirm that guidance regarding antiplatelet and oral anticoagulation therapy has been completed and, if necessary, noted below.  Okay to hold Plavix x 5 days prior to procedure and restart when medically safe to do so. Would prefer to continue ASA throughout.   I also confirmed the patient resides in the state of [place]. As per [facility] telemedicine laws, the patient must reside in the state in which the provider is licensed.   Napoleon Form, Leodis Rains, NP 04/27/2023, 4:56 PM [facility]

## 2023-04-27 NOTE — Telephone Encounter (Signed)
Left message to call back to schedule tele pre op appt.  

## 2023-04-29 ENCOUNTER — Telehealth: Payer: Self-pay

## 2023-04-29 NOTE — Telephone Encounter (Signed)
Patient has been scheduled was ok to use provider slot approved by APP Bernadene Person, NP  and consent done     Patient Consent for Virtual Visit         Brent Griffin. has provided verbal consent on 04/29/2023 for a virtual visit (video or telephone).   CONSENT FOR VIRTUAL VISIT FOR:  Brent Griffin.  By participating in this virtual visit I agree to the following:  I hereby voluntarily request, consent and authorize [facility] and its employed or contracted physicians, physician assistants, nurse practitioners or other licensed health care professionals (the Practitioner), to provide me with telemedicine health care services (the "Services") as deemed necessary by the treating Practitioner. I acknowledge and consent to receive the Services by the Practitioner via telemedicine. I understand that the telemedicine visit will involve communicating with the Practitioner through live audiovisual communication technology and the disclosure of certain medical information by electronic transmission. I acknowledge that I have been given the opportunity to request an in-person assessment or other available alternative prior to the telemedicine visit and am voluntarily participating in the telemedicine visit.  I understand that I have the right to withhold or withdraw my consent to the use of telemedicine in the course of my care at any time, without affecting my right to future care or treatment, and that the Practitioner or I may terminate the telemedicine visit at any time. I understand that I have the right to inspect all information obtained and/or recorded in the course of the telemedicine visit and may receive copies of available information for a reasonable fee.  I understand that some of the potential risks of receiving the Services via telemedicine include:  Delay or interruption in medical evaluation due to technological equipment failure or disruption; Information transmitted  may not be sufficient (e.g. poor resolution of images) to allow for appropriate medical decision making by the Practitioner; and/or  In rare instances, security protocols could fail, causing a breach of personal health information.  Furthermore, I acknowledge that it is my responsibility to provide information about my medical history, conditions and care that is complete and accurate to the best of my ability. I acknowledge that Practitioner's advice, recommendations, and/or decision may be based on factors not within their control, such as incomplete or inaccurate data provided by me or distortions of diagnostic images or specimens that may result from electronic transmissions. I understand that the practice of medicine is not an exact science and that Practitioner makes no warranties or guarantees regarding treatment outcomes. I acknowledge that a copy of this consent can be made available to me via my patient portal [facility] MyChart), or I can request a printed copy by calling the office of [facility].    I understand that my insurance will be billed for this visit.   I have read or had this consent read to me. I understand the contents of this consent, which adequately explains the benefits and risks of the Services being provided via telemedicine.  I have been provided ample opportunity to ask questions regarding this consent and the Services and have had my questions answered to my satisfaction. I give my informed consent for the services to be provided through the use of telemedicine in my medical care

## 2023-04-29 NOTE — Telephone Encounter (Signed)
Patient has been scheduled was ok to use provider slot approved by APP Bernadene Person, NP

## 2023-05-11 ENCOUNTER — Ambulatory Visit: Payer: Medicare Other | Attending: Cardiology | Admitting: Cardiology

## 2023-05-11 DIAGNOSIS — Z01810 Encounter for preprocedural cardiovascular examination: Secondary | ICD-10-CM

## 2023-05-11 NOTE — Progress Notes (Signed)
 Virtual Visit via Telephone Note   Because of Brent HARTLINE Jr.'s co-morbid illnesses, he is at least at moderate risk for complications without adequate follow up.  This format is felt to be most appropriate for this patient at this time.  The patient did not have access to video technology/had technical difficulties with video requiring transitioning to audio format only (telephone).  All issues noted in this document were discussed and addressed.  No physical exam could be performed with this format.  Please refer to the patient's chart for his consent to telehealth for [facility]  Evaluation Performed:  Preoperative cardiovascular risk assessment _____________   Date:  05/11/2023   Patient ID:  Brent JONETTA Ellen Mickey., DOB 1945-10-14, MRN [id number] Patient Location:  Home Provider location:   Office  Primary Care Provider:  Podraza, Cole Christopher, PA-C Primary Cardiologist:  Maude Emmer, MD  Chief Complaint / Patient Profile  78 y.o. y/o male with a h/o chronic systolic CHF s/p ICD, hypertension, hyperlipidemia, CAD s/p PCI, tobacco abuse, DVT, type 2 diabetes mellitus, Alzheimer's who is pending cystoscopy with transurethral resection of bladder tumor with Dr. Chauncey and presents today for telephonic preoperative cardiovascular risk assessment. History of Present Illness   Brent Habermann. is a 78 y.o. male who presents via audio/video conferencing for a telehealth visit today.  Pt was last seen in cardiology clinic on 02/25/23 by Daphne Barrack, NP.  At that time Brent Leach. was doing well.  The patient is now pending procedure as outlined above. Since his last visit, he has remained stable from a cardiac standpoint.  Today he denies chest pain, shortness of breath, lower extremity edema, fatigue, palpitations, melena, hematuria, hemoptysis, diaphoresis, weakness, presyncope, syncope, orthopnea, and PND.  Past Medical History    Past Medical History:   Diagnosis Date   Alzheimer disease (HCC)    Arthritis    thumb right hand   Ascending aorta dilatation (HCC)    CAD (coronary artery disease)    Cancer (HCC) 2020   Prostrate- tx Radiation   Diabetes mellitus    type II, not taking medications - 03/31/20   DM (diabetes mellitus) (HCC)    DVT (deep venous thrombosis) (HCC)    ED (erectile dysfunction)    Hammer toe    History of kidney stones    x 3   Hyperlipidemia    Hypertension    Hypokalemia    Insomnia    Ischemic dilated cardiomyopathy (HCC)    Knee effusion    Lipid disorder    Malignant neoplasm of prostate (HCC)    Memory loss    Metatarsalgia    left foot   Metatarsalgia, left foot    Mild renal insufficiency    Mobitz type 1 second degree AV block    Muscle spasm of back    PAC (premature atrial contraction)    Pseudogout    Pulmonary nodule    Recurrent falls    Rosacea    Scoliosis    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    Tobacco use disorder    Trimalleolar fracture    Unsteady gait    Urinary hesitancy    Past Surgical History:  Procedure Laterality Date   COLONOSCOPY     FOOT FRACTURE SURGERY Bilateral    FOOT SURGERY Left 12/2018   ICD IMPLANT N/A 10/18/2022   Procedure: ICD IMPLANT;  Surgeon: Cindie Ole DASEN, MD;  Location[facility] INVASIVE CV  LAB;  Service: Cardiovascular;  Laterality: N/A;   LITHOTRIPSY     MOUTH SURGERY  1990   ORIF ANKLE FRACTURE Right 06/25/2021   Procedure: OPEN TREATMENT OF RIGHT SUBACUTE TRIMALLEOLAR ANKLE FRACTURE;  Surgeon: Elsa Lonni SAUNDERS, MD;  Location: [facility] OR;  Service: Orthopedics;  Laterality: Right;  LENGHT OF SURGERY: 90 MINUTES   ORIF ELBOW FRACTURE Left 04/01/2020   Procedure: OPEN REDUCTION INTERNAL FIXATION (ORIF) SUPRACONDYLAR ELBOW  FRACTURE, OLECRANON OSTEOTOMY AND BURSECTOMY;  Surgeon: Camella Fallow, MD;  Location: [facility] OR;  Service: Orthopedics;  Laterality: Left;   ROTATOR CUFF REPAIR Right 2018   SYNDESMOSIS REPAIR Right  06/25/2021   Procedure: SYNDESMOSIS;  Surgeon: Elsa Lonni SAUNDERS, MD;  Location: [facility] OR;  Service: Orthopedics;  Laterality: Right;   ULNAR NERVE TRANSPOSITION Left 04/01/2020   Procedure: ULNAR NERVE RELEASE AND TRANSPOSITION;  Surgeon: Camella Fallow, MD;  Location: [facility] OR;  Service: Orthopedics;  Laterality: Left;   VASECTOMY  1984   Allergies Allergies  Allergen Reactions   Donepezil Other (See Comments)    Felt weird.     Home Medications    Prior to Admission medications   Medication Sig Start Date End Date Taking? Authorizing Provider  aspirin  EC 81 MG tablet Take 81 mg by mouth in the morning. Swallow whole.    [provider]  CINNAMON PO Take 700 mg by mouth in the morning.    [provider]  diphenhydramine-acetaminophen  (TYLENOL  PM) 25-500 MG TABS tablet Take 2 tablets by mouth at bedtime as needed (sleep).    [provider]  doxycycline (VIBRA-TABS) 100 MG tablet Take 100 mg by mouth daily as needed (rosacea outbreak). 01/19/16   [provider]  empagliflozin  (JARDIANCE ) 10 MG TABS tablet Take 1 tablet (10 mg total) by mouth daily. 05/10/22   Nishan, Peter C, MD  folic acid (FOLVITE) 1 MG tablet Take 1 mg by mouth in the morning.    [provider]  furosemide (LASIX) 40 MG tablet Take 1 tablet by mouth daily. 04/01/22   [provider]  latanoprost (XALATAN) 0.005 % ophthalmic solution Place 1 drop into both eyes at bedtime. 07/20/21   [provider]  loperamide (IMODIUM) 2 MG capsule Take 2 mg by mouth in the morning.    [provider]  memantine  (NAMENDA ) 5 MG tablet Take 5 mg by mouth in the morning.    [provider]  metoprolol  succinate (TOPROL -XL) 25 MG 24 hr tablet Take 25 mg by mouth in the morning. 04/01/22   [provider]  Multiple Vitamin (MULTI-VITAMINS) TABS Take 1 tablet by mouth in the morning.    [provider]  naproxen (NAPROSYN) 500 MG tablet Take  500 mg by mouth daily as needed for moderate pain. 12/03/15   [provider]  Omega 3 1000 MG CAPS Take 2,000 mg by mouth in the morning.    [provider]  AISHA PASTOR LANCETS 33G MISC USE ONE LANCET DAILY TO TEST BLOOD GLUCOSE 05/16/16   [provider]  ONETOUCH VERIO test strip USE ONE STRIP DAILY TO TEST BLOOD GLUCOSE. 05/15/16   [provider]  oxyCODONE  (OXY IR/ROXICODONE ) 5 MG immediate release tablet Take 5 mg by mouth 2 (two) times daily as needed (pain). 03/18/22   [provider]  potassium chloride  SA (KLOR-CON ) 20 MEQ tablet Take 20 mEq by mouth in the morning.    [provider]  Probiotic Product (ALIGN PO) Take 1 tablet by mouth in  the morning.    [provider]  rosuvastatin  (CRESTOR ) 20 MG tablet Take 20 mg by mouth at bedtime.    [provider]  sacubitril-valsartan (ENTRESTO ) 24-26 MG Take 1 tablet by mouth 2 (two) times daily. Patient taking differently: Take 1 tablet by mouth in the morning. 05/18/22   Nishan, Peter C, MD  spironolactone  (ALDACTONE ) 25 MG tablet Take 1 tablet (25 mg total) by mouth daily. 05/11/22   Nishan, Peter C, MD  TURMERIC PO Take 1 tablet by mouth in the morning.    [provider]  vitamin B-12 (CYANOCOBALAMIN ) 1000 MCG tablet Take 1,000 mcg by mouth in the morning.    [provider]  vitamin C (ASCORBIC ACID ) 250 MG tablet Take 250 mg by mouth in the morning.    [provider]  zinc gluconate 50 MG tablet Take 50 mg by mouth in the morning.    [provider]    Physical Exam    Vital Signs:  Brent Geibel. does not have vital signs available for review today. Given telephonic nature of communication, physical exam is limited. AAOx3. NAD. Normal affect.  Speech and respirations are unlabored. Accessory Clinical Findings    None Assessment & Plan    1.  Preoperative Cardiovascular Risk Assessment: Cystoscopy with  transurethral resection of bladder tumor with Dr. Chauncey.   Brent Griffin's perioperative risk of a major cardiac event is 6.6% according to the Revised Cardiac Risk Index (RCRI).  Therefore, he is at high risk for perioperative complications.   His functional capacity is fair at 5.07 METs according to the Duke Activity Status Index (DASI). Recommendations: According to ACC/AHA guidelines, no further cardiovascular testing needed.  The patient may proceed to surgery at acceptable risk.   Antiplatelet and/or Anticoagulation Recommendations: Patient is no longer listed as taking Plavix . Ideally aspirin  81 mg daily would be continued throughout the perioperative period however if surgeon feels that bleeding risk is too high it may be held for 5 to 7 days prior to procedure.  Please resume when safe to do so from a bleeding standpoint.   The patient was advised that if he develops new symptoms prior to surgery to contact our office to arrange for a follow-up visit, and he verbalized understanding.  A copy of this note will be routed to requesting surgeon.  Time:   Today, I have spent 10 minutes with the patient with telehealth technology discussing medical history, symptoms, and management plan.    Alveena Taira D Annick Dimaio, NP  05/11/2023, 3:48 PM

## 2023-05-31 NOTE — Progress Notes (Signed)
 Remote ICD transmission.

## 2023-06-10 ENCOUNTER — Other Ambulatory Visit: Payer: Self-pay | Admitting: Cardiovascular Disease

## 2023-07-20 ENCOUNTER — Ambulatory Visit (INDEPENDENT_AMBULATORY_CARE_PROVIDER_SITE_OTHER): Payer: Medicare Other

## 2023-07-20 DIAGNOSIS — I255 Ischemic cardiomyopathy: Secondary | ICD-10-CM

## 2023-07-21 LAB — CUP PACEART REMOTE DEVICE CHECK
Battery Remaining Longevity: 132 mo
Battery Voltage: 3.02 V
Brady Statistic RV Percent Paced: 0.11 %
Date Time Interrogation Session: 20250416090344
HighPow Impedance: 62 Ohm
Implantable Lead Connection Status: 753985
Implantable Lead Connection Status: 753985
Implantable Lead Implant Date: 20240715
Implantable Lead Implant Date: 20240715
Implantable Lead Location: 753859
Implantable Lead Location: 753860
Implantable Lead Model: 5076
Implantable Lead Model: 6935
Implantable Pulse Generator Implant Date: 20240715
Lead Channel Impedance Value: 437 Ohm
Lead Channel Impedance Value: 456 Ohm
Lead Channel Impedance Value: 551 Ohm
Lead Channel Pacing Threshold Amplitude: 0.5 V
Lead Channel Pacing Threshold Amplitude: 0.75 V
Lead Channel Pacing Threshold Pulse Width: 0.4 ms
Lead Channel Pacing Threshold Pulse Width: 0.4 ms
Lead Channel Sensing Intrinsic Amplitude: 12.3 mV
Lead Channel Sensing Intrinsic Amplitude: 5.1 mV
Lead Channel Setting Pacing Amplitude: 1.5 V
Lead Channel Setting Pacing Amplitude: 1.5 V
Lead Channel Setting Pacing Pulse Width: 0.4 ms
Lead Channel Setting Sensing Sensitivity: 0.3 mV
Zone Setting Status: 755011
Zone Setting Status: 755011

## 2023-07-26 ENCOUNTER — Encounter: Payer: Self-pay | Admitting: Cardiology

## 2023-08-19 NOTE — Progress Notes (Deleted)
 CARDIOLOGY CONSULT NOTE       Patient ID: Brent Griffin. MRN: [id number] DOB/AGE: 08/11/1945 78 y.o.  Referring Physician: Amelia Jurist Primary Physician: Podraza, Cole Christopher, PA-C Primary Cardiologist: Stann Earnest    HPI:  78 y.o. referred by [name] for CHF/CAD.  First seen 05/10/22 He was admitted at [facility] for fall December. Smoker Abnormal CT with 6 mm nodule ? Volume overload Rx with lasix. CXR with CE small effusions CT suggested multi vessel coronary calcium  He had a left rib fracture from fall. Only echo report is not complete Mentions EF 30-50% with dilated RV and MR and LAE this appears to be POC in ER done 03/14/22 Full echo done 08/03/21 showed EF 30-35% with inferior wall motion abnormality and trace MR  Cath done 01/09/21 showed 90% LCX lesion and 50% mid RCA had Xience stent placed 2.75 x 18 mm post dilated to 3.75 mm.  LVEDP normal 11 mmHg Had been seen by Dr Murray Arnold in [facility] with no NSVT Mentions referral to EP for AICD   Smoker. HTN, HLD GDMT for CHF limited by BP  Significant cognitive defect on Namenda  Intolerant to Aricept   Retired from [employer] to go to [employer] with wife of 26 years   Had 2 falls in April 2024  at [employer] Bacteremic with Enterobacter Cloacae. Finished rehab at [facility] GDMT includes Entresto  24/26 mg , Jardiance  10 mg Lasix 40 mg and Toprol  25 mg   Seems to have recovered No angina Volume status ok Had AICD placed by Dr Marven Slimmer 10/18/22 Medtronic DDD ICD   Cardiac MRI done 08/31/22 reviewed  EF 33% LCX scar in mid/basal posterior lateral wall   Unsteady on feet and bangs his arms a lot Stopped ASA due to bleeding on arms from bruises Told him with CAD he needs to be back on it. Has cane and walker for stability  Contacted PA for surgical clearance cystoscopy with TURP for bladder tumor 05/16/23 [facility] U[facility] Dr Domingo Friend. Told ok to hold plavix  for 5 days  Pathology benign Seems like he's had bladder cancer, prostate cancer  and renal stones PSA 07/05/23 normal   ***  ROS All other systems reviewed and negative except as noted above  Past Medical History:  Diagnosis Date   Alzheimer disease (HCC)    Arthritis    thumb right hand   Ascending aorta dilatation (HCC)    CAD (coronary artery disease)    Cancer (HCC) 2020   Prostrate- tx Radiation   Diabetes mellitus    type II, not taking medications - 03/31/20   DM (diabetes mellitus) (HCC)    DVT (deep venous thrombosis) (HCC)    ED (erectile dysfunction)    Hammer toe    History of kidney stones    x 3   Hyperlipidemia    Hypertension    Hypokalemia    Insomnia    Ischemic dilated cardiomyopathy (HCC)    Knee effusion    Lipid disorder    Malignant neoplasm of prostate (HCC)    Memory loss    Metatarsalgia    left foot   Metatarsalgia, left foot    Mild renal insufficiency    Mobitz type 1 second degree AV block    Muscle spasm of back    PAC (premature atrial contraction)    Pseudogout    Pulmonary nodule    Recurrent falls    Rosacea    Scoliosis    Substance abuse (HCC)  11/17/2005   former alcoholic    Tobacco use disorder    Trimalleolar fracture    Unsteady gait    Urinary hesitancy     Family History  Problem Relation Age of Onset   Colon cancer Other 70   Prostate cancer Father    Diabetes Mellitus II Father    Liver disease Neg Hx    Esophageal cancer Neg Hx    Pancreatic cancer Neg Hx    Stomach cancer Neg Hx     Social History   Socioeconomic History   Marital status: Married    Spouse name: Not on file   Number of children: 0   Years of education: Not on file   Highest education level: Not on file  Occupational History   Not on file  Tobacco Use   Smoking status: Some Days    Types: Cigarettes   Smokeless tobacco: Never   Tobacco comments:    1 a week  Vaping Use   Vaping status: Never Used  Substance and Sexual Activity   Alcohol use: No   Drug use: No   Sexual activity: Not Currently  Other  Topics Concern   Not on file  Social History Narrative   Not on file   Social Drivers of Health   Financial Resource Strain: Low Risk  (10/15/2021)   [facility] prior to 06/05/2022[facility] visits prior to 06/05/2022.   Overall Financial Resource Strain (CARDIA)    Difficulty of Paying Living Expenses: Not hard at all  Food Insecurity: Low Risk  (04/21/2022)   [facility]   Hunger Vital Sign    Worried About Running Out of Food in the Last Year: Never true    Within the past 12 months, the food you bought just didn't last and you didn't have money to get more: Not on file  Transportation Needs: Not on file (04/21/2022)  Physical Activity: Inactive (10/15/2021)   [facility], [facility] visits prior to 06/05/2022., [facility] visits prior to 06/05/2022.   Exercise Vital Sign    Days of Exercise per Week: 0 days    Minutes of Exercise per Session: 10 min  Stress: No Stress Concern Present (10/15/2021)   [facility] prior to 06/05/2022., [facility] visits prior to 06/05/2022.   [employer] of Occupational Health - Occupational Stress Questionnaire    Feeling of Stress : Only a little  Social Connections: Moderately Integrated (10/15/2021)   [facility] visits prior to 06/05/2022., [facility] visits prior to 06/05/2022.   Social Advertising account executive [NHANES]    Frequency of Communication with Friends and Family: Once a week    Frequency of Social Gatherings with Friends and Family: Twice a week    Attends Religious Services: 1 to 4 times per year    Active Member of [employer] or Organizations: No    Attends  Banker Meetings: Never    Marital Status: Married  Catering manager Violence: Low Risk  (03/14/2022)   [facility] prior to 06/05/2022., [facility] visits prior to 06/05/2022.   Safety    How often does anyone, including family  and friends, physically hurt you?: Never    How often does anyone, including family and friends, insult or talk down to you?: Never    How often does anyone, including family and friends, threaten you with harm?: Never    How often does anyone, including family and friends, scream or curse at you?: Never    Past Surgical History:  Procedure Laterality Date   COLONOSCOPY     FOOT FRACTURE SURGERY Bilateral    FOOT SURGERY Left 12/2018   ICD IMPLANT N/A 10/18/2022   Procedure: ICD IMPLANT;  Surgeon: Boyce Byes, MD;  Location[facility] INVASIVE CV LAB;  Service: Cardiovascular;  Laterality: N/A;   LITHOTRIPSY     MOUTH SURGERY  1990   ORIF ANKLE FRACTURE Right 06/25/2021   Procedure: OPEN TREATMENT OF RIGHT SUBACUTE TRIMALLEOLAR ANKLE FRACTURE;  Surgeon: Donnamarie Gables, MD;  Location: [facility] OR;  Service: Orthopedics;  Laterality: Right;  LENGHT OF SURGERY: 90 MINUTES   ORIF ELBOW FRACTURE Left 04/01/2020   Procedure: OPEN REDUCTION INTERNAL FIXATION (ORIF) SUPRACONDYLAR ELBOW  FRACTURE, OLECRANON OSTEOTOMY AND BURSECTOMY;  Surgeon: Ronn Cohn, MD;  Location: [facility] OR;  Service: Orthopedics;  Laterality: Left;   ROTATOR CUFF REPAIR Right 2018   SYNDESMOSIS REPAIR Right 06/25/2021   Procedure: SYNDESMOSIS;  Surgeon: Donnamarie Gables, MD;  Location: [facility] OR;  Service: Orthopedics;  Laterality: Right;   ULNAR NERVE TRANSPOSITION Left 04/01/2020   Procedure: ULNAR NERVE RELEASE AND TRANSPOSITION;  Surgeon: Ronn Cohn, MD;  Location: [facility] OR;  Service: Orthopedics;  Laterality: Left;   VASECTOMY  1984      Current Outpatient Medications:    aspirin  EC 81 MG tablet, Take 81 mg by mouth  in the morning. Swallow whole., Disp: , Rfl:    CINNAMON PO, Take 700 mg by mouth in the morning., Disp: , Rfl:    diphenhydramine-acetaminophen  (TYLENOL  PM) 25-500 MG TABS tablet, Take 2 tablets by mouth at bedtime as needed (sleep)., Disp: , Rfl:    doxycycline (VIBRA-TABS) 100 MG tablet, Take 100 mg by mouth daily as needed (rosacea outbreak)., Disp: , Rfl:    empagliflozin  (JARDIANCE ) 10 MG TABS tablet, TAKE 1 TABLET(10 MG) BY MOUTH DAILY, Disp: 90 tablet, Rfl: 2   folic acid (FOLVITE) 1 MG tablet, Take 1 mg by mouth in the morning., Disp: , Rfl:    furosemide (LASIX) 40 MG tablet, Take 1 tablet by mouth daily., Disp: , Rfl:    latanoprost (XALATAN) 0.005 % ophthalmic solution, Place 1 drop into both eyes at bedtime., Disp: , Rfl:    loperamide (IMODIUM) 2 MG capsule, Take 2 mg by mouth in the morning., Disp: , Rfl:    memantine  (NAMENDA ) 5 MG tablet, Take 5 mg by mouth in the morning., Disp: , Rfl:    metoprolol  succinate (TOPROL -XL) 25 MG 24 hr tablet, Take 25 mg by mouth in the morning., Disp: , Rfl:    Multiple Vitamin (MULTI-VITAMINS) TABS, Take 1 tablet by mouth in the morning., Disp: , Rfl:    naproxen (NAPROSYN) 500 MG tablet, Take 500 mg by mouth daily as needed for moderate pain., Disp: , Rfl:    Omega 3 1000 MG CAPS, Take 2,000 mg by mouth in the morning., Disp: , Rfl:    ONETOUCH DELICA LANCETS 33G MISC, USE ONE LANCET DAILY TO TEST BLOOD GLUCOSE, Disp: , Rfl: 3   ONETOUCH VERIO test strip, USE ONE STRIP DAILY TO TEST BLOOD GLUCOSE., Disp: , Rfl: 3   oxyCODONE  (OXY IR/ROXICODONE )  5 MG immediate release tablet, Take 5 mg by mouth 2 (two) times daily as needed (pain)., Disp: , Rfl:    potassium chloride  SA (KLOR-CON ) 20 MEQ tablet, Take 20 mEq by mouth in the morning., Disp: , Rfl:    Probiotic Product (ALIGN PO), Take 1 tablet by mouth in the morning., Disp: , Rfl:    rosuvastatin  (CRESTOR ) 20 MG tablet, Take 20 mg by mouth at bedtime., Disp: , Rfl:    sacubitril-valsartan  (ENTRESTO ) 24-26 MG, Take 1 tablet by mouth 2 (two) times daily. (Patient taking differently: Take 1 tablet by mouth in the morning.), Disp: 60 tablet, Rfl: 11   spironolactone  (ALDACTONE ) 25 MG tablet, TAKE 1 TABLET(25 MG) BY MOUTH DAILY, Disp: 90 tablet, Rfl: 2   TURMERIC PO, Take 1 tablet by mouth in the morning., Disp: , Rfl:    vitamin B-12 (CYANOCOBALAMIN ) 1000 MCG tablet, Take 1,000 mcg by mouth in the morning., Disp: , Rfl:    vitamin C (ASCORBIC ACID ) 250 MG tablet, Take 250 mg by mouth in the morning., Disp: , Rfl:    zinc gluconate 50 MG tablet, Take 50 mg by mouth in the morning., Disp: , Rfl:     Physical Exam: There were no vitals taken for this visit.    Affect appropriate Chronically ill male  HEENT: normal Neck supple with no adenopathy JVP normal no bruits no thyromegaly Lungs clear with no wheezing and good diaphragmatic motion Heart:  S1/S2 no murmur, no rub, gallop or click PMI normal AICD under left clavicle  Abdomen: benighn, BS positve, no tenderness, no AAA no bruit.  No HSM or HJR Distal pulses intact with no bruits No edema Neuro non-focal Skin warm and dry No muscular weakness   Labs:   Lab Results  Component Value Date   WBC 5.3 10/13/2022   HGB 14.5 10/13/2022   HCT 44.5 10/13/2022   MCV 94 10/13/2022   PLT 169 10/13/2022   No results for input(s): "NA", "K", "CL", "CO2", "BUN", "CREATININE", "CALCIUM ", "PROT", "BILITOT", "ALKPHOS", "ALT", "AST", "GLUCOSE" in the last 168 hours.  Invalid input(s): "LABALBU" Lab Results  Component Value Date   CKTOTAL 401 (H) 04/19/2021   No results found for: "CHOL" No results found for: "HDL" No results found for: "LDLCALC" No results found for: "TRIG" No results found for: "CHOLHDL" No results found for: "LDLDIRECT"    Radiology: No results found.  EKG: 09/23/21 SR rate 73 nonspecific ST changes   ASSESSMENT AND PLAN:   CAD:  LCX stent 01/09/21 with 50% residual RCA. Continue beta blocker ASA  and statin no angina  Ischemic DCM:   On good GDMT Cardiac MRI done 08/31/22 EF 33%  with LCX scar received Medtronic DDD AICD  10/18/22  Smoking:  needs f/u lung cancer CT f12/2025 5.7 mm nodule and some mediastinal lymph odes 11 mm note made to use contrast for f/u scan Dementia:  continue dementia with age may have some bearing on primary prevention AICD Urology:  bladder tumor Sees [facility] urology ***   TTE for ischemic DCM F/U EP for AICD Lung cancer CT but with contrast given hilar adenopathy   F/U in a year   Signed: Janelle Mediate 08/19/2023, 3:41 PM

## 2023-08-26 ENCOUNTER — Ambulatory Visit: Payer: Medicare Other | Attending: Cardiovascular Disease | Admitting: Cardiovascular Disease

## 2023-08-31 NOTE — Addendum Note (Signed)
 Addended by: Lott Rouleau A on: 08/31/2023 03:20 PM   Modules accepted: Orders

## 2023-08-31 NOTE — Progress Notes (Signed)
 Remote ICD transmission.

## 2023-10-25 ENCOUNTER — Other Ambulatory Visit: Payer: Self-pay | Admitting: Cardiovascular Disease

## 2023-12-06 ENCOUNTER — Ambulatory Visit (INDEPENDENT_AMBULATORY_CARE_PROVIDER_SITE_OTHER)

## 2023-12-06 DIAGNOSIS — I255 Ischemic cardiomyopathy: Secondary | ICD-10-CM | POA: Diagnosis not present

## 2023-12-08 LAB — CUP PACEART REMOTE DEVICE CHECK
Battery Remaining Longevity: 128 mo
Battery Voltage: 3.02 V
Brady Statistic RV Percent Paced: 0.14 %
Date Time Interrogation Session: 20250902101232
HighPow Impedance: 65 Ohm
Implantable Lead Connection Status: 753985
Implantable Lead Connection Status: 753985
Implantable Lead Implant Date: 20240715
Implantable Lead Implant Date: 20240715
Implantable Lead Location: 753859
Implantable Lead Location: 753860
Implantable Lead Model: 5076
Implantable Lead Model: 6935
Implantable Pulse Generator Implant Date: 20240715
Lead Channel Impedance Value: 380 Ohm
Lead Channel Impedance Value: 456 Ohm
Lead Channel Impedance Value: 475 Ohm
Lead Channel Pacing Threshold Amplitude: 0.625 V
Lead Channel Pacing Threshold Amplitude: 0.625 V
Lead Channel Pacing Threshold Pulse Width: 0.4 ms
Lead Channel Pacing Threshold Pulse Width: 0.4 ms
Lead Channel Sensing Intrinsic Amplitude: 12.6 mV
Lead Channel Sensing Intrinsic Amplitude: 5.1 mV
Lead Channel Setting Pacing Amplitude: 1.5 V
Lead Channel Setting Pacing Amplitude: 1.5 V
Lead Channel Setting Pacing Pulse Width: 0.4 ms
Lead Channel Setting Sensing Sensitivity: 0.3 mV
Zone Setting Status: 755011
Zone Setting Status: 755011

## 2023-12-10 ENCOUNTER — Ambulatory Visit: Payer: Self-pay | Admitting: Cardiology

## 2023-12-14 NOTE — Progress Notes (Signed)
Remote ICD Transmission.

## 2023-12-27 ENCOUNTER — Telehealth: Payer: Self-pay | Admitting: Cardiovascular Disease

## 2023-12-27 NOTE — Telephone Encounter (Signed)
 Pt's wife called to inform that pt has passed away

## 2023-12-27 NOTE — Telephone Encounter (Signed)
 Called pt spouse offered condolences from the [facility] team.   Spouse reports pt died from a heart attack at home.  Advised will send to providers.    Obituary noted with [employer]-2024/01/02.  Sympathy card mailed out to family.   Device appointments and recalls for Cardiology canceled.    Message sent to providers, device clinic and HIM pool.

## 2024-01-04 DEATH — deceased

## 2024-01-28 ENCOUNTER — Other Ambulatory Visit: Payer: Self-pay | Admitting: Cardiovascular Disease

## 2024-03-06 ENCOUNTER — Encounter

## 2024-06-05 ENCOUNTER — Encounter

## 2024-09-04 ENCOUNTER — Encounter
